# Patient Record
Sex: Female | Born: 1935 | Race: White | Hispanic: No | Smoking: Never smoker
Health system: Southern US, Community
[De-identification: ages and names within clinical notes are randomized; demographics above are authoritative.]

## PROBLEM LIST (undated history)

## (undated) DIAGNOSIS — G2581 Restless legs syndrome: Secondary | ICD-10-CM

## (undated) DIAGNOSIS — I878 Other specified disorders of veins: Secondary | ICD-10-CM

## (undated) DIAGNOSIS — E785 Hyperlipidemia, unspecified: Secondary | ICD-10-CM

## (undated) DIAGNOSIS — I214 Non-ST elevation (NSTEMI) myocardial infarction: Secondary | ICD-10-CM

## (undated) DIAGNOSIS — E119 Type 2 diabetes mellitus without complications: Secondary | ICD-10-CM

## (undated) DIAGNOSIS — R42 Dizziness and giddiness: Secondary | ICD-10-CM

## (undated) DIAGNOSIS — M1990 Unspecified osteoarthritis, unspecified site: Secondary | ICD-10-CM

## (undated) DIAGNOSIS — I2510 Atherosclerotic heart disease of native coronary artery without angina pectoris: Secondary | ICD-10-CM

## (undated) DIAGNOSIS — N3941 Urge incontinence: Secondary | ICD-10-CM

## (undated) DIAGNOSIS — K219 Gastro-esophageal reflux disease without esophagitis: Secondary | ICD-10-CM

## (undated) DIAGNOSIS — Z8673 Personal history of transient ischemic attack (TIA), and cerebral infarction without residual deficits: Secondary | ICD-10-CM

## (undated) DIAGNOSIS — M8580 Other specified disorders of bone density and structure, unspecified site: Secondary | ICD-10-CM

## (undated) DIAGNOSIS — D649 Anemia, unspecified: Secondary | ICD-10-CM

## (undated) DIAGNOSIS — I5042 Chronic combined systolic (congestive) and diastolic (congestive) heart failure: Secondary | ICD-10-CM

## (undated) DIAGNOSIS — K449 Diaphragmatic hernia without obstruction or gangrene: Secondary | ICD-10-CM

## (undated) DIAGNOSIS — I10 Essential (primary) hypertension: Secondary | ICD-10-CM

## (undated) DIAGNOSIS — E1121 Type 2 diabetes mellitus with diabetic nephropathy: Secondary | ICD-10-CM

## (undated) DIAGNOSIS — R58 Hemorrhage, not elsewhere classified: Secondary | ICD-10-CM

## (undated) DIAGNOSIS — I639 Cerebral infarction, unspecified: Secondary | ICD-10-CM

## (undated) HISTORY — PX: LUMBAR LAMINECTOMY: SHX95

## (undated) HISTORY — DX: Hyperlipidemia, unspecified: E78.5

## (undated) HISTORY — DX: Restless legs syndrome: G25.81

## (undated) HISTORY — DX: Anemia, unspecified: D64.9

## (undated) HISTORY — PX: KNEE ARTHROSCOPY: SUR90

## (undated) HISTORY — PX: SPINE SURGERY: SHX786

## (undated) HISTORY — DX: Diaphragmatic hernia without obstruction or gangrene: K44.9

## (undated) HISTORY — DX: Type 2 diabetes mellitus without complications: E11.9

## (undated) HISTORY — DX: Unspecified osteoarthritis, unspecified site: M19.90

## (undated) HISTORY — DX: Dizziness and giddiness: R42

## (undated) HISTORY — DX: Urge incontinence: N39.41

## (undated) HISTORY — DX: Other specified disorders of bone density and structure, unspecified site: M85.80

## (undated) HISTORY — DX: Gastro-esophageal reflux disease without esophagitis: K21.9

## (undated) HISTORY — DX: Other specified disorders of veins: I87.8

## (undated) HISTORY — DX: Non-ST elevation (NSTEMI) myocardial infarction: I21.4

## (undated) HISTORY — DX: Chronic combined systolic (congestive) and diastolic (congestive) heart failure: I50.42

## (undated) HISTORY — DX: Cerebral infarction, unspecified: I63.9

## (undated) HISTORY — DX: Personal history of transient ischemic attack (TIA), and cerebral infarction without residual deficits: Z86.73

## (undated) HISTORY — DX: Atherosclerotic heart disease of native coronary artery without angina pectoris: I25.10

## (undated) HISTORY — DX: Type 2 diabetes mellitus with diabetic nephropathy: E11.21

## (undated) HISTORY — DX: Essential (primary) hypertension: I10

---

## 1978-07-27 HISTORY — PX: TOTAL ABDOMINAL HYSTERECTOMY: SHX209

## 1986-07-27 HISTORY — PX: BLADDER REPAIR: SHX76

## 1989-07-27 HISTORY — PX: NECK SURGERY: SHX720

## 1991-07-28 DIAGNOSIS — Z8673 Personal history of transient ischemic attack (TIA), and cerebral infarction without residual deficits: Secondary | ICD-10-CM

## 1991-07-28 HISTORY — DX: Personal history of transient ischemic attack (TIA), and cerebral infarction without residual deficits: Z86.73

## 2006-05-27 LAB — HM MAMMOGRAPHY: HM Mammogram: NORMAL

## 2011-09-01 ENCOUNTER — Ambulatory Visit (INDEPENDENT_AMBULATORY_CARE_PROVIDER_SITE_OTHER): Payer: MEDICARE | Admitting: Internal Medicine

## 2011-09-01 ENCOUNTER — Encounter: Payer: Self-pay | Admitting: Internal Medicine

## 2011-09-01 VITALS — BP 160/50 | HR 72 | Temp 97.4°F | Wt 184.0 lb

## 2011-09-01 DIAGNOSIS — I2510 Atherosclerotic heart disease of native coronary artery without angina pectoris: Secondary | ICD-10-CM | POA: Insufficient documentation

## 2011-09-01 DIAGNOSIS — E118 Type 2 diabetes mellitus with unspecified complications: Secondary | ICD-10-CM

## 2011-09-01 DIAGNOSIS — I10 Essential (primary) hypertension: Secondary | ICD-10-CM | POA: Insufficient documentation

## 2011-09-01 DIAGNOSIS — G2581 Restless legs syndrome: Secondary | ICD-10-CM | POA: Insufficient documentation

## 2011-09-01 DIAGNOSIS — M19019 Primary osteoarthritis, unspecified shoulder: Secondary | ICD-10-CM

## 2011-09-01 DIAGNOSIS — I219 Acute myocardial infarction, unspecified: Secondary | ICD-10-CM

## 2011-09-01 DIAGNOSIS — M12819 Other specific arthropathies, not elsewhere classified, unspecified shoulder: Secondary | ICD-10-CM | POA: Insufficient documentation

## 2011-09-01 MED ORDER — AMLODIPINE BESYLATE 10 MG PO TABS: 10.0000 mg | ORAL_TABLET | Freq: Every day | ORAL | Status: DC

## 2011-09-01 NOTE — Progress Notes (Signed)
Subjective:    Patient ID: Sharon [name], female    DOB: May 03, 1916, 78 y.o.   MRN: [id number]  HPI    Review of Systems     Objective:   Physical Exam        Assessment & Plan:   Coronary artery disease, occlusive In my prior cath 3 years ago during admission for exertional angina. Her disease is managed medically due to renal insufficiency and age. I have discussed with the son that there are no medications on her list that can be suspended at this time. She takes both Ranexa and Imdur for  management of ischemic heart disease. Ejection fraction is unknown but by history she has class III heart failure with no symptoms at rest with some symptoms with exertion. She was previously followed by cardiology Dr. Marigene Ehlers her but given the optimization of her medications I agree with him that there is no immediately for cardiology follow up here.  Diabetes mellitus type 2 with complications With nephropathy, renal insufficiency, and coronary artery disease. Her last hemoglobin A1c was elevated at 8.0 3 months ago. She has been taking  Lantus with good control of fasting blood sugars. She is on the appropriate medications including aspirin statin but no ACE inhibitor or ARB for unclear reasons. We will repeat her A1c today.  Rotator cuff arthropathy Chronic with periodic flareups involving pain and limited range of motion. She is wearing a sling for her right shoulder but patient preference. I've cautioned her that continued use of the sling without physical therapy will result frozen shoulder. Her son will help her to stretching exercises and pendulum swings to prevent this. 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   Hypertension Currently uncontrolled on Toprol-XL Catapres patch hydralazine and furosemide. For simplification Will stop the hydralazine which he is taking 4 times a day,  and initiate trial of amlodipine 10 mg daily.  Restless leg syndrome Managed with pramipexole. Condition is stable she's not having a trouble sleeping. No changes today.    Updated Medication List Outpatient Encounter Prescriptions as of 09/01/2011  Medication Sig Dispense Refill  . aspirin 81 MG tablet Take 81 mg by mouth daily.      Marland Kitchen atorvastatin (LIPITOR) 20 MG tablet Take 20 mg by mouth daily.      . cloNIDine (CATAPRES - DOSED IN MG/24 HR) 0.3 mg/24hr Place 1 patch onto the skin once a week.      . furosemide (LASIX) 40 MG tablet Take 40 mg by mouth daily.      . insulin glargine (LANTUS) 100 UNIT/ML injection Inject 18 units daily      . isosorbide mononitrate (IMDUR) 60 MG 24 hr tablet Take one and one half by mouth daily      . metoprolol tartrate (LOPRESSOR) 25 MG tablet Take 25 mg by mouth 3 (three) times daily.      Marland Kitchen omeprazole (PRILOSEC) 20 MG capsule Take 20 mg by mouth daily.      . pramipexole (MIRAPEX) 0.25 MG tablet Take 0.25 mg by mouth 4 (four) times daily.       . ranolazine (RANEXA) 500 MG 12 hr tablet Take 500 mg by mouth 2 (two) times daily.      Marland Kitchen DISCONTD: hydrALAZINE (APRESOLINE) 25 MG tablet Take 25 mg by mouth 4 (four) times daily.      Marland Kitchen DISCONTD: metoprolol succinate (  TOPROL-XL) 100 MG 24 hr tablet Take one half tablet by mouth daily      . amLODipine (NORVASC) 10 MG tablet Take 1 tablet (10 mg total) by mouth daily.  30 tablet  3  . mometasone (NASONEX) 50 MCG/ACT nasal spray Place 2 sprays into the nose daily.      Marland Kitchen DISCONTD: simvastatin (ZOCOR) 40 MG tablet Take 40 mg by mouth every evening.

## 2011-09-01 NOTE — Assessment & Plan Note (Signed)
Managed with pramipexole. Condition is stable she's not having a trouble sleeping. No changes today.

## 2011-09-01 NOTE — Assessment & Plan Note (Signed)
Currently uncontrolled on Toprol-XL Catapres patch hydralazine and furosemide. For simplification Will stop the hydralazine which he is taking 4 times a day, and initiate trial of amlodipine 10 mg daily.

## 2011-09-01 NOTE — Assessment & Plan Note (Addendum)
With nephropathy, renal insufficiency, and coronary artery disease. Her last hemoglobin A1c was elevated at 8.0 3 months ago. She has been taking  Lantus with good control of fasting blood sugars. She is on the appropriate medications including aspirin statin but no ACE inhibitor or ARB for unclear reasons. We will repeat her A1c today.

## 2011-09-01 NOTE — Assessment & Plan Note (Signed)
Chronic with periodic flareups involving pain and limited range of motion. She is wearing a sling for her right shoulder but patient preference. I've cautioned her that continued use of the sling without physical therapy will result frozen shoulder. Her son will help her to stretching exercises and pendulum swings to prevent this. 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

## 2011-09-01 NOTE — Progress Notes (Signed)
  Subjective:    Patient ID: Sharon [name], female    DOB: 08/09/1915, 78 y.o.   MRN: [id number]  HPI  New patient ,  Woke up with shoulder pain , right side chroic recurrent due to rotator cuff .  Wearing a sling today.  Relocated form [place] area in November,  Living with son [name] at [facility],  Has lost 8 lbs  Through walking and better diet.  Right knee pain ,  Chronic treated with steroid injections ,  Son requesting referral to [facility].  Histoor of macular degeneration ,  Set up with [facility] Dr. Renaee Munda.  Cardiac history ;  2010; admitted to hospital with  chf secondary to 3 vessel disease in LAD, not stentable, recommended CABG at age 34 . But decompensated with renal failure, so medically managed by PCP and cardiologist Naseer , requesting centralization of management by me.   Gave up driving 3 months ago  But performing all ADLs .  Wears a hearing  aid.  Uses Walker into shower.  Wears Art therapist.  Has a home health aide scheduled to come out during son's absence for a few days.   Now on Lantus only for management of diabetes 20 units daily .  Needs podiatry referral for diabbetic foot care.    Review of Systems  Constitutional: Negative for fever, chills and unexpected weight change.  HENT: Negative for hearing loss, ear pain, nosebleeds, congestion, sore throat, facial swelling, rhinorrhea, sneezing, mouth sores, trouble swallowing, neck pain, neck stiffness, voice change, postnasal drip, sinus pressure, tinnitus and ear discharge.   Eyes: Negative for pain, discharge, redness and visual disturbance.  Respiratory: Negative for cough, chest tightness, shortness of breath, wheezing and stridor.   Cardiovascular: Negative for chest pain, palpitations and leg swelling.  Musculoskeletal: Positive for arthralgias. Negative for myalgias.  Skin: Negative for color change and rash.  Neurological: Negative for dizziness, weakness, light-headedness and headaches.  Hematological:  Negative for adenopathy.       Objective:   Physical Exam  Constitutional: She is oriented to person, place, and time. She appears well-developed and well-nourished.  HENT:  Mouth/Throat: Oropharynx is clear and moist.  Eyes: EOM are normal. Pupils are equal, round, and reactive to light. No scleral icterus.  Neck: Normal range of motion. Neck supple. No JVD present. No thyromegaly present.  Cardiovascular: Normal rate, regular rhythm, normal heart sounds and intact distal pulses.   Pulmonary/Chest: Effort normal and breath sounds normal.  Abdominal: Soft. Bowel sounds are normal. She exhibits no mass. There is no tenderness.  Musculoskeletal: Normal range of motion. She exhibits no edema.  Lymphadenopathy:    She has no cervical adenopathy.  Neurological: She is alert and oriented to person, place, and time.  Skin: Skin is warm and dry.  Psychiatric: She has a normal mood and affect.          Assessment & Plan:

## 2011-09-01 NOTE — Assessment & Plan Note (Signed)
In my prior cath 3 years ago during admission for exertional angina. Her disease is managed medically due to renal insufficiency and age. I have discussed with the son that there are no medications on her list that can be suspended at this time. She takes both Ranexa and Imdur for  management of ischemic heart disease. Ejection fraction is unknown but by history she has class III heart failure with no symptoms at rest with some symptoms with exertion. She was previously followed by cardiology Dr. Marigene Ehlers her but given the optimization of her medications I agree with him that there is no immediately for cardiology follow up here.

## 2011-09-01 NOTE — Patient Instructions (Signed)
We will make one change today:  Replace hydralazine with once daily amlodipine 10 mg one tablet daily   We are checking kidney function and diabetes control today.

## 2011-09-02 LAB — COMPREHENSIVE METABOLIC PANEL
Albumin: 3.6 g/dL (ref 3.5–5.2)
Alkaline Phosphatase: 82 U/L (ref 39–117)
BUN: 31 mg/dL — ABNORMAL HIGH (ref 6–23)
CO2: 29 mEq/L (ref 19–32)
Calcium: 8.8 mg/dL (ref 8.4–10.5)
GFR: 22.22 mL/min — ABNORMAL LOW (ref >60.00)
Glucose, Bld: 170 mg/dL — ABNORMAL HIGH (ref 70–99)
Potassium: 4.2 mEq/L (ref 3.5–5.1)
Sodium: 140 mEq/L (ref 135–145)
Total Protein: 6.1 g/dL (ref 6.0–8.3)

## 2011-09-10 ENCOUNTER — Telehealth: Payer: Self-pay | Admitting: *Deleted

## 2011-09-10 DIAGNOSIS — M25519 Pain in unspecified shoulder: Secondary | ICD-10-CM

## 2011-09-10 NOTE — Telephone Encounter (Signed)
Pt saw Dr. Hyacinth Meeker and was given a steroid injection her shoulder.  She wanted to start her on anti inflammatory but pt told him you had told her not to take anti inflammatories.  Son is asking for your opinion on this.  Please advise.

## 2011-09-11 MED ORDER — PREDNISONE (PAK) 10 MG PO TABS: ORAL_TABLET | ORAL | Status: AC

## 2011-09-11 NOTE — Telephone Encounter (Signed)
Advised pt's son.

## 2011-09-11 NOTE — Telephone Encounter (Signed)
Advised pt's son.  He said that Dr. Hyacinth Meeker wants you to prescribe what you think would be best for the patient to take, since you are her PCP.  Uses [facility].

## 2011-09-11 NOTE — Telephone Encounter (Signed)
I recommended No NSAIDs because they can worsen her  kidney disease.  prednsione ok to take thought for short periods

## 2011-09-11 NOTE — Telephone Encounter (Signed)
i have sent a prednisone taper to her [facility],  She may take tylenol 500 mg every hours along with the prednisone.  plesae explain the taper to the son,

## 2011-10-12 ENCOUNTER — Other Ambulatory Visit: Payer: Self-pay | Admitting: Internal Medicine

## 2011-10-12 MED ORDER — PRAMIPEXOLE DIHYDROCHLORIDE 0.25 MG PO TABS: 0.2500 mg | ORAL_TABLET | Freq: Four times a day (QID) | ORAL | Status: DC

## 2011-10-12 NOTE — Telephone Encounter (Signed)
Patient is needing a refill on he rPramipexole Di Hcl 0.25 mg

## 2011-10-12 NOTE — Telephone Encounter (Signed)
Rx has been sent  

## 2011-10-26 ENCOUNTER — Telehealth: Payer: Self-pay | Admitting: Internal Medicine

## 2011-10-26 ENCOUNTER — Ambulatory Visit (INDEPENDENT_AMBULATORY_CARE_PROVIDER_SITE_OTHER): Payer: MEDICARE | Admitting: Internal Medicine

## 2011-10-26 ENCOUNTER — Encounter: Payer: Self-pay | Admitting: Internal Medicine

## 2011-10-26 VITALS — BP 130/52 | HR 68 | Temp 98.5°F | Resp 18 | Wt 190.5 lb

## 2011-10-26 DIAGNOSIS — I10 Essential (primary) hypertension: Secondary | ICD-10-CM

## 2011-10-26 DIAGNOSIS — R609 Edema, unspecified: Secondary | ICD-10-CM

## 2011-10-26 DIAGNOSIS — R600 Localized edema: Secondary | ICD-10-CM

## 2011-10-26 DIAGNOSIS — Z79899 Other long term (current) drug therapy: Secondary | ICD-10-CM

## 2011-10-26 DIAGNOSIS — R0609 Other forms of dyspnea: Secondary | ICD-10-CM

## 2011-10-26 LAB — TSH: TSH: 1.24 u[IU]/mL (ref 0.35–5.50)

## 2011-10-26 MED ORDER — AZILSARTAN MEDOXOMIL 40 MG PO TABS: 1.0000 | ORAL_TABLET | Freq: Every day | ORAL | Status: DC

## 2011-10-26 MED ORDER — FUROSEMIDE 40 MG PO TABS: 80.0000 mg | ORAL_TABLET | Freq: Every day | ORAL | Status: DC

## 2011-10-26 NOTE — Progress Notes (Signed)
Patient ID: Sharon [name], female   DOB: 08-31-1915, 78 y.o.   MRN: [id number]  Patient Active Problem List  Diagnoses  . Coronary artery disease, occlusive  . Diabetes mellitus type 2 with complications  . Rotator cuff arthropathy  . Hypertension  . Restless leg syndrome  . Edema of lower extremity  . Chronic kidney disease (CKD), stage III (moderate)    Subjective:  CC:   Chief Complaint  Patient presents with  . Leg Swelling    HPI:   Sharon Woerner Christmasis a 78 y.o. female who presents with bilateral lower extremity edema.  She has had 6 lb wt gain in the past 7 days and her abdomen feels distended.  She denies orthopnea and chest pain.Marland Kitchen  Has chronic dyspnea on exertion.  She takes  amlodiine and imdur which both may be contributing. She was inactive and sedentary while son was out of town. She has increased the furosemide to 80 mg daily for 3 days which has increased her urine output.    Past Medical History  Diagnosis Date  . Diabetes mellitus, type 2   . Diabetic nephropathy   . GERD (gastroesophageal reflux disease)   . Essential hypertension, benign   . Coronary atherosclerosis of native coronary artery   . Venous stasis   . Osteoarthritis     with chronic back pain  . Osteopenia   . Urge incontinence     mild  . History of CVA (cerebrovascular accident) 38  . Vitamin d deficiency   . Hyperlipidemia   . Vertigo   . Restless leg syndrome   . Anemia     Past Surgical History  Procedure Date  . Bladder repair 1988  . Total abdominal hysterectomy 1980  . Neck surgery 1991  . Lumbar laminectomy   . Knee arthroscopy   . Spine surgery     lumbar laminectomy         The following portions of the patient's history were reviewed and updated as appropriate: Allergies, current medications, and problem list.    Review of Systems:   12 Pt  review of systems was negative except those addressed in the HPI,     History   Social History  . Marital  Status: Widowed    Spouse Name: N/A    Number of Children: N/A  . Years of Education: N/A   Occupational History  . Not on file.   Social History Main Topics  . Smoking status: Never Smoker   . Smokeless tobacco: Never Used  . Alcohol Use: No  . Drug Use: No  . Sexually Active: Not on file   Other Topics Concern  . Not on file   Social History Narrative  . No narrative on file    Objective:  BP 130/52  Pulse 68  Temp(Src) 98.5 F (36.9 C) (Oral)  Resp 18  Wt 190 lb 8 oz (86.41 kg)  SpO2 94%  General appearance: alert, cooperative and appears stated age Ears: normal TM's and external ear canals both ears Throat: lips, mucosa, and tongue normal; teeth and gums normal Neck: no adenopathy, no carotid bruit, supple, symmetrical, trachea midline and thyroid not enlarged, symmetric, no tenderness/mass/nodules Back: symmetric, no curvature. ROM normal. No CVA tenderness. Lungs: clear to auscultation bilaterally Heart: regular rate and rhythm, S1, S2 normal, no murmur, click, rub or gallop Abdomen: soft, non-tender; bowel sounds normal; no masses,  no organomegaly Pulses: 2+ and symmetric Skin: 2 + pitting edema bilaterally, left leg  erythematous. Lymph nodes: Cervical, supraclavicular, and axillary nodes normal.  Assessment and Plan:  Edema of lower extremity She has chronic venous insufficiency which has been aggravated by inactivity, vasodilator therapy and intolerance of compression stockings . Will increased furosemide to 80 mg once daily, check renal function today,  Stop amlodipine ,  Trial of edarbi (2 weeks of samples given) and ask [facility] to place Unnas boots on patient to manually compress the edema.   Hypertension Well controlled, but the amlodipine may be causing increased LE edema.  Trial of edarbi and suspend amlodipine.   Chronic kidney disease (CKD), stage III (moderate) Cr today was 2.2,  Up from 1.9  Previously secondary to increased diuretics. Will  repeat bmet in one week.    Updated Medication List Outpatient Encounter Prescriptions as of 10/26/2011  Medication Sig Dispense Refill  . amLODipine (NORVASC) 10 MG tablet Take 1 tablet (10 mg total) by mouth daily.  30 tablet  3  . aspirin 81 MG tablet Take 81 mg by mouth daily.      Marland Kitchen atorvastatin (LIPITOR) 20 MG tablet Take 20 mg by mouth daily.      . cloNIDine (CATAPRES - DOSED IN MG/24 HR) 0.3 mg/24hr Place 1 patch onto the skin once a week.      . furosemide (LASIX) 40 MG tablet Take 2 tablets (80 mg total) by mouth daily.  30 tablet  1  . insulin glargine (LANTUS) 100 UNIT/ML injection Inject 18 units daily      . isosorbide mononitrate (IMDUR) 60 MG 24 hr tablet Take one and one half by mouth daily      . metoprolol tartrate (LOPRESSOR) 25 MG tablet Take 25 mg by mouth 3 (three) times daily.      . mometasone (NASONEX) 50 MCG/ACT nasal spray Place 2 sprays into the nose daily.      Marland Kitchen omeprazole (PRILOSEC) 20 MG capsule Take 20 mg by mouth daily.      . pramipexole (MIRAPEX) 0.25 MG tablet Take 1 tablet (0.25 mg total) by mouth 4 (four) times daily.  120 tablet  3  . ranolazine (RANEXA) 500 MG 12 hr tablet Take 500 mg by mouth 2 (two) times daily.      Marland Kitchen DISCONTD: furosemide (LASIX) 40 MG tablet Take 40 mg by mouth daily.      . Azilsartan Medoxomil (EDARBI) 40 MG TABS Take 1 capsule by mouth daily.  30 tablet  0     Orders Placed This Encounter  Procedures  . TSH  . COMPLETE METABOLIC PANEL WITH GFR  . B Nat Peptide  . Ambulatory referral to Home Health    No Follow-up on file.

## 2011-10-26 NOTE — Assessment & Plan Note (Signed)
Well controlled, but the amlodipine may be causing increased LE edema.  Trial of edarbi and suspend amlodipine.

## 2011-10-26 NOTE — Assessment & Plan Note (Signed)
She has chronic venous insufficiency which has been aggravated by inactivity, vasodilator therapy and intolerance of compression stockings . Will increased furosemide to 80 mg once daily, check renal function today,  Stop amlodipine ,  Trial of edarbi (2 weeks of samples given) and ask [facility] to place Unnas boots on patient to manually compress the edema.

## 2011-10-26 NOTE — Telephone Encounter (Signed)
Per Dr. Darrick Huntsman, she does not want to wait until next week for [facility] to see patient.  I will call [facility] in the morning.

## 2011-10-26 NOTE — Patient Instructions (Signed)
Increase the furosemide to 80 mg in the morning  Stop the amlodipine  And add a new bp medication edarbi 1 tablet daily at night   Elevate legs (ankles higher than knees) when sitting  I am going to ask  [facility] to come out to wrap both legs  With compression wraps.    Resume walking as tolerating  [facility] can repeat a BMET in one week

## 2011-10-26 NOTE — Assessment & Plan Note (Signed)
Cr today was 2.2,  Up from 1.9  Previously secondary to increased diuretics. Will repeat bmet in one week.

## 2011-10-26 NOTE — Telephone Encounter (Signed)
Melissa from [facility] called and stated they cannot start care with patient today because they are backed up and it may be next week before they can make the initial visit with patient.  She wanted to know if it was ok to wait.  Please advise.

## 2011-10-27 LAB — COMPLETE METABOLIC PANEL WITH GFR
ALT: 17 U/L (ref 0–35)
AST: 20 U/L (ref 0–37)
Albumin: 3.7 g/dL (ref 3.5–5.2)
Calcium: 9.1 mg/dL (ref 8.4–10.5)
Chloride: 103 mEq/L (ref 96–112)
Potassium: 3.7 mEq/L (ref 3.5–5.3)
Sodium: 139 mEq/L (ref 135–145)
Total Protein: 5.6 g/dL — ABNORMAL LOW (ref 6.0–8.3)

## 2011-10-29 ENCOUNTER — Telehealth: Payer: Self-pay | Admitting: Internal Medicine

## 2011-10-29 NOTE — Telephone Encounter (Signed)
Per Son, [facility] did go out to patient's house to wrap her leg. They came out on MOnday and are due to go back today.  Patient also  has an aptt to see you tomorrow.

## 2011-10-29 NOTE — Telephone Encounter (Signed)
Caller: [facility]/Son; PCP: Duncan Dull; CB#: [phone]; ; ; Call regarding Swelling;   Son states patient was seen in office by Dr. Darrick Huntsman 10/26/11 for increased swelling in legs and weight gain.  States Lasix was increased from 40mg . to 80mg . X 3 days. Son states patient has gained 10 pounds in 2-3 weeks. Son states no weight loss since increasing Lasix. States no increase in urination with Lasix. Denies dyspnea or chest pain. States patient had to sit up to sleep during the night 10/29/11. States bilateral leg swelling extends to knees. States abdomen also appears to be swollen. Triage per Edema Protocol. No emergent sx identified. Care advice given per guidelines. No appts. available in Epic EHR. RN spoke with Erie Noe in office. Informed of disposition of see within 4 hours. Call transferred to [facility] in office for appt. within 4 hours.

## 2011-10-29 NOTE — Telephone Encounter (Signed)
Please confirm that [facility] did send a nurse out to wrap patient's legs in Unnas boots, and please ask them to go back out to see measure her pulses and listen to her lungs to make sure she is not in heart failure.

## 2011-10-29 NOTE — Telephone Encounter (Signed)
Triage Record Num: [id number] Operator: Patriciaann Clan Patient Name: Sharon [name] Call Date & Time: 10/29/2011 8:30:54AM Patient Phone: [phone] PCP: Duncan Dull Patient Gender: Female PCP Fax : [phone] Patient DOB: Sep 07, 1915 Practice Name: [facility] Reason for Call: Caller: [facility]/Son; PCP: Duncan Dull; CB#: [phone]; ; ; Call regarding Swelling; Son states patient was seen in office by Dr. Darrick Huntsman 10/26/11 for increased swelling in legs and weight gain. States Lasix was increased from 40mg . to 80mg . X 3 days. Son states patient has gained 10 pounds in 2-3 weeks. Son states no weight loss since increasing Lasix. States no increase in urination with Lasix. Denies dyspnea or chest pain. States patient had to sit up to sleep during the night 10/29/11. States bilateral leg swelling extends to knees. States abdomen also appears to be swollen. Triage per Edema Protocol. No emergent sx identified. Care advice given per guidelines. No appts. available in Epic EHR. RN spoke with Erie Noe in office. Informed of disposition of see within 4 hours. Call transferred to [facility] in office for appt. within 4 hours. Protocol(s) Used: Edema, Atraumatic Recommended Outcome per Protocol: See Provider within 4 hours Reason for Outcome: New or worsening fatigue, weakness, confusion, or change in function. Care Advice: ~ Avoid any activity that produces symptoms until evaluated by provider. ~ Rest in a reclining chair or elevate head and chest on 2 or 3 pillows when lying down to make breathing easier. Go to ED IMMEDIATELY if developing increased shortness of breath, continuous cough, worsening fatigue, or unable to perform ADLs. ~ ~ IMMEDIATE ACTION 04/

## 2011-10-30 ENCOUNTER — Encounter: Payer: Self-pay | Admitting: Internal Medicine

## 2011-10-30 ENCOUNTER — Telehealth: Payer: Self-pay | Admitting: Internal Medicine

## 2011-10-30 ENCOUNTER — Ambulatory Visit (INDEPENDENT_AMBULATORY_CARE_PROVIDER_SITE_OTHER): Payer: MEDICARE | Admitting: Internal Medicine

## 2011-10-30 VITALS — BP 110/46 | HR 60 | Temp 98.0°F | Resp 18 | Wt 196.5 lb

## 2011-10-30 DIAGNOSIS — I10 Essential (primary) hypertension: Secondary | ICD-10-CM

## 2011-10-30 DIAGNOSIS — R609 Edema, unspecified: Secondary | ICD-10-CM

## 2011-10-30 DIAGNOSIS — N183 Chronic kidney disease, stage 3 unspecified: Secondary | ICD-10-CM

## 2011-10-30 DIAGNOSIS — R600 Localized edema: Secondary | ICD-10-CM

## 2011-10-30 DIAGNOSIS — R0602 Shortness of breath: Secondary | ICD-10-CM

## 2011-10-30 DIAGNOSIS — N184 Chronic kidney disease, stage 4 (severe): Secondary | ICD-10-CM

## 2011-10-30 LAB — BASIC METABOLIC PANEL
BUN: 58 mg/dL — ABNORMAL HIGH (ref 6–23)
CO2: 25 mEq/L (ref 19–32)
Chloride: 102 mEq/L (ref 96–112)
Creatinine, Ser: 2.8 mg/dL — ABNORMAL HIGH (ref 0.4–1.2)
Glucose, Bld: 125 mg/dL — ABNORMAL HIGH (ref 70–99)

## 2011-10-30 MED ORDER — AZILSARTAN MEDOXOMIL 40 MG PO TABS: 1.0000 | ORAL_TABLET | Freq: Every day | ORAL | Status: DC

## 2011-10-30 MED ORDER — METOLAZONE 10 MG PO TABS: 10.0000 mg | ORAL_TABLET | Freq: Every day | ORAL | Status: DC

## 2011-10-30 NOTE — Patient Instructions (Signed)
We are adding zaroxyolyn as a diuretic to boost the effect of furosemide.  Just for today ,  Take Another  80 mg of furosemide preceded by the new drug (30 minutes prior At 12:00 )  Starting tomorrow,  take the z drug first,  Then 2 furosemide (80 mg) 30 minutes later   Referral to nephrology under way

## 2011-10-30 NOTE — Progress Notes (Signed)
Patient ID: Sharon [name], female   DOB: 1915-12-05, 78 y.o.   MRN: [id number] Returns for evaluation of worsening lower extremity edema.  Patient has a history of ischemic cardiomyopathy, class III NYHA CHF, HTN with probable diastolic dysfunction (ECHO report currently unavailable as I am still  waiting for records from her cardiologist), CKD with GFR around 30,  DM,  a chronic LE edema.  Patient was seen earlier in the week for weight gain and increased edema.  By history she had increased her furosemide to 40 mg bid for several days with an increase in urine output but no improvement in weight gain or in edema in legs and abdomen.  On prior  exam she had no signs of of decompensated congestive heart failure on exam.  BNP was < 500,  lung exam was normal and sats were normal. BUN was elevated at 50,.  Cr stable at 2.2   A 6 lb weight gain was noted compared to one month before.  She was prescribed 80 mg of furosmide once daily, amlodipine was stopped (edarbi was started for BP management)  and Home Health RN was enlilsted to wrap both legs in Unnas boots for compression of edema.  Patient returns today with continued weight gain but is short of breath with exertion ,  Not at rest.

## 2011-10-30 NOTE — Telephone Encounter (Signed)
[facility] called patient does not have her edarbi 40 mg and the pharmacy does not supply it at the facility, one of the [facility] staff members will come by the office to pick up samples to supply the patient until she is discharge on Tuesday.

## 2011-11-01 ENCOUNTER — Encounter: Payer: Self-pay | Admitting: Internal Medicine

## 2011-11-01 NOTE — Assessment & Plan Note (Signed)
Amlodipine was stopped several day ago secondary to concerns that it was aggravating her LE edema.  Samples of edarbi were given as a substitute.  GFR has dropped slightly but she has also been taking 80 mg of lasix daily.

## 2011-11-01 NOTE — Assessment & Plan Note (Signed)
Multifactorial, secondary to CKD, systolic dysfunction, probable venous insufficiency, and vasodilator therapy.  Her GFR has decreased with increased use of diuretics .  Adding zaroxolyn ,  Will ask [facility] to give IM lasix for weight gain > 1 lb overnight.  Nephrology referral underway as patient has GFR < 20 currently and desires aggressive care despite her age.

## 2011-11-01 NOTE — Assessment & Plan Note (Addendum)
With no prior nephrology evaluation.  Long discussion with her , son and daughter in law about her fluid balance complicated by CHF and CKD.  She desires aggressive care.  nephrology evaluation made. Cr did bump from 2.24 to 2.8 with stable potassium after addition of Edarbi.  Will repeat BMET on Monday April 8 and if Cr is higher will stop Edarbi and resume amlodipine.

## 2011-11-04 ENCOUNTER — Telehealth: Payer: Self-pay | Admitting: Internal Medicine

## 2011-11-04 ENCOUNTER — Other Ambulatory Visit: Payer: Self-pay | Admitting: Internal Medicine

## 2011-11-04 DIAGNOSIS — R609 Edema, unspecified: Secondary | ICD-10-CM

## 2011-11-04 NOTE — Telephone Encounter (Signed)
Dr Wynelle Link wanted to talk to you  About ms [place] [phone]

## 2011-11-05 ENCOUNTER — Telehealth: Payer: Self-pay | Admitting: Internal Medicine

## 2011-11-05 NOTE — Telephone Encounter (Signed)
Erie Noe from [place] called and wanted to know what size compression stockings did you want patient to wear please advise.

## 2011-11-05 NOTE — Telephone Encounter (Signed)
Whatever she can tolerate,  She probably will not tolerate more than 20-30 mm Hb

## 2011-11-06 NOTE — Telephone Encounter (Signed)
Vanessa notified.  She wanted to let you know patients diastolic BP when she was doing PT was in the 30-40 range, and when she has been to her house it has been in the 50s.   She stated patients pulse has been in the 50s as well.

## 2011-11-11 ENCOUNTER — Other Ambulatory Visit (INDEPENDENT_AMBULATORY_CARE_PROVIDER_SITE_OTHER): Payer: MEDICARE

## 2011-11-11 ENCOUNTER — Other Ambulatory Visit: Payer: Self-pay

## 2011-11-11 DIAGNOSIS — R609 Edema, unspecified: Secondary | ICD-10-CM

## 2011-11-24 ENCOUNTER — Telehealth: Payer: Self-pay | Admitting: Internal Medicine

## 2011-11-24 NOTE — Telephone Encounter (Signed)
Sharon [name] from [facility] called and said patient has not returned her phone calls or let [facility] come to her home in one week, so they will have to discharge her.

## 2011-11-30 ENCOUNTER — Encounter: Payer: Self-pay | Admitting: Internal Medicine

## 2011-11-30 ENCOUNTER — Ambulatory Visit (INDEPENDENT_AMBULATORY_CARE_PROVIDER_SITE_OTHER): Payer: MEDICARE | Admitting: Internal Medicine

## 2011-11-30 VITALS — BP 152/64 | HR 78 | Temp 98.2°F | Resp 18 | Wt 170.8 lb

## 2011-11-30 DIAGNOSIS — E1122 Type 2 diabetes mellitus with diabetic chronic kidney disease: Secondary | ICD-10-CM

## 2011-11-30 DIAGNOSIS — E1129 Type 2 diabetes mellitus with other diabetic kidney complication: Secondary | ICD-10-CM

## 2011-11-30 DIAGNOSIS — I219 Acute myocardial infarction, unspecified: Secondary | ICD-10-CM

## 2011-11-30 DIAGNOSIS — N186 End stage renal disease: Secondary | ICD-10-CM

## 2011-11-30 DIAGNOSIS — N058 Unspecified nephritic syndrome with other morphologic changes: Secondary | ICD-10-CM

## 2011-11-30 DIAGNOSIS — N184 Chronic kidney disease, stage 4 (severe): Secondary | ICD-10-CM

## 2011-11-30 DIAGNOSIS — I2510 Atherosclerotic heart disease of native coronary artery without angina pectoris: Secondary | ICD-10-CM

## 2011-11-30 NOTE — Progress Notes (Signed)
Patient ID: Sharon [name], female   DOB: 1915/10/22, 78 y.o.   MRN: [id number]  Patient Active Problem List  Diagnoses  . Coronary artery disease, occlusive  . Rotator cuff arthropathy  . Hypertension  . Restless leg syndrome  . Edema of lower extremity  . Chronic kidney disease (CKD), stage III (moderate)  . Diabetes mellitus with end-stage renal disease  . Chronic kidney disease (CKD), stage IV (severe)    Subjective:  CC:   Chief Complaint  Patient presents with  . Follow-up    HPI:   Sharon [name] Christmasis a 78 y.o. female who presents Follow up on NYHA Class III heart failure,  CKD,  And edema.  She has seen the nephrologist Kollaru,  twice, who agreed with the aggressive diuresis plan which achieved a 20 lb wt loss and has now reduced her diuretics to once daily furosemide and stopped the zaroxolyn,  ECHO was repeated and is pending.  Kidney ultrasound is scheduled in 2 weeks.  She feels much better, but continues to have some dyspnea with ambulation but not at rest.  Appetite good,  No pain reported.    Past Medical History  Diagnosis Date  . Diabetes mellitus, type 2   . Diabetic nephropathy   . GERD (gastroesophageal reflux disease)   . Essential hypertension, benign   . Coronary atherosclerosis of native coronary artery   . Venous stasis   . Osteoarthritis     with chronic back pain  . Osteopenia   . Urge incontinence     mild  . History of CVA (cerebrovascular accident) 53  . Vitamin d deficiency   . Hyperlipidemia   . Vertigo   . Restless leg syndrome   . Anemia     Past Surgical History  Procedure Date  . Bladder repair 1988  . Total abdominal hysterectomy 1980  . Neck surgery 1991  . Lumbar laminectomy   . Knee arthroscopy   . Spine surgery     lumbar laminectomy         The following portions of the patient's history were reviewed and updated as appropriate: Allergies, current medications, and problem list.    Review of Systems:   12 Pt  review of systems was negative except those addressed in the HPI,     History   Social History  . Marital Status: Widowed    Spouse Name: N/A    Number of Children: N/A  . Years of Education: N/A   Occupational History  . Not on file.   Social History Main Topics  . Smoking status: Never Smoker   . Smokeless tobacco: Never Used  . Alcohol Use: No  . Drug Use: No  . Sexually Active: Not on file   Other Topics Concern  . Not on file   Social History Narrative  . No narrative on file    Objective:  BP 152/64  Pulse 78  Temp(Src) 98.2 F (36.8 C) (Oral)  Resp 18  Wt 170 lb 12 oz (77.452 kg)  SpO2 97%  General appearance: alert, cooperative and appears stated age Ears: normal TM's and external ear canals both ears Throat: lips, mucosa, and tongue normal; teeth and gums normal Neck: no adenopathy, no carotid bruit, supple, symmetrical, trachea midline and thyroid not enlarged, symmetric, no tenderness/mass/nodules Back: symmetric, no curvature. ROM normal. No CVA tenderness. Lungs: clear to auscultation bilaterally Heart: regular rate and rhythm, S1, S2 normal, no murmur, click, rub or gallop Abdomen: soft, non-tender; bowel  sounds normal; no masses,  no organomegaly Pulses: 2+ and symmetric Skin: Skin color, texture, turgor normal. No rashes or lesions Lymph nodes: Cervical, supraclavicular, and axillary nodes normal.  Assessment and Plan:  Diabetes mellitus with end-stage renal disease Her GFR is around 20 and she intends to pursue HD if needed.  Nephrology is following.  Her sugars have been elevated lately , due to optical steroid injections, will increase Lantus to 22 units.  And adjust every 2 weeks.   Chronic kidney disease (CKD), stage IV (severe) Cr has been stable but her GFR is 20.  F/u with nephrology next week for renal ultrasound.   Coronary artery disease, occlusive 3 vessel disease,  CABG recommended by prior cardiologist but deferred due  to age. Severe systolic dysfunction suspected.  Repeat ECHO is pending. She is on the appropriate medications.    Updated Medication List Outpatient Encounter Prescriptions as of 11/30/2011  Medication Sig Dispense Refill  . aspirin 81 MG tablet Take 81 mg by mouth daily.      Marland Kitchen atorvastatin (LIPITOR) 20 MG tablet Take 20 mg by mouth daily.      . Azilsartan Medoxomil (EDARBI) 40 MG TABS Take 1 capsule by mouth daily.  30 tablet  0  . furosemide (LASIX) 40 MG tablet Take 2 tablets (80 mg total) by mouth daily.  30 tablet  1  . insulin glargine (LANTUS) 100 UNIT/ML injection Inject 18 units daily      . isosorbide mononitrate (IMDUR) 60 MG 24 hr tablet Take one and one half by mouth daily      . metolazone (ZAROXOLYN) 10 MG tablet Take 1 tablet (10 mg total) by mouth daily. Take 30 minutes prior to furosemide dose  30 tablet  1  . metoprolol tartrate (LOPRESSOR) 25 MG tablet Take 25 mg by mouth 3 (three) times daily.      Marland Kitchen omeprazole (PRILOSEC) 20 MG capsule Take 20 mg by mouth daily.      . pramipexole (MIRAPEX) 0.25 MG tablet Take 1 tablet (0.25 mg total) by mouth 4 (four) times daily.  120 tablet  3  . ranolazine (RANEXA) 500 MG 12 hr tablet Take 500 mg by mouth 2 (two) times daily.      Marland Kitchen DISCONTD: cloNIDine (CATAPRES - DOSED IN MG/24 HR) 0.3 mg/24hr Place 1 patch onto the skin once a week.      Marland Kitchen DISCONTD: mometasone (NASONEX) 50 MCG/ACT nasal spray Place 2 sprays into the nose daily.         No orders of the defined types were placed in this encounter.    Return in about 3 months (around 03/01/2012).

## 2011-11-30 NOTE — Patient Instructions (Signed)
Continue the fruit shakes with protein added; check your sugars 2 hrs afterward to see their effect on your blood sugar.   Change the Lantus to dinnertime and increase to 22 units until her blood sugars are consistently < 150 , then decrease to 18 units

## 2011-12-02 DIAGNOSIS — E1322 Other specified diabetes mellitus with diabetic chronic kidney disease: Secondary | ICD-10-CM | POA: Insufficient documentation

## 2011-12-02 DIAGNOSIS — N184 Chronic kidney disease, stage 4 (severe): Secondary | ICD-10-CM | POA: Insufficient documentation

## 2011-12-02 NOTE — Assessment & Plan Note (Addendum)
3 vessel disease,  CABG recommended by prior cardiologist but deferred due to age. Severe systolic dysfunction suspected.  Repeat ECHO is pending. She is on the appropriate medications.

## 2011-12-02 NOTE — Assessment & Plan Note (Addendum)
Her GFR is around 20 and she intends to pursue HD if needed.  Nephrology is following.  Her sugars have been elevated lately , due to optical steroid injections, will increase Lantus to 22 units.  And adjust every 2 weeks.

## 2011-12-02 NOTE — Assessment & Plan Note (Signed)
Cr has been stable but her GFR is 20.  F/u with nephrology next week for renal ultrasound.

## 2011-12-04 ENCOUNTER — Telehealth: Payer: Self-pay | Admitting: Internal Medicine

## 2011-12-04 NOTE — Telephone Encounter (Signed)
Patients son called and stated he thinks patient has increasing depression and wanted to know if you could add another medication or if he needed to make another appt for you to discuss this.  Please advise.

## 2011-12-07 NOTE — Telephone Encounter (Signed)
Would need another appt  Since we have not treated her for depression before.

## 2011-12-07 NOTE — Telephone Encounter (Signed)
appt made, patients son notified.

## 2011-12-08 ENCOUNTER — Other Ambulatory Visit: Payer: MEDICARE

## 2011-12-15 ENCOUNTER — Ambulatory Visit (INDEPENDENT_AMBULATORY_CARE_PROVIDER_SITE_OTHER): Payer: MEDICARE | Admitting: Internal Medicine

## 2011-12-15 ENCOUNTER — Encounter: Payer: Self-pay | Admitting: Internal Medicine

## 2011-12-15 VITALS — BP 124/48 | HR 78 | Temp 98.3°F | Resp 16 | Wt 181.5 lb

## 2011-12-15 DIAGNOSIS — R609 Edema, unspecified: Secondary | ICD-10-CM

## 2011-12-15 DIAGNOSIS — R600 Localized edema: Secondary | ICD-10-CM

## 2011-12-15 DIAGNOSIS — F4329 Adjustment disorder with other symptoms: Secondary | ICD-10-CM

## 2011-12-15 MED ORDER — METOLAZONE 10 MG PO TABS: 10.0000 mg | ORAL_TABLET | Freq: Every day | ORAL | Status: DC

## 2011-12-15 MED ORDER — MIRTAZAPINE 7.5 MG PO TABS: 7.5000 mg | ORAL_TABLET | Freq: Every day | ORAL | Status: DC

## 2011-12-15 NOTE — Patient Instructions (Addendum)
Increase your furosemide to 80 mg starting tomorrow  until your weight returns to 170.  I am adding a medication for your mood at bedtime.  Mirtazipine.

## 2011-12-15 NOTE — Progress Notes (Signed)
Patient ID: Sharon [name], female   DOB: 11-19-1915, 78 y.o.   MRN: [id number]  Patient Active Problem List  Diagnoses  . Coronary artery disease, occlusive  . Rotator cuff arthropathy  . Hypertension  . Restless leg syndrome  . Edema of lower extremity  . Chronic kidney disease (CKD), stage III (moderate)  . Diabetes mellitus with end-stage renal disease  . Chronic kidney disease (CKD), stage IV (severe)  . Adjustment disorder with physical complaints    Subjective:  CC:   Chief Complaint  Patient presents with  . Depression    HPI:   Sharon Nutting Christmasis a 78 y.o. female who presents with signs and symptoms noticed by son concerning for depression. Her son has reported decreased participation in activities requiring walking and housekeeping.  He has noticed some confusion at night. She sighs a lot and is clearly disappointed when son and wife go out of town, which they do often because of their active lifestyle and commitments.  Per patient,  Leaving her home in [place] to live with son had been a major adjustment.  She feels she has fewer social outlets and does not want to burden her son with increased responsibilities. Back in [place] she left the house with neighbors to go to a social event at least once a week,  She feels housebound.  Currently [name] insists that she make her bed. Not participating in walking with [facility] because of the LE edema and knee pain and stiffness.  She can take her own shower, but does not feelcomfortable staying by herself in their home bc she feels exposed and vulnerable.  She regrets moving in with them so early and wishes she had stayed in her home until the new house is built.  She does not feel depressed.  She enjoys reading, and when she spends a few days at [facility] (when [name] is out of town) she is very sociable.  I have never been depressed." .    Her weight has increased 9 lbs in the last 2 weeks since decreasing the diuretics to 40  mg furosemide daily.    Past Medical History  Diagnosis Date  . Diabetes mellitus, type 2   . Diabetic nephropathy   . GERD (gastroesophageal reflux disease)   . Essential hypertension, benign   . Coronary atherosclerosis of native coronary artery   . Venous stasis   . Osteoarthritis     with chronic back pain  . Osteopenia   . Urge incontinence     mild  . History of CVA (cerebrovascular accident) 23  . Vitamin d deficiency   . Hyperlipidemia   . Vertigo   . Restless leg syndrome   . Anemia     Past Surgical History  Procedure Date  . Bladder repair 1988  . Total abdominal hysterectomy 1980  . Neck surgery 1991  . Lumbar laminectomy   . Knee arthroscopy   . Spine surgery     lumbar laminectomy         The following portions of the patient's history were reviewed and updated as appropriate: Allergies, current medications, and problem list.    Review of Systems:   12 Pt  review of systems was negative except those addressed in the HPI,     History   Social History  . Marital Status: Widowed    Spouse Name: N/A    Number of Children: N/A  . Years of Education: N/A   Occupational History  .  Not on file.   Social History Main Topics  . Smoking status: Never Smoker   . Smokeless tobacco: Never Used  . Alcohol Use: No  . Drug Use: No  . Sexually Active: Not on file   Other Topics Concern  . Not on file   Social History Narrative  . No narrative on file    Objective:  BP 124/48  Pulse 78  Temp(Src) 98.3 F (36.8 C) (Oral)  Resp 16  Wt 181 lb 8 oz (82.328 kg)  SpO2 94%  General appearance: alert, cooperative and appears stated age Ears: normal TM's and external ear canals both ears Throat: lips, mucosa, and tongue normal; teeth and gums normal Neck: no adenopathy, no carotid bruit, supple, symmetrical, trachea midline and thyroid not enlarged, symmetric, no tenderness/mass/nodules Back: symmetric, no curvature. ROM normal. No CVA  tenderness. Lungs: clear to auscultation bilaterally Heart: regular rate and rhythm, S1, S2 normal, no murmur, click, rub or gallop Abdomen: soft, non-tender; bowel sounds normal; no masses,  no organomegaly Pulses: 2+ and symmetric Skin: Skin color, texture, turgor normal. No rashes or lesions Lymph nodes: Cervical, supraclavicular, and axillary nodes normal.  Assessment and Plan:  Edema of lower extremity Given her 9 lb wt gain, I have increased her furosemide to 80 mg daily for the next several days with instructions to continue weighing daily.  Goal wt is 170 lbs.    Adjustment disorder with physical complaints She lacks motivation currently to increase socialization and participation , partly due to pain complaints , partly due to change in living situation and loss of independence.  Trial of bedtime mirtazipine.  Return in 1 months.   A total of 60 minutes was spent with patient and family, > 50% in patient care.     Updated Medication List Outpatient Encounter Prescriptions as of 12/15/2011  Medication Sig Dispense Refill  . aspirin 81 MG tablet Take 81 mg by mouth daily.      Marland Kitchen atorvastatin (LIPITOR) 20 MG tablet Take 20 mg by mouth daily.      . Azilsartan Medoxomil (EDARBI) 40 MG TABS Take 1 capsule by mouth daily.  30 tablet  0  . ergocalciferol (VITAMIN D2) 50000 UNITS capsule Take 50,000 Units by mouth once a week.      . furosemide (LASIX) 40 MG tablet Take 2 tablets (80 mg total) by mouth daily.  30 tablet  1  . insulin glargine (LANTUS) 100 UNIT/ML injection Inject 18 units daily      . isosorbide mononitrate (IMDUR) 60 MG 24 hr tablet Take one and one half by mouth daily      . metoprolol tartrate (LOPRESSOR) 25 MG tablet Take 25 mg by mouth 3 (three) times daily.      Marland Kitchen omeprazole (PRILOSEC) 20 MG capsule Take 20 mg by mouth daily.      . pramipexole (MIRAPEX) 0.25 MG tablet Take 1 tablet (0.25 mg total) by mouth 4 (four) times daily.  120 tablet  3  . ranolazine  (RANEXA) 500 MG 12 hr tablet Take 500 mg by mouth 2 (two) times daily.      . metolazone (ZAROXOLYN) 10 MG tablet Take 1 tablet (10 mg total) by mouth daily. Take 30 minutes prior to furosemide dose  30 tablet  1  . mirtazapine (REMERON) 7.5 MG tablet Take 1 tablet (7.5 mg total) by mouth at bedtime.  30 tablet  1  . DISCONTD: metolazone (ZAROXOLYN) 10 MG tablet Take 1 tablet (10 mg total)  by mouth daily. Take 30 minutes prior to furosemide dose  30 tablet  1     No orders of the defined types were placed in this encounter.    No Follow-up on file.

## 2011-12-17 ENCOUNTER — Encounter: Payer: Self-pay | Admitting: Internal Medicine

## 2011-12-17 DIAGNOSIS — F4329 Adjustment disorder with other symptoms: Secondary | ICD-10-CM | POA: Insufficient documentation

## 2011-12-17 NOTE — Assessment & Plan Note (Signed)
She lacks motivation currently to increase socialization and participation , partly due to pain complaints , partly due to change in living situation and loss of independence.  Trial of bedtime mirtazipine.  Return in 1 months.

## 2011-12-17 NOTE — Assessment & Plan Note (Signed)
Given her 9 lb wt gain, I have increased her furosemide to 80 mg daily for the next several days with instructions to continue weighing daily.  Goal wt is 170 lbs.

## 2011-12-18 ENCOUNTER — Telehealth: Payer: Self-pay | Admitting: Internal Medicine

## 2011-12-18 ENCOUNTER — Other Ambulatory Visit: Payer: Self-pay | Admitting: Internal Medicine

## 2011-12-18 MED ORDER — INSULIN GLARGINE 100 UNIT/ML ~~LOC~~ SOLN: 18.0000 [IU] | Freq: Every day | SUBCUTANEOUS | Status: DC

## 2011-12-18 NOTE — Telephone Encounter (Signed)
Done via email per Dr. Vincente Liberty

## 2011-12-18 NOTE — Telephone Encounter (Signed)
Patient needs [facility] insulin pens [facility] [address]. Shari Heritage is going out of town states he requested 2 days ago.

## 2011-12-26 ENCOUNTER — Emergency Department: Payer: Self-pay | Admitting: Emergency Medicine

## 2012-01-13 ENCOUNTER — Other Ambulatory Visit: Payer: Self-pay | Admitting: Internal Medicine

## 2012-01-13 DIAGNOSIS — I10 Essential (primary) hypertension: Secondary | ICD-10-CM

## 2012-01-13 MED ORDER — AZILSARTAN MEDOXOMIL 40 MG PO TABS: 1.0000 | ORAL_TABLET | Freq: Every day | ORAL | Status: DC

## 2012-01-18 ENCOUNTER — Ambulatory Visit (INDEPENDENT_AMBULATORY_CARE_PROVIDER_SITE_OTHER): Payer: MEDICARE | Admitting: Internal Medicine

## 2012-01-18 ENCOUNTER — Encounter: Payer: Self-pay | Admitting: Internal Medicine

## 2012-01-18 VITALS — BP 128/60 | HR 70 | Temp 98.4°F | Ht 60.5 in | Wt 186.0 lb

## 2012-01-18 DIAGNOSIS — N184 Chronic kidney disease, stage 4 (severe): Secondary | ICD-10-CM

## 2012-01-18 DIAGNOSIS — M12819 Other specific arthropathies, not elsewhere classified, unspecified shoulder: Secondary | ICD-10-CM

## 2012-01-18 DIAGNOSIS — F4329 Adjustment disorder with other symptoms: Secondary | ICD-10-CM

## 2012-01-18 DIAGNOSIS — J069 Acute upper respiratory infection, unspecified: Secondary | ICD-10-CM

## 2012-01-18 DIAGNOSIS — I10 Essential (primary) hypertension: Secondary | ICD-10-CM

## 2012-01-18 DIAGNOSIS — M19019 Primary osteoarthritis, unspecified shoulder: Secondary | ICD-10-CM

## 2012-01-18 DIAGNOSIS — IMO0002 Reserved for concepts with insufficient information to code with codable children: Secondary | ICD-10-CM

## 2012-01-18 DIAGNOSIS — S86911A Strain of unspecified muscle(s) and tendon(s) at lower leg level, right leg, initial encounter: Secondary | ICD-10-CM

## 2012-01-18 MED ORDER — DEXTROMETHORPHAN POLISTIREX 30 MG/5ML PO LQCR: 60.0000 mg | ORAL | Status: AC | PRN

## 2012-01-18 NOTE — Progress Notes (Signed)
Patient ID: Sharon [name], female   DOB: [birth year], 78 y.o.   MRN: [id number] Patient Active Problem List  Diagnosis  . Coronary artery disease, occlusive  . Rotator cuff arthropathy  . Hypertension  . Restless leg syndrome  . Edema of lower extremity  . Chronic kidney disease (CKD), stage III (moderate)  . Diabetes mellitus with end-stage renal disease  . Chronic kidney disease (CKD), stage IV (severe)  . Adjustment disorder with physical complaints  . Viral URI with cough    Subjective:  CC:   Chief Complaint  Patient presents with  . Follow-up    HPI:   Sharon Coppa Christmasis a 78 y.o. female who presents for one month follow up on depression .  New onset URI symptoms. Has been coughing since last Wednesday,  No fevers, raspy cough and voice but no purulent sputum  No body aches. some wheezing.  Not using any otc meds for cough bc she was staying at [facility] while son was out of tow and had noting ordered.  No calls from facility seen in chart.  She was treated in ER recently for knee strain, fitted with an with immobilizer,  Saw Dr. Hyacinth Meeker , who injected the shoulder,nNot the knee. ,She has been receiving in home Pt at [facility] fore the knee sprain which has been improving her mobility. depressive symptoms have improved,  due to increased socialization while she is staying at [facility] , comes home on Sunday. Son trying to build house and sell two other houses (hers and theirs).  Has gained 5 lbs,  Not water weight,  On furosemide 80 mg qam.  Getting PT 3 days per week at [facility] whic has imporved her balance.    Past Medical History  Diagnosis Date  . Diabetes mellitus, type 2   . Diabetic nephropathy   . GERD (gastroesophageal reflux disease)   . Essential hypertension, benign   . Coronary atherosclerosis of native coronary artery   . Venous stasis   . Osteoarthritis     with chronic back pain  . Osteopenia   . Urge incontinence     mild  . History of CVA  (cerebrovascular accident) 65  . Vitamin d deficiency   . Hyperlipidemia   . Vertigo   . Restless leg syndrome   . Anemia     Past Surgical History  Procedure Date  . Bladder repair 1988  . Total abdominal hysterectomy 1980  . Neck surgery 1991  . Lumbar laminectomy   . Knee arthroscopy   . Spine surgery     lumbar laminectomy         The following portions of the patient's history were reviewed and updated as appropriate: Allergies, current medications, and problem list.    Review of Systems:  The rest of the comprehensive review of systems was negative except those addressed in the HPI,     History   Social History  . Marital Status: Widowed    Spouse Name: N/A    Number of Children: N/A  . Years of Education: N/A   Occupational History  . Not on file.   Social History Main Topics  . Smoking status: Never Smoker   . Smokeless tobacco: Never Used  . Alcohol Use: No  . Drug Use: No  . Sexually Active: Not on file   Other Topics Concern  . Not on file   Social History Narrative  . No narrative on file    Objective:  BP 128/60  Pulse 70  Temp 98.4 F (36.9 C) (Oral)  Ht 5' 0.5" (1.537 m)  Wt 186 lb (84.369 kg)  BMI 35.73 kg/m2  SpO2 95%  General appearance: alert, cooperative and appears stated age Ears: normal TM's and external ear canals both ears Throat: lips, mucosa, and tongue normal; teeth and gums normal Neck: no adenopathy, no carotid bruit, supple, symmetrical, trachea midline and thyroid not enlarged, symmetric, no tenderness/mass/nodules Back: symmetric, no curvature. ROM normal. No CVA tenderness. Lungs: clear to auscultation bilaterally Heart: regular rate and rhythm, S1, S2 normal, no murmur, click, rub or gallop Abdomen: soft, non-tender; bowel sounds normal; no masses,  no organomegaly Pulses: 2+ and symmetric Skin: Skin color, texture, turgor normal. No rashes or lesions,  No pitting edema Lymph nodes: Cervical,  supraclavicular, and axillary nodes normal.  Assessment and Plan: Hypertension Well controlled on current regimen. Renal function stable, no changes today.  Adjustment disorder with physical complaints Tolerating mirtazipine 7.5 mg , with recent improvement due to increased socialization. Contineu current dose, son instructed to increase to 15 mg if mood fails with return home .   Chronic kidney disease (CKD), stage IV (severe) Stable, with no signs of progression, good appetite.  No signs of edema on   exam.   Viral URI with cough Exam is benign..  rx delsym for cough.  antihistamine for nasal drainage.   Rotator cuff arthropathy Improved post steroid injection by Dr. Hyacinth Meeker.   Strain of knee and leg, right Negative radiologic findings per Hyacinth Meeker,  improved with PT,  No longer using immobilizer.    Updated Medication List Outpatient Encounter Prescriptions as of 01/18/2012  Medication Sig Dispense Refill  . aspirin 81 MG tablet Take 81 mg by mouth daily.      Marland Kitchen atorvastatin (LIPITOR) 20 MG tablet Take 20 mg by mouth daily.      . Azilsartan Medoxomil (EDARBI) 40 MG TABS Take 1 capsule by mouth daily.  30 tablet  3  . ergocalciferol (VITAMIN D2) 50000 UNITS capsule Take 50,000 Units by mouth once a week.      . furosemide (LASIX) 40 MG tablet Take 2 tablets (80 mg total) by mouth daily.  30 tablet  1  . insulin glargine (LANTUS SOLOSTAR) 100 UNIT/ML injection Inject 18 Units into the skin daily.  5 pen  PRN  . isosorbide mononitrate (IMDUR) 60 MG 24 hr tablet Take one and one half by mouth daily      . metolazone (ZAROXOLYN) 10 MG tablet Take 1 tablet (10 mg total) by mouth daily. Take 30 minutes prior to furosemide dose  30 tablet  1  . metoprolol tartrate (LOPRESSOR) 25 MG tablet Take 25 mg by mouth 3 (three) times daily.      . mirtazapine (REMERON) 7.5 MG tablet Take 7.5 mg by mouth at bedtime.      Marland Kitchen omeprazole (PRILOSEC) 20 MG capsule Take 20 mg by mouth daily.      .  pramipexole (MIRAPEX) 0.25 MG tablet Take 1 tablet (0.25 mg total) by mouth 4 (four) times daily.  120 tablet  3  . ranolazine (RANEXA) 500 MG 12 hr tablet Take 500 mg by mouth 2 (two) times daily.      Marland Kitchen DISCONTD: mirtazapine (REMERON) 7.5 MG tablet Take 1 tablet (7.5 mg total) by mouth at bedtime.  30 tablet  1  . dextromethorphan (DELSYM) 30 MG/5ML liquid Take 10 mLs (60 mg total) by mouth as needed for cough.  148  mL  3

## 2012-01-18 NOTE — Assessment & Plan Note (Signed)
Improved post steroid injection by Dr. Hyacinth Meeker.

## 2012-01-18 NOTE — Assessment & Plan Note (Signed)
Exam is benign..  rx delsym for cough.  antihistamine for nasal drainage.

## 2012-01-18 NOTE — Patient Instructions (Addendum)
You appear  to have a viral URI.   You can take Delsym for the cough.  You may use allegra or zyrtec or claritin for runny nose.   You may increase the mirtazipine to 15 mg at bedtime if needed to improve her mood and motivation.

## 2012-01-18 NOTE — Assessment & Plan Note (Addendum)
Tolerating mirtazipine 7.5 mg , with recent improvement due to increased socialization. Contineu current dose, son instructed to increase to 15 mg if mood fails with return home .

## 2012-01-18 NOTE — Assessment & Plan Note (Signed)
Well controlled on current regimen. Renal function stable, no changes today. 

## 2012-01-18 NOTE — Assessment & Plan Note (Signed)
Stable, with no signs of progression, good appetite.  No signs of edema on   exam.

## 2012-01-18 NOTE — Assessment & Plan Note (Signed)
Negative radiologic findings per Hyacinth Meeker,  improved with PT,  No longer using immobilizer.

## 2012-01-22 ENCOUNTER — Telehealth: Payer: Self-pay | Admitting: *Deleted

## 2012-01-22 NOTE — Telephone Encounter (Signed)
[facility] stating that PA is needed for Edarbi 40mg .  Called [phone] ([facility]) to get PA paper work faxed to our office.

## 2012-01-22 NOTE — Telephone Encounter (Signed)
PA given to Dr. Darrick Huntsman for completion.

## 2012-01-25 ENCOUNTER — Telehealth: Payer: Self-pay | Admitting: Internal Medicine

## 2012-01-25 MED ORDER — INSULIN GLARGINE 100 UNIT/ML ~~LOC~~ SOLN: SUBCUTANEOUS | Status: DC

## 2012-01-25 NOTE — Telephone Encounter (Signed)
Tried calling patient, but line was busy.

## 2012-01-25 NOTE — Telephone Encounter (Signed)
Her recent A1c per labs From Nephrologisit office is elevated at 8.6 .  I would like her to increase her Lantus dose from 18 units to 25 units and start checking blood sugars once or twice daily   , both fasting nad 2 hr post prandially.

## 2012-01-26 ENCOUNTER — Telehealth: Payer: Self-pay | Admitting: Internal Medicine

## 2012-01-26 NOTE — Telephone Encounter (Signed)
Pt son called stating that you need to call pt insurance company to get ok for her rx of edarbi prior [employer]

## 2012-01-26 NOTE — Telephone Encounter (Signed)
Prior Sharon [name] has already been done, patients son notified.  Pharmacy notified as well.

## 2012-01-27 NOTE — Telephone Encounter (Signed)
Son notified 

## 2012-02-02 ENCOUNTER — Inpatient Hospital Stay: Payer: Self-pay | Admitting: Internal Medicine

## 2012-02-02 DIAGNOSIS — I498 Other specified cardiac arrhythmias: Secondary | ICD-10-CM

## 2012-02-02 LAB — URINALYSIS, COMPLETE
Bilirubin,UR: NEGATIVE
Blood: NEGATIVE
Hyaline Cast: 9
Ketone: NEGATIVE
Ph: 5 (ref 4.5–8.0)
Protein: NEGATIVE
RBC,UR: 2 /HPF (ref 0–5)
Specific Gravity: 1.011 (ref 1.003–1.030)
Squamous Epithelial: 2

## 2012-02-02 LAB — BASIC METABOLIC PANEL
BUN: 67 mg/dL — ABNORMAL HIGH (ref 7–18)
Calcium, Total: 8.5 mg/dL (ref 8.5–10.1)
Chloride: 105 mmol/L (ref 98–107)
Co2: 26 mmol/L (ref 21–32)
EGFR (Non-African Amer.): 12 — ABNORMAL LOW
Glucose: 185 mg/dL — ABNORMAL HIGH (ref 65–99)
Osmolality: 302 (ref 275–301)
Potassium: 4.8 mmol/L (ref 3.5–5.1)

## 2012-02-02 LAB — COMPREHENSIVE METABOLIC PANEL
Albumin: 3.3 g/dL — ABNORMAL LOW (ref 3.4–5.0)
Alkaline Phosphatase: 114 U/L (ref 50–136)
Anion Gap: 5 — ABNORMAL LOW (ref 7–16)
BUN: 68 mg/dL — ABNORMAL HIGH (ref 7–18)
Bilirubin,Total: 0.4 mg/dL (ref 0.2–1.0)
EGFR (African American): 15 — ABNORMAL LOW
Glucose: 143 mg/dL — ABNORMAL HIGH (ref 65–99)
Osmolality: 292 (ref 275–301)
SGOT(AST): 20 U/L (ref 15–37)
SGPT (ALT): 21 U/L
Sodium: 135 mmol/L — ABNORMAL LOW (ref 136–145)
Total Protein: 6.7 g/dL (ref 6.4–8.2)

## 2012-02-02 LAB — PROTIME-INR
INR: 0.9
Prothrombin Time: 12.8 secs (ref 11.5–14.7)

## 2012-02-02 LAB — CBC
HCT: 35.1 % (ref 35.0–47.0)
HGB: 11.6 g/dL — ABNORMAL LOW (ref 12.0–16.0)
MCHC: 33.1 g/dL (ref 32.0–36.0)
RBC: 3.78 10*6/uL — ABNORMAL LOW (ref 3.80–5.20)
RDW: 14.7 % — ABNORMAL HIGH (ref 11.5–14.5)
WBC: 9.5 10*3/uL (ref 3.6–11.0)

## 2012-02-02 LAB — CK TOTAL AND CKMB (NOT AT ARMC): CK, Total: 41 U/L (ref 21–215)

## 2012-02-02 LAB — APTT: Activated PTT: 24 secs (ref 23.6–35.9)

## 2012-02-03 DIAGNOSIS — I369 Nonrheumatic tricuspid valve disorder, unspecified: Secondary | ICD-10-CM

## 2012-02-03 LAB — CBC WITH DIFFERENTIAL/PLATELET
Basophil #: 0 10*3/uL (ref 0.0–0.1)
Eosinophil #: 0.3 10*3/uL (ref 0.0–0.7)
HCT: 34.4 % — ABNORMAL LOW (ref 35.0–47.0)
HGB: 11.4 g/dL — ABNORMAL LOW (ref 12.0–16.0)
Lymphocyte %: 25 %
MCH: 30.6 pg (ref 26.0–34.0)
MCHC: 33.1 g/dL (ref 32.0–36.0)
MCV: 92 fL (ref 80–100)
Monocyte #: 0.5 x10 3/mm (ref 0.2–0.9)
Neutrophil #: 5.5 10*3/uL (ref 1.4–6.5)
Neutrophil %: 65.5 %
RBC: 3.72 10*6/uL — ABNORMAL LOW (ref 3.80–5.20)
RDW: 14.7 % — ABNORMAL HIGH (ref 11.5–14.5)
WBC: 8.4 10*3/uL (ref 3.6–11.0)

## 2012-02-03 LAB — BASIC METABOLIC PANEL
Anion Gap: 11 (ref 7–16)
BUN: 61 mg/dL — ABNORMAL HIGH (ref 7–18)
Calcium, Total: 8.6 mg/dL (ref 8.5–10.1)
Chloride: 104 mmol/L (ref 98–107)
Co2: 25 mmol/L (ref 21–32)
Creatinine: 2.68 mg/dL — ABNORMAL HIGH (ref 0.60–1.30)
EGFR (African American): 17 — ABNORMAL LOW
EGFR (Non-African Amer.): 15 — ABNORMAL LOW
Glucose: 148 mg/dL — ABNORMAL HIGH (ref 65–99)
Osmolality: 299 (ref 275–301)
Potassium: 5 mmol/L (ref 3.5–5.1)
Sodium: 140 mmol/L (ref 136–145)

## 2012-02-03 LAB — LIPID PANEL
Cholesterol: 152 mg/dL (ref 0–200)
HDL Cholesterol: 53 mg/dL (ref 40–60)
Ldl Cholesterol, Calc: 69 mg/dL (ref 0–100)
Triglycerides: 151 mg/dL (ref 0–200)
VLDL Cholesterol, Calc: 30 mg/dL (ref 5–40)

## 2012-02-03 LAB — CK TOTAL AND CKMB (NOT AT ARMC)
CK, Total: 47 U/L (ref 21–215)
CK-MB: 1.4 ng/mL (ref 0.5–3.6)

## 2012-02-03 LAB — HEMOGLOBIN A1C: Hemoglobin A1C: 8.3 % — ABNORMAL HIGH (ref 4.2–6.3)

## 2012-02-03 LAB — TSH: Thyroid Stimulating Horm: 1.7 u[IU]/mL

## 2012-02-04 LAB — BASIC METABOLIC PANEL
Anion Gap: 8 (ref 7–16)
Calcium, Total: 8.2 mg/dL — ABNORMAL LOW (ref 8.5–10.1)
Chloride: 109 mmol/L — ABNORMAL HIGH (ref 98–107)
Co2: 25 mmol/L (ref 21–32)
Creatinine: 2.07 mg/dL — ABNORMAL HIGH (ref 0.60–1.30)
EGFR (African American): 23 — ABNORMAL LOW
EGFR (Non-African Amer.): 20 — ABNORMAL LOW
Glucose: 67 mg/dL (ref 65–99)
Potassium: 4.3 mmol/L (ref 3.5–5.1)
Sodium: 142 mmol/L (ref 136–145)

## 2012-02-07 ENCOUNTER — Other Ambulatory Visit: Payer: Self-pay | Admitting: Internal Medicine

## 2012-02-07 NOTE — Telephone Encounter (Signed)
Dr. Encarnacion Chu office sent over labs from July 10 th.  Her potassium was slightly high.  Did he address it?  She needs to be on a low potassium diet.  Please see if his office has 1) addressed her potassium level with either medicatison or a repeat BMET this week and 2) ask them for a copy of their low potassium diet for patients with kideny disease.  hanks

## 2012-02-08 NOTE — Telephone Encounter (Signed)
Patient notified. She says that they did start her on a low potassium diet. She says that she is to repeat labs after starting the new diet. Called Dr. Anola Gurney office and requested a copy of their low potassium diet.

## 2012-02-17 ENCOUNTER — Ambulatory Visit (INDEPENDENT_AMBULATORY_CARE_PROVIDER_SITE_OTHER): Payer: MEDICARE | Admitting: Internal Medicine

## 2012-02-17 ENCOUNTER — Encounter: Payer: Self-pay | Admitting: Internal Medicine

## 2012-02-17 ENCOUNTER — Encounter: Payer: Self-pay | Admitting: Cardiovascular Disease

## 2012-02-17 VITALS — BP 138/64 | HR 106 | Temp 98.4°F | Resp 18 | Wt 187.0 lb

## 2012-02-17 DIAGNOSIS — I2510 Atherosclerotic heart disease of native coronary artery without angina pectoris: Secondary | ICD-10-CM

## 2012-02-17 DIAGNOSIS — F4329 Adjustment disorder with other symptoms: Secondary | ICD-10-CM

## 2012-02-17 DIAGNOSIS — E875 Hyperkalemia: Secondary | ICD-10-CM

## 2012-02-17 DIAGNOSIS — R079 Chest pain, unspecified: Secondary | ICD-10-CM

## 2012-02-17 DIAGNOSIS — N184 Chronic kidney disease, stage 4 (severe): Secondary | ICD-10-CM

## 2012-02-17 DIAGNOSIS — I10 Essential (primary) hypertension: Secondary | ICD-10-CM

## 2012-02-17 DIAGNOSIS — I219 Acute myocardial infarction, unspecified: Secondary | ICD-10-CM

## 2012-02-17 LAB — BASIC METABOLIC PANEL
GFR: 21.86 mL/min — ABNORMAL LOW (ref >60.00)
Glucose, Bld: 134 mg/dL — ABNORMAL HIGH (ref 70–99)
Potassium: 3.9 mEq/L (ref 3.5–5.1)
Sodium: 138 mEq/L (ref 135–145)

## 2012-02-17 MED ORDER — FAMOTIDINE 20 MG PO TABS: 20.0000 mg | ORAL_TABLET | Freq: Every day | ORAL | Status: DC

## 2012-02-17 MED ORDER — METOPROLOL TARTRATE 25 MG PO TABS: 12.5000 mg | ORAL_TABLET | Freq: Two times a day (BID) | ORAL | Status: DC

## 2012-02-17 MED ORDER — NITROGLYCERIN 0.4 MG SL SUBL: 0.4000 mg | SUBLINGUAL_TABLET | SUBLINGUAL | Status: DC | PRN

## 2012-02-17 NOTE — Assessment & Plan Note (Signed)
With recent episode of acute renal failure and mild hyperkalemia to 5.5. Repeat basic metabolic panel to be done today potassium restriction has been recommended.

## 2012-02-17 NOTE — Assessment & Plan Note (Signed)
We are stopping her mirtazapine today as the medication was prescribed in response to her decreased socialization and subsequent mood changes. This is all resolved now that she isn't likes permanently.

## 2012-02-17 NOTE — Progress Notes (Signed)
Patient ID: Sharon [name], female   DOB: 1916/04/16, 78 y.o.   MRN: [id number]  Patient Active Problem List  Diagnosis  . Coronary artery disease, occlusive  . Rotator cuff arthropathy  . Hypertension  . Restless leg syndrome  . Edema of lower extremity  . Chronic kidney disease (CKD), stage III (moderate)  . Diabetes mellitus with end-stage renal disease  . Chronic kidney disease (CKD), stage IV (severe)  . Adjustment disorder with physical complaints  . Viral URI with cough  . Strain of knee and leg, right    Subjective:  CC:   Chief Complaint  Patient presents with  . Follow-up    Hospital    HPI:   Sharon [name] a 78 y.o. female who presents after a breif hospitalization.   According to review of records available on [facility] , patient was admitted on July 9 with severe bradycardia tto 30 bpm, symptomati,c with dizziness, hypotension and hyperkalemia. She was taken to the ER and pacer pads were applied.  She was admitted to Telemetry and her beta blockers and diuretics were held. She did not require cardioversion or pacemaker placement. She was also noted to be in acute renal failure, acute on chronic. She was kept for several days and discharged home without her ARB and beta blocker. Since then she has been moved in by her family to [place]  for 24-hour care. She is accompanied by her son [name] today. Her chief complaint is recurrent episodes of chest pain which occur around 3 AM when she lies on her left side. She is woken up by the need to urinate and notices when she goes back to bed she has esophageal burning. Sensation is relieved with a dose of TUMS followed by change in position to lying on her right side. She does not take any H2 blocker at bedtime and does take omeprazole in the morning. She has also resumed physical therapy and at yesterday's session was noted to have developed angina during an episode of pedaling. She also was noted to have an oxygen desaturation  of 86% however I have not received this in writing. Today in the office she is pain-free. I did ambulate her with her walker and her sats were 95-96%. Her resting heart rate was 108 and jumped to 145 with ambulation. She did report a strange feeling in her chest at the end of the 2 minute walk. Regarding her living situation her son [name] has determined that she fairs better when she visits her legs as opposed to living in his private home. She likes to socialization at home that helps her thrive. She is happy with this decision. He does not think he needs to be on mirtazapine since when she is active and likes she is happy.    Past Medical History  Diagnosis Date  . Diabetes mellitus, type 2   . Diabetic nephropathy   . GERD (gastroesophageal reflux disease)   . Essential hypertension, benign   . Coronary atherosclerosis of native coronary artery   . Venous stasis   . Osteoarthritis     with chronic back pain  . Osteopenia   . Urge incontinence     mild  . History of CVA (cerebrovascular accident) 64  . Vitamin d deficiency   . Hyperlipidemia   . Vertigo   . Restless leg syndrome   . Anemia     Past Surgical History  Procedure Date  . Bladder repair 1988  . Total abdominal  hysterectomy 1980  . Neck surgery 1991  . Lumbar laminectomy   . Knee arthroscopy   . Spine surgery     lumbar laminectomy         The following portions of the patient's history were reviewed and updated as appropriate: Allergies, current medications, and problem list.    Review of Systems:  Review of Systems  Constitutional: Negative for fever and malaise/fatigue.  HENT: Negative for ear pain and congestion.   Eyes: Negative for blurred vision and double vision.  Respiratory: Negative for cough and shortness of breath.   Cardiovascular: Positive for chest pain and leg swelling.  Gastrointestinal: Positive for heartburn. Negative for vomiting, abdominal pain and blood in stool.    Genitourinary: Negative for dysuria and frequency.  Musculoskeletal: Positive for joint pain. Negative for falls.  Skin: Negative for rash.  Neurological: Negative for dizziness, focal weakness and headaches.  Endo/Heme/Allergies: Does not bruise/bleed easily.  Psychiatric/Behavioral: Negative for depression. The patient is not nervous/anxious and does not have insomnia.      History   Social History  . Marital Status: Widowed    Spouse Name: N/A    Number of Children: N/A  . Years of Education: N/A   Occupational History  . Not on file.   Social History Main Topics  . Smoking status: Never Smoker   . Smokeless tobacco: Never Used  . Alcohol Use: No  . Drug Use: No  . Sexually Active: Not on file   Other Topics Concern  . Not on file   Social History Narrative  . No narrative on file    Objective:  BP 138/64  Pulse 106  Temp 98.4 F (36.9 C) (Oral)  Resp 18  Wt 187 lb (84.823 kg)  SpO2 95%  General appearance: alert, cooperative and appears stated age Ears: normal TM's and external ear canals both ears Throat: lips, mucosa, and tongue normal; teeth and gums normal Neck: no adenopathy, no carotid bruit, supple, symmetrical, trachea midline and thyroid not enlarged, symmetric, no tenderness/mass/nodules Back: symmetric, no curvature. ROM normal. No CVA tenderness. Lungs: clear to auscultation bilaterally Heart: regular rate and rhythm, S1, S2 normal, no murmur, click, rub or gallop Abdomen: soft, non-tender; bowel sounds normal; no masses,  no organomegaly Pulses: 2+ and symmetric Skin: Skin color, texture, turgor normal. No rashes or lesions Lymph nodes: Cervical, supraclavicular, and axillary nodes normal.  Assessment and Plan:  Coronary artery disease, occlusive With recent development of exertional angina. I have given her prescription for nitroglycerin tablets. She is already taking indoor.  Hypertension Previously controlled with the Derby and  Toprol both of which were stopped during recent hospitalization. Because of resulting tachycardia I am resuming the Toprol in the form of metoprolol tartrate. Started 12.5 mg twice daily.  Chronic kidney disease (CKD), stage IV (severe) With recent episode of acute renal failure and mild hyperkalemia to 5.5. Repeat basic metabolic panel to be done today potassium restriction has been recommended.  Adjustment disorder with physical complaints We are stopping her mirtazapine today as the medication was prescribed in response to her decreased socialization and subsequent mood changes. This is all resolved now that she isn't likes permanently.   Updated Medication List Outpatient Encounter Prescriptions as of 02/17/2012  Medication Sig Dispense Refill  . aspirin 81 MG tablet Take 81 mg by mouth daily.      Marland Kitchen atorvastatin (LIPITOR) 20 MG tablet Take 20 mg by mouth daily.      . ergocalciferol (VITAMIN D2)  50000 UNITS capsule Take 50,000 Units by mouth once a week.      . furosemide (LASIX) 40 MG tablet Take 40 mg by mouth daily.      . insulin glargine (LANTUS SOLOSTAR) 100 UNIT/ML injection Inject 25 units daily SQ  5 pen  PRN  . isosorbide mononitrate (IMDUR) 60 MG 24 hr tablet Take one and one half by mouth daily      . omeprazole (PRILOSEC) 20 MG capsule Take 20 mg by mouth daily.      . pramipexole (MIRAPEX) 0.25 MG tablet Take 1 tablet (0.25 mg total) by mouth 4 (four) times daily.  120 tablet  3  . ranolazine (RANEXA) 500 MG 12 hr tablet Take 500 mg by mouth 2 (two) times daily.      Marland Kitchen DISCONTD: furosemide (LASIX) 40 MG tablet Take 2 tablets (80 mg total) by mouth daily.  30 tablet  1  . DISCONTD: mirtazapine (REMERON) 7.5 MG tablet TAKE 1 TABLET BY MOUTH AT BEDTIME  30 tablet  1  . famotidine (PEPCID) 20 MG tablet Take 1 tablet (20 mg total) by mouth at bedtime.  30 tablet  4  . metolazone (ZAROXOLYN) 10 MG tablet Take 1 tablet (10 mg total) by mouth daily. Take 30 minutes prior to  furosemide dose  30 tablet  1  . metoprolol tartrate (LOPRESSOR) 25 MG tablet Take 0.5 tablets (12.5 mg total) by mouth 2 (two) times daily.  60 tablet  3  . nitroGLYCERIN (NITROSTAT) 0.4 MG SL tablet Place 1 tablet (0.4 mg total) under the tongue every 5 (five) minutes as needed for chest pain.  50 tablet  3  . DISCONTD: Azilsartan Medoxomil (EDARBI) 40 MG TABS Take 1 capsule by mouth daily.  30 tablet  3  . DISCONTD: metoprolol tartrate (LOPRESSOR) 25 MG tablet Take 25 mg by mouth 3 (three) times daily.         Orders Placed This Encounter  Procedures  . Basic metabolic panel  . CK Total (and CKMB)  . Troponin I    No Follow-up on file.

## 2012-02-17 NOTE — Assessment & Plan Note (Signed)
With recent development of exertional angina. I have given her prescription for nitroglycerin tablets. She is already taking indoor.

## 2012-02-17 NOTE — Assessment & Plan Note (Signed)
Previously controlled with the Derby and Toprol both of which were stopped during recent hospitalization. Because of resulting tachycardia I am resuming the Toprol in the form of metoprolol tartrate. Started 12.5 mg twice daily.

## 2012-02-18 LAB — TROPONIN I: Troponin I: 0.07 ng/mL — ABNORMAL HIGH (ref <0.06)

## 2012-02-18 LAB — CK TOTAL AND CKMB (NOT AT ARMC)

## 2012-02-22 ENCOUNTER — Encounter: Payer: Self-pay | Admitting: Cardiovascular Disease

## 2012-02-22 ENCOUNTER — Ambulatory Visit (INDEPENDENT_AMBULATORY_CARE_PROVIDER_SITE_OTHER): Payer: MEDICARE | Admitting: Cardiovascular Disease

## 2012-02-22 VITALS — BP 160/60 | HR 81 | Ht 60.0 in | Wt 189.0 lb

## 2012-02-22 DIAGNOSIS — R600 Localized edema: Secondary | ICD-10-CM

## 2012-02-22 DIAGNOSIS — I219 Acute myocardial infarction, unspecified: Secondary | ICD-10-CM

## 2012-02-22 DIAGNOSIS — R001 Bradycardia, unspecified: Secondary | ICD-10-CM

## 2012-02-22 DIAGNOSIS — N184 Chronic kidney disease, stage 4 (severe): Secondary | ICD-10-CM

## 2012-02-22 DIAGNOSIS — I499 Cardiac arrhythmia, unspecified: Secondary | ICD-10-CM | POA: Insufficient documentation

## 2012-02-22 DIAGNOSIS — R609 Edema, unspecified: Secondary | ICD-10-CM

## 2012-02-22 DIAGNOSIS — I2510 Atherosclerotic heart disease of native coronary artery without angina pectoris: Secondary | ICD-10-CM

## 2012-02-22 DIAGNOSIS — I498 Other specified cardiac arrhythmias: Secondary | ICD-10-CM

## 2012-02-22 DIAGNOSIS — I10 Essential (primary) hypertension: Secondary | ICD-10-CM

## 2012-02-22 NOTE — Assessment & Plan Note (Signed)
Creatinine back to baseline on Lasix 40 mg daily. We have suggested she hold her Lasix for severe dry mouth, dry skin, low blood pressure concerning for dehydration.

## 2012-02-22 NOTE — Patient Instructions (Addendum)
You are doing well. No medication changes were made.  Please place TED hose as needed for worsening lower extremity swelling  Please call us if you have new issues that need to be addressed before your next appt.  Your physician wants you to follow-up in: 3 months.  You will receive a reminder letter in the mail two months in advance. If you don't receive a letter, please call our office to schedule the follow-up appointment.

## 2012-02-22 NOTE — Assessment & Plan Note (Signed)
Rare episodes of chest pain. She has nitroglycerin to take as needed for worsening symptoms.

## 2012-02-22 NOTE — Progress Notes (Signed)
Patient ID: Sharon [name], female    DOB: 10/14/1915, 78 y.o.   MRN: [id number]  HPI Comments: Sharon [name] is a very pleasant 67 your woman with history of coronary artery disease, turned down for bypass given renal dysfunction who presented to [facility] July 9 with dizziness, lightheaded, bradycardia. She was found to be in junctional rhythm with heart rates in the 20s to 30s, low blood pressure with systolic in the 80s, potassium 5.5, worsening renal dysfunction consistent with dehydration. Initially presented on metoprolol XL 25 mg daily. Notes indicate a history of congestive heart failure, cardiac catheterization in [place] several years earlier found to have three-vessel disease.  She was discharged several days later and beta blocker was held. In followup, heart rate was elevated and she was restarted on low-dose beta blocker, metoprolol tartrate 12.5 mg twice a day. She has rare episodes of chest pain with stress described as a burning. She was given nitroglycerin but she has not tried this yet. She reports she is doing well with physical therapy. She is able to walk relatively quickly with a walker. No falls. She is currently living in [facility] assisted living.   She is taking Lasix 40 mg daily. Metolazone was held. Also previously on Lasix twice a day. EKG shows normal sinus rhythm with rate 81 beats per minute, left bundle branch block  Echocardiogram done in the hospital showed normal LV function, mildly dilated left atrium, mild TR, right ventricular systolic pressure 30-40 mmHg     Outpatient Encounter Prescriptions as of 02/22/2012  Medication Sig Dispense Refill  . aspirin 81 MG tablet Take 81 mg by mouth daily.      Marland Kitchen atorvastatin (LIPITOR) 20 MG tablet Take 20 mg by mouth daily.      . ergocalciferol (VITAMIN D2) 50000 UNITS capsule Take 50,000 Units by mouth every 30 (thirty) days.       . famotidine (PEPCID) 20 MG tablet Take 1 tablet (20 mg total) by mouth at bedtime.   30 tablet  4  . furosemide (LASIX) 40 MG tablet Take 40 mg by mouth daily.      . insulin glargine (LANTUS SOLOSTAR) 100 UNIT/ML injection Inject 25 units daily SQ  5 pen  PRN  . isosorbide mononitrate (IMDUR) 60 MG 24 hr tablet Take one and one half by mouth daily      . metoprolol tartrate (LOPRESSOR) 25 MG tablet Take 0.5 tablets (12.5 mg total) by mouth 2 (two) times daily.  60 tablet  3  . nitroGLYCERIN (NITROSTAT) 0.4 MG SL tablet Place 1 tablet (0.4 mg total) under the tongue every 5 (five) minutes as needed for chest pain.  50 tablet  3  . omeprazole (PRILOSEC) 20 MG capsule Take 20 mg by mouth daily.      . pramipexole (MIRAPEX) 0.25 MG tablet Take 0.25 mg by mouth 3 (three) times daily.      . ranolazine (RANEXA) 500 MG 12 hr tablet Take 500 mg by mouth 2 (two) times daily.        Review of Systems  Constitutional: Negative.   HENT: Negative.   Eyes: Negative.   Respiratory: Negative.   Cardiovascular: Negative.   Gastrointestinal: Negative.   Musculoskeletal: Positive for gait problem.  Skin: Negative.   Neurological: Negative.   Hematological: Negative.   Psychiatric/Behavioral: Negative.   All other systems reviewed and are negative.    BP 160/60  Pulse 81  Ht 5' (1.524 m)  Wt 189 lb (85.73  kg)  BMI 36.91 kg/m2  Physical Exam  Nursing note and vitals reviewed. Constitutional: She is oriented to person, place, and time. She appears well-developed and well-nourished.  HENT:  Head: Normocephalic.  Nose: Nose normal.  Mouth/Throat: Oropharynx is clear and moist.  Eyes: Conjunctivae are normal. Pupils are equal, round, and reactive to light.  Neck: Normal range of motion. Neck supple. No JVD present.  Cardiovascular: Normal rate, regular rhythm, S1 normal, S2 normal, normal heart sounds and intact distal pulses.  Exam reveals no gallop and no friction rub.   No murmur heard. Pulmonary/Chest: Effort normal and breath sounds normal. No respiratory distress. She has  no wheezes. She has no rales. She exhibits no tenderness.  Abdominal: Soft. Bowel sounds are normal. She exhibits no distension. There is no tenderness.  Musculoskeletal: Normal range of motion. She exhibits no edema and no tenderness.  Lymphadenopathy:    She has no cervical adenopathy.  Neurological: She is alert and oriented to person, place, and time. Coordination normal.  Skin: Skin is warm and dry. No rash noted. No erythema.  Psychiatric: She has a normal mood and affect. Her behavior is normal. Judgment and thought content normal.         Assessment and Plan

## 2012-02-22 NOTE — Assessment & Plan Note (Signed)
Recent junctional rhythm with dizziness in the setting of hyperkalemia, dehydration. Rate and rhythm improved in the hospital. Back on low-dose beta blocker, appears stable. We'll need to use diuretic cautiously.

## 2012-02-22 NOTE — Assessment & Plan Note (Signed)
No significant edema on today's visit . We have suggested she use compression hose as needed for worsening edema.

## 2012-02-22 NOTE — Assessment & Plan Note (Signed)
Blood pressure is well controlled on today's visit. No changes made to the medications. 

## 2012-02-23 ENCOUNTER — Ambulatory Visit: Payer: MEDICARE | Admitting: Internal Medicine

## 2012-03-21 ENCOUNTER — Telehealth: Payer: Self-pay | Admitting: Internal Medicine

## 2012-03-21 MED ORDER — PRAMIPEXOLE DIHYDROCHLORIDE 0.25 MG PO TABS: 0.2500 mg | ORAL_TABLET | Freq: Three times a day (TID) | ORAL | Status: DC

## 2012-03-21 MED ORDER — FUROSEMIDE 40 MG PO TABS: 40.0000 mg | ORAL_TABLET | Freq: Every day | ORAL | Status: DC

## 2012-03-21 NOTE — Telephone Encounter (Signed)
Patient needing refills on Furosemide 40 mg and Pramipexole 0.25 mg.

## 2012-03-23 ENCOUNTER — Encounter: Payer: Self-pay | Admitting: Internal Medicine

## 2012-03-23 ENCOUNTER — Ambulatory Visit (INDEPENDENT_AMBULATORY_CARE_PROVIDER_SITE_OTHER): Payer: MEDICARE | Admitting: Internal Medicine

## 2012-03-23 VITALS — BP 140/60 | HR 80 | Temp 98.2°F | Resp 18 | Wt 190.2 lb

## 2012-03-23 DIAGNOSIS — S86911A Strain of unspecified muscle(s) and tendon(s) at lower leg level, right leg, initial encounter: Secondary | ICD-10-CM

## 2012-03-23 DIAGNOSIS — N186 End stage renal disease: Secondary | ICD-10-CM

## 2012-03-23 DIAGNOSIS — R600 Localized edema: Secondary | ICD-10-CM

## 2012-03-23 DIAGNOSIS — N058 Unspecified nephritic syndrome with other morphologic changes: Secondary | ICD-10-CM

## 2012-03-23 DIAGNOSIS — E1129 Type 2 diabetes mellitus with other diabetic kidney complication: Secondary | ICD-10-CM

## 2012-03-23 DIAGNOSIS — N184 Chronic kidney disease, stage 4 (severe): Secondary | ICD-10-CM

## 2012-03-23 DIAGNOSIS — IMO0002 Reserved for concepts with insufficient information to code with codable children: Secondary | ICD-10-CM

## 2012-03-23 DIAGNOSIS — M25569 Pain in unspecified knee: Secondary | ICD-10-CM

## 2012-03-23 DIAGNOSIS — R609 Edema, unspecified: Secondary | ICD-10-CM

## 2012-03-23 DIAGNOSIS — E1122 Type 2 diabetes mellitus with diabetic chronic kidney disease: Secondary | ICD-10-CM

## 2012-03-23 MED ORDER — ONDANSETRON HCL 4 MG PO TABS: 4.0000 mg | ORAL_TABLET | Freq: Three times a day (TID) | ORAL | Status: AC | PRN

## 2012-03-23 MED ORDER — TRAMADOL HCL 50 MG PO TABS: 50.0000 mg | ORAL_TABLET | Freq: Three times a day (TID) | ORAL | Status: AC | PRN

## 2012-03-23 NOTE — Progress Notes (Signed)
Patient ID: Sharon [name], female   DOB: 22-Mar-1916, 78 y.o.   MRN: [id number]  Patient Active Problem List  Diagnosis  . Coronary artery disease, occlusive  . Rotator cuff arthropathy  . Hypertension  . Restless leg syndrome  . Edema of lower extremity  . Chronic kidney disease (CKD), stage III (moderate)  . Diabetes mellitus with end-stage renal disease  . Chronic kidney disease (CKD), stage IV (severe)  . Adjustment disorder with physical complaints  . Strain of knee and leg, right  . Arrhythmia    Subjective:  CC:   Chief Complaint  Patient presents with  . Follow-up    HPI:   Sharon [name] a 78 y.o. female who presents with worsening medial knee pain on the right knee.  Her knee pain was improving with regular PT she was receiving at [facility] but has worsened after stopping rehab about 2 weeks ago. The pai is constant and making it difficult  to walk bc it is aggravated by weight bearing.  Kinesiology tape not helping . Wants to see Dr. Hyacinth Meeker again. No prior MRI.  Sleeping 4 to 8 hours per day (more per son).  Has not tolerated narcotics in the past due to nausea.  Cannot take NSAIDs due to CKD and CHF.  No prior trial of tramadol.     Past Medical History  Diagnosis Date  . Diabetes mellitus, type 2   . Diabetic nephropathy   . GERD (gastroesophageal reflux disease)   . Essential hypertension, benign   . Coronary atherosclerosis of native coronary artery   . Venous stasis   . Osteoarthritis     with chronic back pain  . Osteopenia   . Urge incontinence     mild  . History of CVA (cerebrovascular accident) 44  . Vitamin d deficiency   . Hyperlipidemia   . Vertigo   . Restless leg syndrome   . Anemia     Past Surgical History  Procedure Date  . Bladder repair 1988  . Total abdominal hysterectomy 1980  . Neck surgery 1991  . Lumbar laminectomy   . Knee arthroscopy   . Spine surgery     lumbar laminectomy         The following portions  of the patient's history were reviewed and updated as appropriate: Allergies, current medications, and problem list.    Review of Systems:   A comprehensive ROS was done and positive for knee pain.   The rest was negative.     History   Social History  . Marital Status: Widowed    Spouse Name: N/A    Number of Children: N/A  . Years of Education: N/A   Occupational History  . Not on file.   Social History Main Topics  . Smoking status: Never Smoker   . Smokeless tobacco: Never Used  . Alcohol Use: No  . Drug Use: No  . Sexually Active: Not on file   Other Topics Concern  . Not on file   Social History Narrative  . No narrative on file    Objective:  BP 140/60  Pulse 80  Temp 98.2 F (36.8 C) (Oral)  Resp 18  Wt 190 lb 4 oz (86.297 kg)  SpO2 94%  General appearance: alert, cooperative and appears stated age Neck: no adenopathy, no carotid bruit, supple, symmetrical, trachea midline and thyroid not enlarged, symmetric, no tenderness/mass/nodules Back: symmetric, no curvature. ROM normal. No CVA tenderness. Lungs: clear to auscultation bilaterally  Heart: regular rate and rhythm, S1, S2 normal, no murmur, click, rub or gallop Abdomen: soft, non-tender; bowel sounds normal; no masses,  no organomegaly Pulses: 2+ and symmetric Skin: Skin color, texture, turgor normal. No rashes or lesions Lymph nodes: Cervical, supraclavicular, and axillary nodes normal.  Assessment and Plan:  Strain of knee and leg, right Her pain has worsened despite several months of PT .  She had  negative x rays per Dr. Hyacinth Meeker. She cannot ambulate now without assistance.  MRI ordered to rule out ACL or medial meniscal tear. She is a poor candidate for TKR due to CAD , cardiomyopathy  And CKD, but may tolerate arthroscopy if indicated.   Diabetes mellitus with end-stage renal disease Aggravated by wt gain due to immobility. Diet reviewed.   Edema of lower extremity Currently better  controlled with stockings and diuretics.   Chronic kidney disease (CKD), stage IV (severe) Stable by repeat labs in late July,  Stage IV.   Updated Medication List Outpatient Encounter Prescriptions as of 03/23/2012  Medication Sig Dispense Refill  . aspirin 81 MG tablet Take 81 mg by mouth daily.      Marland Kitchen atorvastatin (LIPITOR) 20 MG tablet Take 20 mg by mouth daily.      . ergocalciferol (VITAMIN D2) 50000 UNITS capsule Take 50,000 Units by mouth every 30 (thirty) days.       . famotidine (PEPCID) 20 MG tablet Take 1 tablet (20 mg total) by mouth at bedtime.  30 tablet  4  . furosemide (LASIX) 40 MG tablet Take 1 tablet (40 mg total) by mouth daily.  30 tablet  3  . insulin glargine (LANTUS) 100 UNIT/ML injection Inject 24 Units into the skin at bedtime.      . isosorbide mononitrate (IMDUR) 60 MG 24 hr tablet Take one and one half by mouth daily      . metoprolol tartrate (LOPRESSOR) 25 MG tablet Take 0.5 tablets (12.5 mg total) by mouth 2 (two) times daily.  60 tablet  3  . nitroGLYCERIN (NITROSTAT) 0.4 MG SL tablet Place 1 tablet (0.4 mg total) under the tongue every 5 (five) minutes as needed for chest pain.  50 tablet  3  . omeprazole (PRILOSEC) 20 MG capsule Take 20 mg by mouth daily.      . pramipexole (MIRAPEX) 0.25 MG tablet Take 0.5 mg by mouth at bedtime as needed.      . ranolazine (RANEXA) 500 MG 12 hr tablet Take 500 mg by mouth 2 (two) times daily.      Marland Kitchen DISCONTD: insulin glargine (LANTUS SOLOSTAR) 100 UNIT/ML injection Inject 25 units daily SQ  5 pen  PRN  . DISCONTD: pramipexole (MIRAPEX) 0.25 MG tablet Take 1 tablet (0.25 mg total) by mouth 3 (three) times daily.  90 tablet  3  . ondansetron (ZOFRAN) 4 MG tablet Take 1 tablet (4 mg total) by mouth every 8 (eight) hours as needed for nausea.  60 tablet  0  . traMADol (ULTRAM) 50 MG tablet Take 1 tablet (50 mg total) by mouth every 8 (eight) hours as needed for pain.  90 tablet  0     Orders Placed This Encounter    Procedures  . MR Knee Right Wo Contrast  . AMB referral to orthopedics    No Follow-up on file.

## 2012-03-23 NOTE — Assessment & Plan Note (Signed)
Aggravated by wt gain due to immobility. Diet reviewed.

## 2012-03-23 NOTE — Patient Instructions (Addendum)
We are going to try a medication for pain called tramadol.  Please take the zofran 30 minutes prior to prevent nausea  MRI of right knee ordered,  And follow up appt with Dr. Hyacinth Meeker afterward

## 2012-03-23 NOTE — Assessment & Plan Note (Addendum)
Her pain has worsened despite several months of PT .  She had  negative x rays per Dr. Hyacinth Meeker. She cannot ambulate now without assistance.  MRI ordered to rule out ACL or medial meniscal tear. She is a poor candidate for TKR due to CAD , cardiomyopathy  And CKD, but may tolerate arthroscopy if indicated.

## 2012-03-24 ENCOUNTER — Encounter: Payer: Self-pay | Admitting: Internal Medicine

## 2012-03-25 NOTE — Assessment & Plan Note (Signed)
Currently better controlled with stockings and diuretics.

## 2012-03-25 NOTE — Assessment & Plan Note (Signed)
Stable by repeat labs in late July,  Stage IV.

## 2012-03-29 ENCOUNTER — Ambulatory Visit: Payer: Self-pay | Admitting: Internal Medicine

## 2012-03-30 ENCOUNTER — Telehealth: Payer: Self-pay | Admitting: Internal Medicine

## 2012-03-30 NOTE — Telephone Encounter (Signed)
MRI right knee shows complex tear of medial meniscus and horizontal tear of lateral meniscus.  I would like to set up appt with orthopedics. Is there someone that she would prefer to see?

## 2012-03-31 NOTE — Telephone Encounter (Signed)
Patient's son notified.

## 2012-04-01 ENCOUNTER — Telehealth: Payer: Self-pay | Admitting: Internal Medicine

## 2012-04-01 MED ORDER — OMEPRAZOLE 20 MG PO CPDR: 20.0000 mg | DELAYED_RELEASE_CAPSULE | Freq: Every day | ORAL | Status: DC

## 2012-04-01 NOTE — Telephone Encounter (Signed)
Spoke with Larita Fife with [facility] and patient is on Prilosec 20mg  and they are requesting a Rx not a dosage increase.  Rx sent to pharmacy electronically.

## 2012-04-01 NOTE — Telephone Encounter (Signed)
Caller: Lynn/Patient; Patient Name: [name], Sharon; PCP: Duncan Dull (Adults only); Best Callback Phone Number: [phone]; Reason for call: Other Larita Fife with [facility] is calling because pt usually takes Prilosec OTC which cost her 40$ a month  and she wants to get a rx medication so she can get it cheaper.

## 2012-04-01 NOTE — Telephone Encounter (Signed)
Fine to call this in a prescription Prilosec 40mg  daily x 30days, refill 3

## 2012-04-06 ENCOUNTER — Encounter: Payer: Self-pay | Admitting: Internal Medicine

## 2012-04-25 ENCOUNTER — Other Ambulatory Visit: Payer: Self-pay | Admitting: Internal Medicine

## 2012-05-27 ENCOUNTER — Ambulatory Visit: Payer: MEDICARE | Admitting: Cardiovascular Disease

## 2012-05-31 ENCOUNTER — Ambulatory Visit (INDEPENDENT_AMBULATORY_CARE_PROVIDER_SITE_OTHER): Payer: MEDICARE | Admitting: Cardiovascular Disease

## 2012-05-31 ENCOUNTER — Encounter: Payer: Self-pay | Admitting: Cardiovascular Disease

## 2012-05-31 ENCOUNTER — Other Ambulatory Visit: Payer: Self-pay | Admitting: Internal Medicine

## 2012-05-31 VITALS — BP 124/62 | HR 71 | Ht 60.0 in | Wt 187.5 lb

## 2012-05-31 DIAGNOSIS — R609 Edema, unspecified: Secondary | ICD-10-CM

## 2012-05-31 DIAGNOSIS — I499 Cardiac arrhythmia, unspecified: Secondary | ICD-10-CM

## 2012-05-31 DIAGNOSIS — I219 Acute myocardial infarction, unspecified: Secondary | ICD-10-CM

## 2012-05-31 DIAGNOSIS — I10 Essential (primary) hypertension: Secondary | ICD-10-CM

## 2012-05-31 DIAGNOSIS — R0602 Shortness of breath: Secondary | ICD-10-CM

## 2012-05-31 DIAGNOSIS — I2510 Atherosclerotic heart disease of native coronary artery without angina pectoris: Secondary | ICD-10-CM

## 2012-05-31 DIAGNOSIS — R600 Localized edema: Secondary | ICD-10-CM

## 2012-05-31 DIAGNOSIS — E1129 Type 2 diabetes mellitus with other diabetic kidney complication: Secondary | ICD-10-CM

## 2012-05-31 DIAGNOSIS — E1122 Type 2 diabetes mellitus with diabetic chronic kidney disease: Secondary | ICD-10-CM

## 2012-05-31 DIAGNOSIS — N186 End stage renal disease: Secondary | ICD-10-CM

## 2012-05-31 NOTE — Assessment & Plan Note (Signed)
She seen by Dr. Ronn Melena. Creatinine in the past several months was greater than 2 which is her baseline.

## 2012-05-31 NOTE — Patient Instructions (Addendum)
You are doing well. No medication changes were made.  Please call us if you have new issues that need to be addressed before your next appt.  Your physician wants you to follow-up in: 6 months.  You will receive a reminder letter in the mail two months in advance. If you don't receive a letter, please call our office to schedule the follow-up appointment.   

## 2012-05-31 NOTE — Assessment & Plan Note (Signed)
We have suggested she continue to wear her compression hose and take Lasix 40 mg daily.

## 2012-05-31 NOTE — Assessment & Plan Note (Signed)
Blood pressure is well controlled on today's visit. No changes made to the medications. 

## 2012-05-31 NOTE — Assessment & Plan Note (Signed)
Heart rate well controlled. No dizziness or concern for arrhythmia.

## 2012-05-31 NOTE — Progress Notes (Signed)
Patient ID: Sharon [name], female    DOB: August 24, 1915, 78 y.o.   MRN: [id number]  HPI Comments: Sharon [name] is a very pleasant 25 your woman with history of coronary artery disease, turned down for bypass given renal dysfunction who presented to [facility] February 02 2012 with dizziness, lightheaded, bradycardia.  found to be in junctional rhythm with heart rates in the 20s to 30s, low blood pressure with systolics in the 80s, potassium 5.5, worsening renal dysfunction consistent with dehydration. Initially presenting on metoprolol XL 25 mg daily. Notes indicated a history of congestive heart failure, cardiac catheterization in [place] several years earlier found to have three-vessel disease.   beta blocker was held with improvement of her arrhythmia and heart rate. For tachycardia in followup, she was started on low-dose metoprolol tartrate twice a day. She is able to walk relatively quickly with a walker. No falls. She is currently living in assisted living.  She is taking Lasix 40 mg daily. Metolazone was held.  previously on Lasix twice a day.  EKG shows normal sinus rhythm with rate 72 beats per minute, right bundle branch block with left anterior fascicular block  Echocardiogram done in the hospital showed normal LV function, mildly dilated left atrium, mild TR, right ventricular systolic pressure 30-40 mmHg     Outpatient Encounter Prescriptions as of 05/31/2012  Medication Sig Dispense Refill  . aspirin 81 MG tablet Take 81 mg by mouth daily.      Marland Kitchen atorvastatin (LIPITOR) 20 MG tablet Take 20 mg by mouth daily.      . ergocalciferol (VITAMIN D2) 50000 UNITS capsule Take 50,000 Units by mouth every 30 (thirty) days.       . famotidine (PEPCID) 20 MG tablet Take 1 tablet (20 mg total) by mouth at bedtime.  30 tablet  4  . furosemide (LASIX) 40 MG tablet Take 1 tablet (40 mg total) by mouth daily.  30 tablet  3  . insulin glargine (LANTUS) 100 UNIT/ML injection Inject 24 Units into the skin at  bedtime.      . isosorbide mononitrate (IMDUR) 60 MG 24 hr tablet Take one and one half tablet daily.      . metoprolol tartrate (LOPRESSOR) 25 MG tablet Take 0.5 tablets (12.5 mg total) by mouth 2 (two) times daily.  60 tablet  3  . nitroGLYCERIN (NITROSTAT) 0.4 MG SL tablet Place 1 tablet (0.4 mg total) under the tongue every 5 (five) minutes as needed for chest pain.  50 tablet  3  . omeprazole (PRILOSEC) 20 MG capsule Take 1 capsule (20 mg total) by mouth daily.  90 capsule  3  . ondansetron (ZOFRAN) 4 MG tablet Take 4 mg by mouth every 8 (eight) hours as needed.      . pramipexole (MIRAPEX) 0.25 MG tablet Take 0.5 mg by mouth at bedtime as needed.      . ranolazine (RANEXA) 500 MG 12 hr tablet Take 500 mg by mouth 2 (two) times daily.      . traMADol (ULTRAM) 50 MG tablet Take 50 mg by mouth every 8 (eight) hours as needed.         Review of Systems  Constitutional: Negative.   HENT: Negative.   Eyes: Negative.   Respiratory: Negative.   Cardiovascular: Negative.   Gastrointestinal: Negative.   Skin: Negative.   Neurological: Negative.   Hematological: Negative.   Psychiatric/Behavioral: Negative.   All other systems reviewed and are negative.    BP 124/62  Pulse 71  Ht 5' (1.524 m)  Wt 187 lb 8 oz (85.049 kg)  BMI 36.62 kg/m2  Physical Exam  Nursing note and vitals reviewed. Constitutional: She is oriented to person, place, and time. She appears well-developed and well-nourished.  HENT:  Head: Normocephalic.  Nose: Nose normal.  Mouth/Throat: Oropharynx is clear and moist.  Eyes: Conjunctivae normal are normal. Pupils are equal, round, and reactive to light.  Neck: Normal range of motion. Neck supple. No JVD present.  Cardiovascular: Normal rate, regular rhythm, S1 normal, S2 normal, normal heart sounds and intact distal pulses.  Exam reveals no gallop and no friction rub.   No murmur heard. Pulmonary/Chest: Effort normal and breath sounds normal. No respiratory  distress. She has no wheezes. She has no rales. She exhibits no tenderness.  Abdominal: Soft. Bowel sounds are normal. She exhibits no distension. There is no tenderness.  Musculoskeletal: Normal range of motion. She exhibits no edema and no tenderness.  Lymphadenopathy:    She has no cervical adenopathy.  Neurological: She is alert and oriented to person, place, and time. Coordination normal.  Skin: Skin is warm and dry. No rash noted. No erythema.  Psychiatric: She has a normal mood and affect. Her behavior is normal. Judgment and thought content normal.         Assessment and Plan

## 2012-05-31 NOTE — Assessment & Plan Note (Signed)
Currently with no symptoms of angina. No further workup at this time. Continue current medication regimen. 

## 2012-06-06 ENCOUNTER — Encounter: Payer: Self-pay | Admitting: Internal Medicine

## 2012-06-09 ENCOUNTER — Encounter: Payer: Self-pay | Admitting: Cardiovascular Disease

## 2012-06-10 ENCOUNTER — Other Ambulatory Visit: Payer: Self-pay | Admitting: Internal Medicine

## 2012-06-10 NOTE — Telephone Encounter (Signed)
Refill request for Metoprolol Tartrate 25 mg #60 2 R sent electronic to [facility]

## 2012-06-17 ENCOUNTER — Other Ambulatory Visit: Payer: Self-pay | Admitting: Internal Medicine

## 2012-06-17 NOTE — Telephone Encounter (Signed)
Refilled IMDUR 60 mg

## 2012-06-21 ENCOUNTER — Ambulatory Visit (INDEPENDENT_AMBULATORY_CARE_PROVIDER_SITE_OTHER): Payer: MEDICARE | Admitting: Internal Medicine

## 2012-06-21 ENCOUNTER — Encounter: Payer: Self-pay | Admitting: Internal Medicine

## 2012-06-21 VITALS — BP 160/60 | HR 92 | Temp 98.3°F | Resp 12 | Ht 60.0 in | Wt 185.5 lb

## 2012-06-21 DIAGNOSIS — M1711 Unilateral primary osteoarthritis, right knee: Secondary | ICD-10-CM

## 2012-06-21 DIAGNOSIS — R600 Localized edema: Secondary | ICD-10-CM

## 2012-06-21 DIAGNOSIS — N186 End stage renal disease: Secondary | ICD-10-CM

## 2012-06-21 DIAGNOSIS — I2510 Atherosclerotic heart disease of native coronary artery without angina pectoris: Secondary | ICD-10-CM

## 2012-06-21 DIAGNOSIS — E1129 Type 2 diabetes mellitus with other diabetic kidney complication: Secondary | ICD-10-CM

## 2012-06-21 DIAGNOSIS — N184 Chronic kidney disease, stage 4 (severe): Secondary | ICD-10-CM

## 2012-06-21 DIAGNOSIS — M1710 Unilateral primary osteoarthritis, unspecified knee: Secondary | ICD-10-CM

## 2012-06-21 DIAGNOSIS — R079 Chest pain, unspecified: Secondary | ICD-10-CM

## 2012-06-21 DIAGNOSIS — E119 Type 2 diabetes mellitus without complications: Secondary | ICD-10-CM

## 2012-06-21 DIAGNOSIS — I219 Acute myocardial infarction, unspecified: Secondary | ICD-10-CM

## 2012-06-21 DIAGNOSIS — R609 Edema, unspecified: Secondary | ICD-10-CM

## 2012-06-21 MED ORDER — OMEPRAZOLE 40 MG PO CPDR: 40.0000 mg | DELAYED_RELEASE_CAPSULE | Freq: Every day | ORAL | Status: DC

## 2012-06-21 MED ORDER — NITROGLYCERIN 0.4 MG SL SUBL: 0.4000 mg | SUBLINGUAL_TABLET | SUBLINGUAL | Status: DC | PRN

## 2012-06-21 NOTE — Patient Instructions (Addendum)
Request the catalog form [email or web address] to see if your stockings come in other trouser colors.    You can use Gas X or Mylanta Gas for your next episode of gas pain or esophageal pain,  If the pain does not resolve in 5 to 10 minutes , ask for the nitrostat pill to dissolve under your tongue.   I am changing the prilosec to 40 mg dose. Take 30 minutes before food or liquid. (first thing in the morning)

## 2012-06-21 NOTE — Progress Notes (Signed)
Patient ID: Sharon [name], female   DOB: March 28, 1916, 78 y.o.   MRN: [id number] Patient Active Problem List  Diagnosis  . Coronary artery disease, occlusive  . Rotator cuff arthropathy  . Hypertension  . Restless leg syndrome  . Edema of lower extremity  . Diabetes mellitus with end-stage renal disease  . Chronic kidney disease (CKD), stage IV (severe)  . Arrhythmia  . Right knee DJD    Subjective:  CC:   Chief Complaint  Patient presents with  . Follow-up    HPI:   Sharon [name] Christmasis a 78 y.o. female who presents for three-month followup on chronic conditions including diabetes mellitus, ischemic cardiomyopathy, chronic kidney disease, decreased mobility secondary to DJD of right knee, and lower extremity edema.  She has not had any recent falls. She is using her rolling walker and is living in assisted living at [facility].  She had an episode of chest pain yesterday,  Which was treated with pepcid and resolved after about an hour.  Episode occurring after walking back to her room from the laundry room.  She did not take any sublingual nitroglycerin because she did not recall that she had any to take. . She did not bring a log of her blood sugars with her today and does not know if they have been elevated.  She denies any episodes or symptoms  of hypoglycemia.    Past Medical History  Diagnosis Date  . Diabetes mellitus, type 2   . Diabetic nephropathy   . GERD (gastroesophageal reflux disease)   . Essential hypertension, benign   . Coronary atherosclerosis of native coronary artery   . Venous stasis   . Osteoarthritis     with chronic back pain  . Osteopenia   . Urge incontinence     mild  . History of CVA (cerebrovascular accident) 25  . Vitamin D deficiency   . Hyperlipidemia   . Vertigo   . Restless leg syndrome   . Anemia     Past Surgical History  Procedure Date  . Bladder repair 1988  . Total abdominal hysterectomy 1980  . Neck surgery 1991  . Lumbar  laminectomy   . Knee arthroscopy   . Spine surgery     lumbar laminectomy    The following portions of the patient's history were reviewed and updated as appropriate: Allergies, current medications, and problem list.  Review of Systems:   Patient denies headache, fevers, malaise, unintentional weight loss, skin rash, eye pain, sinus congestion and sinus pain, sore throat, dysphagia,  hemoptysis , cough, dyspnea, wheezing, palpitations, orthopnea, abdominal pain, nausea, melena, diarrhea, constipation, flank pain, dysuria, hematuria, urinary  Frequency, nocturia, numbness, tingling, seizures,  Focal weakness, Loss of consciousness,  Tremor, insomnia, depression, anxiety, and suicidal ideation.       History   Social History  . Marital Status: Widowed    Spouse Name: N/A    Number of Children: N/A  . Years of Education: N/A   Occupational History  . Not on file.   Social History Main Topics  . Smoking status: Never Smoker   . Smokeless tobacco: Never Used  . Alcohol Use: No  . Drug Use: No  . Sexually Active: Not on file   Other Topics Concern  . Not on file   Social History Narrative  . No narrative on file    Objective:  BP 160/60  Pulse 92  Temp 98.3 F (36.8 C) (Oral)  Resp 12  Ht 5' (  1.524 m)  Wt 185 lb 8 oz (84.142 kg)  BMI 36.23 kg/m2  SpO2 96%  General appearance: alert, cooperative and appears stated age Ears: normal TM's and external ear canals both ears Throat: lips, mucosa, and tongue normal; teeth and gums normal Neck: no adenopathy, no carotid bruit, supple, symmetrical, trachea midline and thyroid not enlarged, symmetric, no tenderness/mass/nodules Back: symmetric, no curvature. ROM normal. No CVA tenderness. Lungs: clear to auscultation bilaterally Heart: regular rate and rhythm, S1, S2 normal, no murmur, click, rub or gallop Abdomen: soft, non-tender; bowel sounds normal; no masses,  no organomegaly Pulses: 2+ and symmetric Skin: Skin color,  texture, turgor normal. No rashes or lesions Lymph nodes: Cervical, supraclavicular, and axillary nodes normal.  Assessment and Plan:  Coronary artery disease, occlusive And she has three-vessel disease and ischemic cardiomyopathy. Intervention has been deferred due to her age and multiple comorbidities including severe chronic kidney disease. A recent episode of chest pain is concerning for angina. I have reviewed her in the ER in the nitroglycerin is active. I have reminded her and her daughter-in-law that this medication can be used for symptom control. She is already taking Imdur, metoprolol , asa and lipitor.   Edema of lower extremity Multifactorial secondary to ischemic cardiomyopathy and chronic venous insufficiency. Managed with low-dose Lasix and daily use of venous compression stockings  Right knee DJD Significant tricompartmental disease with multiple ligament tears by recent MRI. She is not a candidate for surgery due to age, ischemic cardiomyopathy, and chronic kidney disease. She does not tolerate narcotics secondary to GI upset. Symptoms are currently controlled with minimal medications and use of walker. She has had physical therapy prescribed to prevent ongoing loss of function and tolerates this well. We will continue PT when necessary  Diabetes mellitus with end-stage renal disease She has had loss of control by today's A1c which is 8.7. We'll request a log of her blood sugars from facility. She needs to have it checked at least twice daily so I can adjust her insulin  Chronic kidney disease (CKD), stage IV (severe) With stable GFR by today's labs. She sees a nephrologist every 6 months.   Updated Medication List Outpatient Encounter Prescriptions as of 06/21/2012  Medication Sig Dispense Refill  . aspirin 81 MG tablet Take 81 mg by mouth daily.      Marland Kitchen atorvastatin (LIPITOR) 20 MG tablet Take 20 mg by mouth daily.      . ergocalciferol (VITAMIN D2) 50000 UNITS capsule  Take 50,000 Units by mouth every 30 (thirty) days.       . famotidine (PEPCID) 20 MG tablet Take 1 tablet (20 mg total) by mouth at bedtime.  30 tablet  4  . insulin glargine (LANTUS) 100 UNIT/ML injection Inject 24 Units into the skin at bedtime.      Marland Kitchen LASIX 40 MG tablet TAKE ONE TABLET BY MOUTH ONCE DAILY. (EDEMA/DIURETIC)  30 tablet  3  . metoprolol tartrate (LOPRESSOR) 25 MG tablet TAKE 1/2 TABLET BY MOUTH TWICE A DAY. (HIGH BLOOD PRESSURE)  60 tablet  2  . nitroGLYCERIN (NITROSTAT) 0.4 MG SL tablet Place 1 tablet (0.4 mg total) under the tongue every 5 (five) minutes as needed for chest pain.  50 tablet  3  . omeprazole (PRILOSEC) 40 MG capsule Take 1 capsule (40 mg total) by mouth daily.  90 capsule  3  . pramipexole (MIRAPEX) 0.25 MG tablet Take 0.5 mg by mouth at bedtime as needed.      Marland Kitchen  ranolazine (RANEXA) 500 MG 12 hr tablet Take 500 mg by mouth 2 (two) times daily.      . [DISCONTINUED] nitroGLYCERIN (NITROSTAT) 0.4 MG SL tablet Place 1 tablet (0.4 mg total) under the tongue every 5 (five) minutes as needed for chest pain.  50 tablet  3  . [DISCONTINUED] omeprazole (PRILOSEC) 20 MG capsule Take 1 capsule (20 mg total) by mouth daily.  90 capsule  3  . IMDUR 60 MG 24 hr tablet TAKE 1 & 1/2 TABLETS BY MOUTH EVERY DAY IN THE MORNING FOR HEART.  45 tablet  3  . ondansetron (ZOFRAN) 4 MG tablet Take 4 mg by mouth every 8 (eight) hours as needed.      . traMADol (ULTRAM) 50 MG tablet Take 50 mg by mouth every 8 (eight) hours as needed.         Orders Placed This Encounter  Procedures  . Hemoglobin A1c  . Troponin I    No Follow-up on file.

## 2012-06-22 LAB — HEMOGLOBIN A1C: Hgb A1c MFr Bld: 8.7 % — ABNORMAL HIGH (ref 4.6–6.5)

## 2012-06-23 ENCOUNTER — Encounter: Payer: Self-pay | Admitting: Internal Medicine

## 2012-06-23 DIAGNOSIS — M1711 Unilateral primary osteoarthritis, right knee: Secondary | ICD-10-CM | POA: Insufficient documentation

## 2012-06-23 NOTE — Assessment & Plan Note (Addendum)
Significant tricompartmental disease with multiple ligament tears by recent MRI. She is not a candidate for surgery due to age, ischemic cardiomyopathy, and chronic kidney disease. She does not tolerate narcotics secondary to GI upset. Symptoms are currently controlled with minimal medications and use of walker. She has had physical therapy prescribed to prevent ongoing loss of function and tolerates this well. We will continue PT when necessary.  She cannot climb stairs due to her severe joint disease complicated by ischemic cardiomyopathy. She is requesting a letter for insurance coverage of a chair lift which her daughter has had installed in her home for patient's upcoming visit.

## 2012-06-23 NOTE — Assessment & Plan Note (Signed)
With stable GFR by today's labs. She sees a nephrologist every 6 months.

## 2012-06-23 NOTE — Assessment & Plan Note (Addendum)
And she has three-vessel disease and ischemic cardiomyopathy. Intervention has been deferred due to her age and multiple comorbidities including severe chronic kidney disease. A recent episode of chest pain is concerning for angina. I have reviewed her in the ER in the nitroglycerin is active. I have reminded her and her daughter-in-law that this medication can be used for symptom control. She is already taking Imdur, metoprolol , asa and lipitor.

## 2012-06-23 NOTE — Assessment & Plan Note (Signed)
Multifactorial secondary to ischemic cardiomyopathy and chronic venous insufficiency. Managed with low-dose Lasix and daily use of venous compression stockings

## 2012-06-23 NOTE — Assessment & Plan Note (Signed)
She has had loss of control by today's A1c which is 8.7. We'll request a log of her blood sugars from facility. She needs to have it checked at least twice daily so I can adjust her insulin

## 2012-06-28 ENCOUNTER — Other Ambulatory Visit: Payer: Self-pay | Admitting: Internal Medicine

## 2012-08-01 ENCOUNTER — Other Ambulatory Visit: Payer: Self-pay | Admitting: Internal Medicine

## 2012-08-03 NOTE — Telephone Encounter (Signed)
Med filled.  

## 2012-08-12 IMAGING — US US RENAL KIDNEY
1 series · 14 of 25 positions shown · non-contrast
Comparison: none

REASON FOR EXAM: renal failure, eval for hydroneph
COMMENTS:

[Series 1: us renal kidney · 0.26mm/px · 14 of 52 slices shown]
[im 1/52]
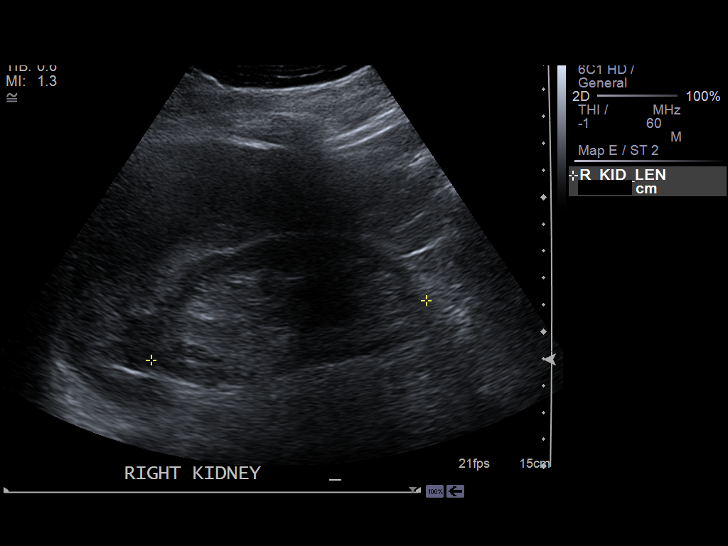
[im 5/52]
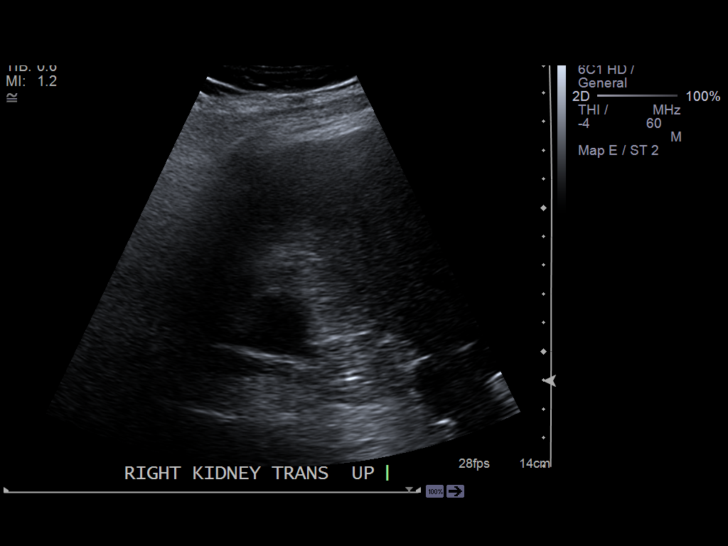
[im 9/52]
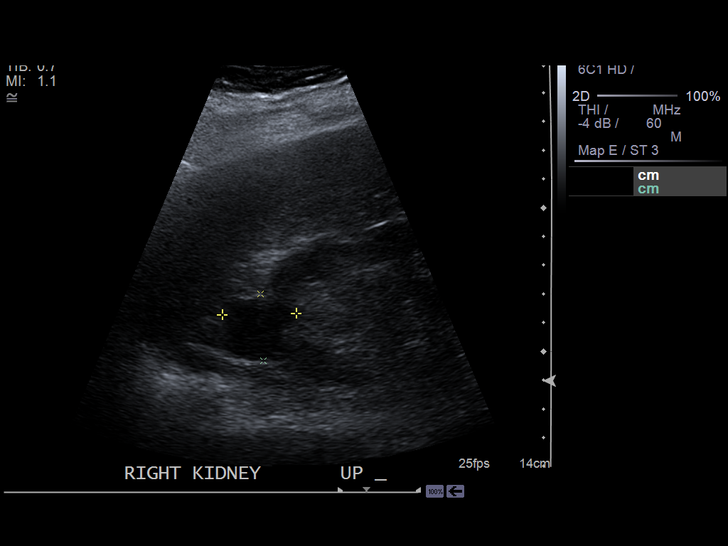
[im 13/52]
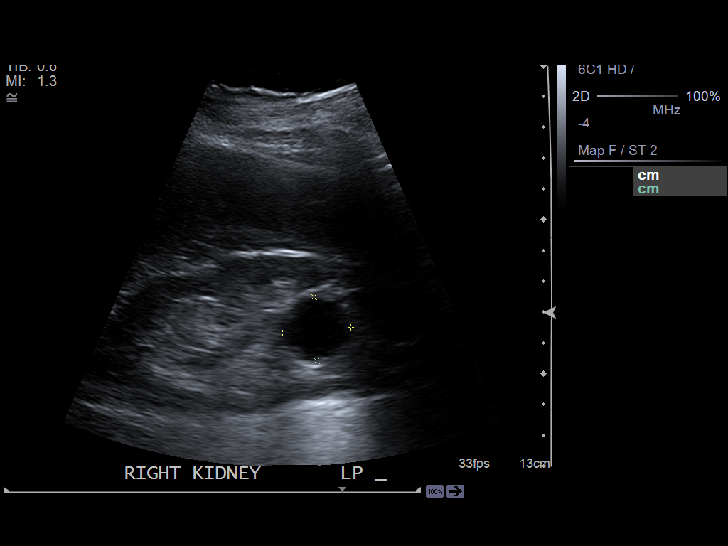
[im 18/52]
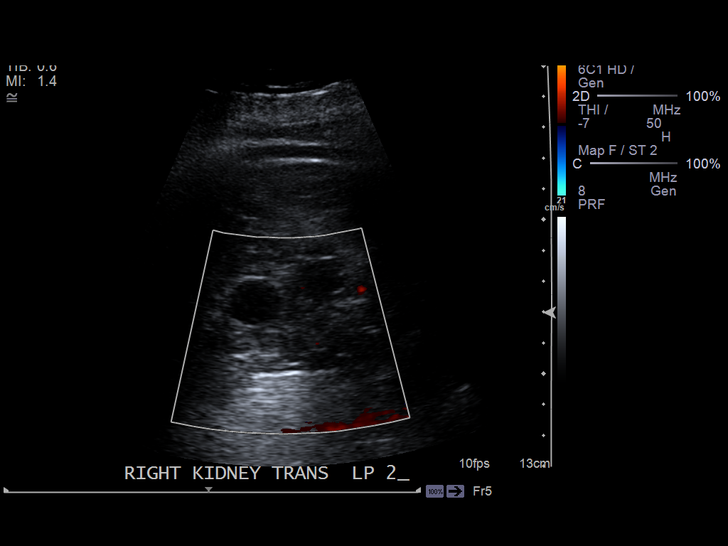
[im 20/52]
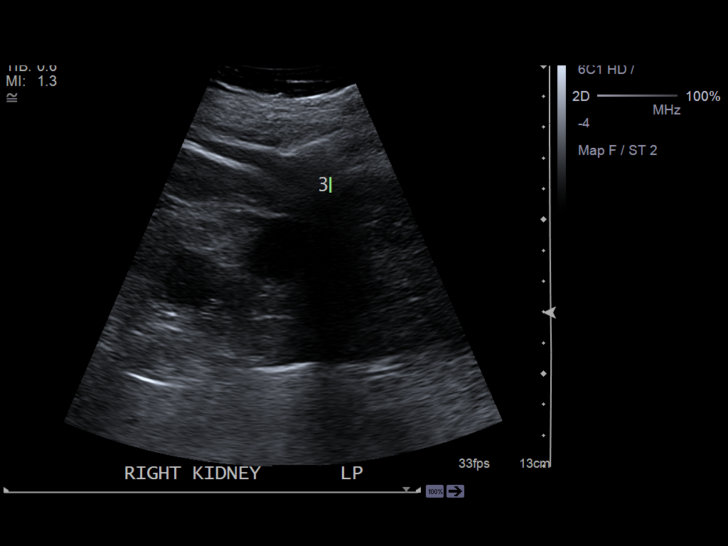
[im 24/52]
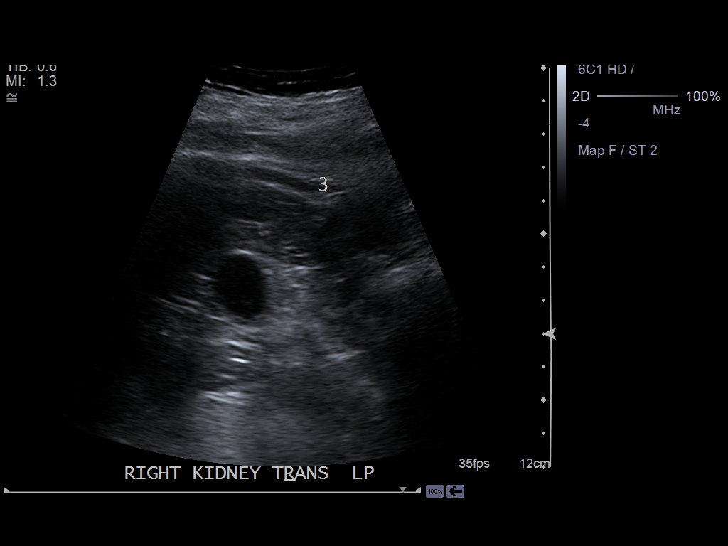
[im 28/52]
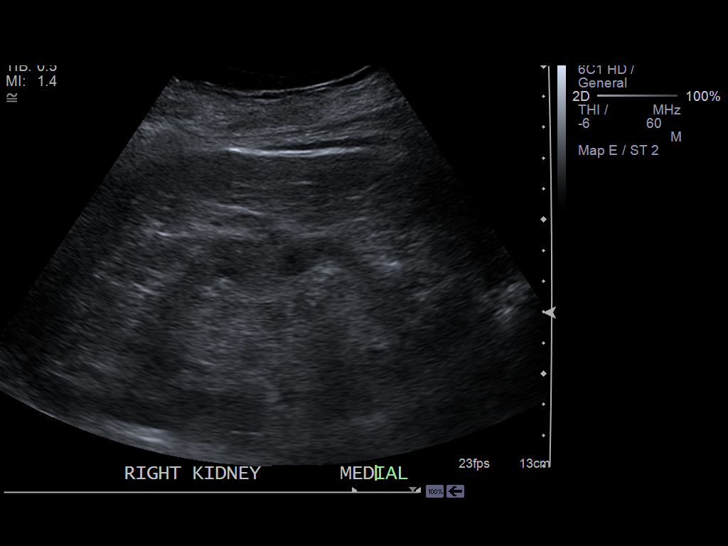
[im 32/52]
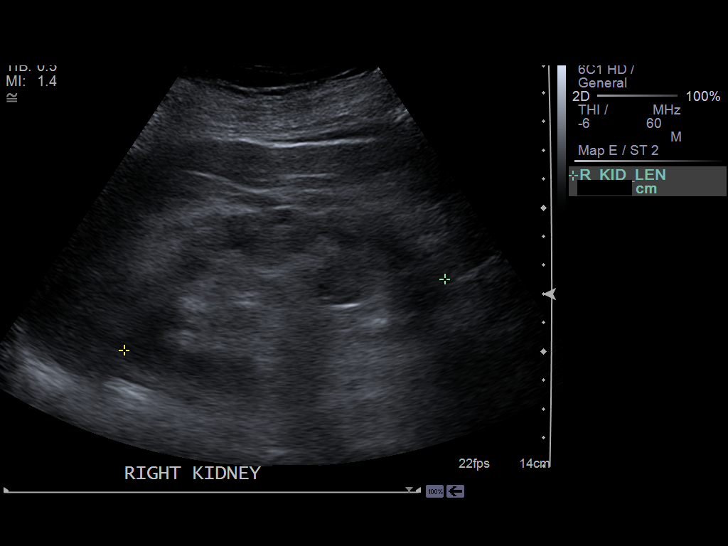
[im 35/52]
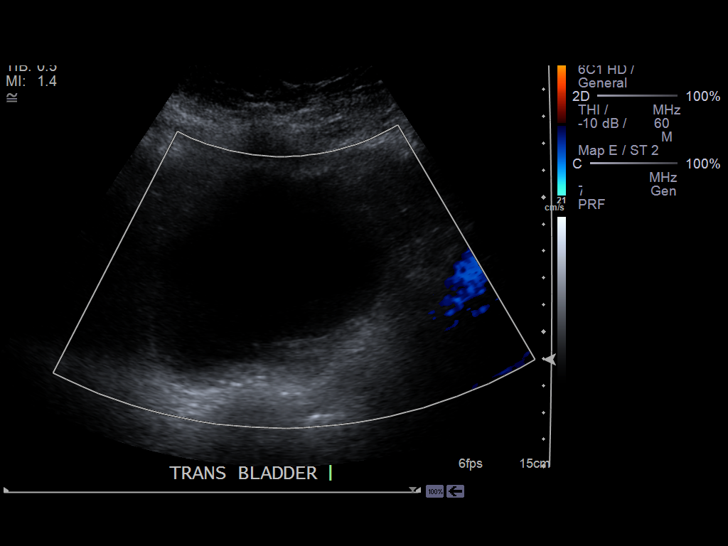
[im 39/52]
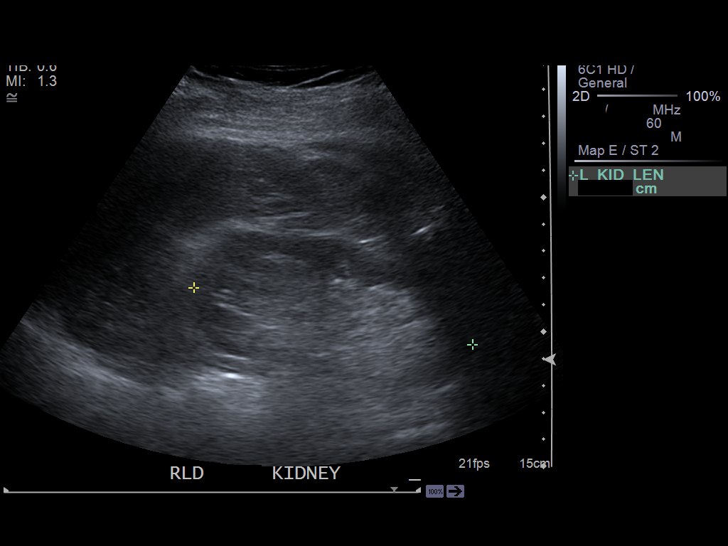
[im 43/52]
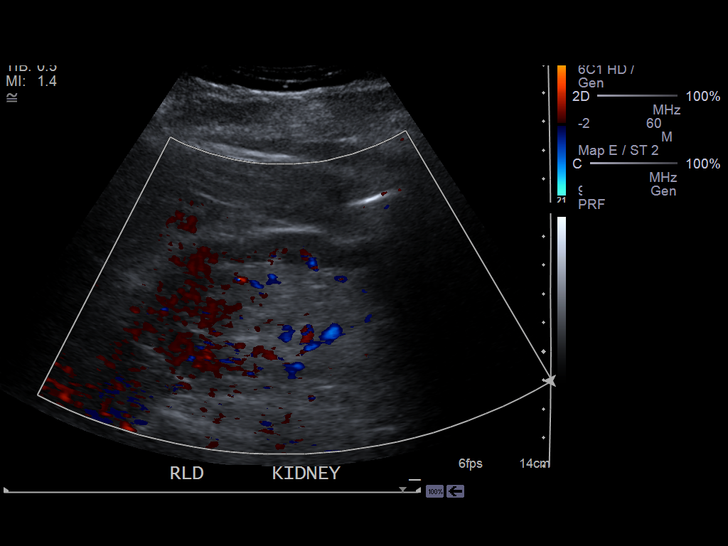
[im 47/52]
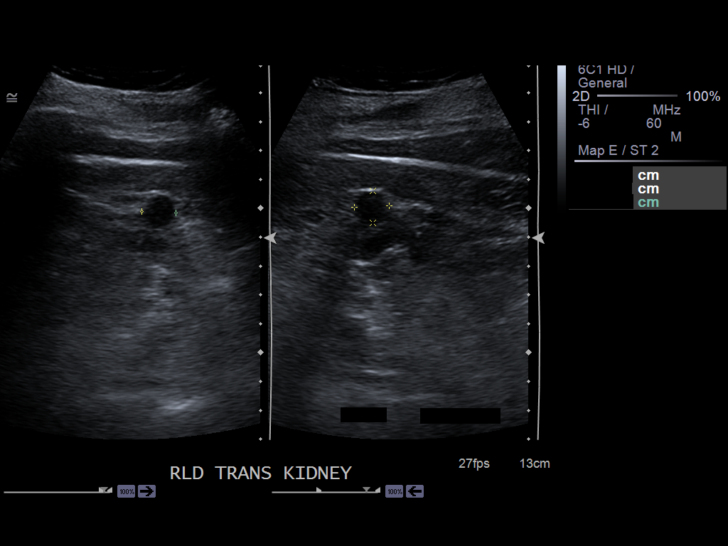
[im 52/52]
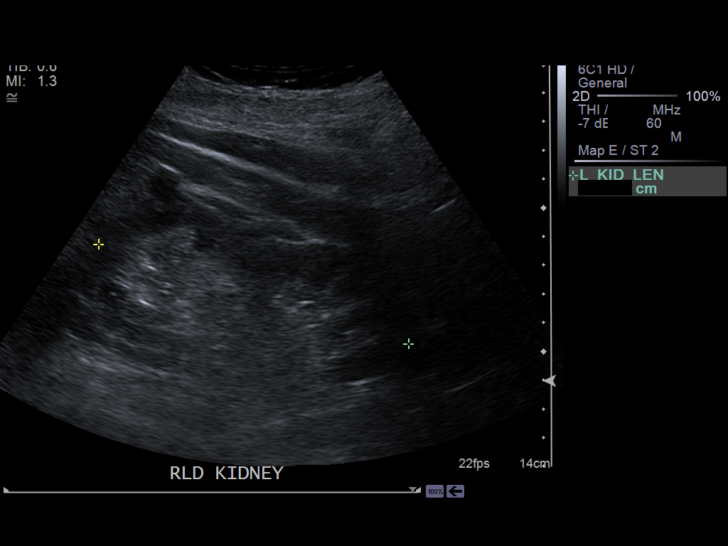

[14 of 25 positions shown; findings below may reference images not displayed]

PROCEDURE:     US  - US KIDNEY  - February 03, 2012  [DATE]

RESULT:     Comparison: None

Technique and findings: Multiple gray-scale and color doppler images of the
kidneys were obtained.

The right kidney measures 11.4 x 5.5 x 5.2 cm and the left kidney measures
11.3 x 5.8 x 5.3 cm. The kidneys are normal in echogenicity. There is no
hydronephrosis.  There are no echogenic foci. There are multiple bilateral
anechoic masses with the largest on the right measuring 2.6 cm and the
largest on the left measuring 1.2 cm. There is no free fluid in the region
of the renal fossa.
IMPRESSION: Bilateral renal cysts.

Otherwise normal renal ultrasound.

[REDACTED]

## 2012-08-22 ENCOUNTER — Encounter: Payer: Self-pay | Admitting: Adult Health

## 2012-08-22 ENCOUNTER — Ambulatory Visit (INDEPENDENT_AMBULATORY_CARE_PROVIDER_SITE_OTHER): Payer: Medicare Other | Admitting: Adult Health

## 2012-08-22 VITALS — BP 128/61 | HR 82 | Temp 99.3°F | Resp 16 | Ht 61.0 in | Wt 185.0 lb

## 2012-08-22 DIAGNOSIS — J329 Chronic sinusitis, unspecified: Secondary | ICD-10-CM | POA: Insufficient documentation

## 2012-08-22 MED ORDER — AMOXICILLIN-POT CLAVULANATE 500-125 MG PO TABS: ORAL_TABLET | ORAL | Status: DC

## 2012-08-22 MED ORDER — HYDROCODONE-HOMATROPINE 5-1.5 MG/5ML PO SYRP: 5.0000 mL | ORAL_SOLUTION | Freq: Three times a day (TID) | ORAL | Status: DC | PRN

## 2012-08-22 NOTE — Progress Notes (Signed)
Subjective:    Patient ID: Sharon [name], female    DOB: [birth year], 79 y.o.   MRN: [id number]  HPI  [name] is a very pleasant 79 y/o patient who presents to clinic with c/o cough, congestion, rhinorrhea, alterations in her voice. Cough is keeping her up at night. Patient denies fever, chills. Patient reports symptoms began this past Saturday. She has only been taking some OTC cough medicine. Pt did get her flu shot this season. Her son is present during visit. He reports that patient has a hx of having symptoms move quickly into her chest and creating further complications.   Current Outpatient Prescriptions on File Prior to Visit  Medication Sig Dispense Refill  . aspirin 81 MG tablet Take 81 mg by mouth daily.      . ergocalciferol (VITAMIN D2) 50000 UNITS capsule Take 50,000 Units by mouth every 30 (thirty) days.       . famotidine (PEPCID) 20 MG tablet Take 1 tablet (20 mg total) by mouth at bedtime.  30 tablet  4  . IMDUR 60 MG 24 hr tablet TAKE 1 & 1/2 TABLETS BY MOUTH EVERY DAY IN THE MORNING FOR HEART.  45 tablet  3  . insulin glargine (LANTUS) 100 UNIT/ML injection Inject 24 Units into the skin at bedtime.      Marland Kitchen LASIX 40 MG tablet TAKE ONE TABLET BY MOUTH ONCE DAILY. (EDEMA/DIURETIC)  30 tablet  3  . LIPITOR 20 MG tablet TAKE ONE TABLET BY MOUTH EACH DAY. (IMPROVES CHOLESTEROL)  30 tablet  3  . metoprolol tartrate (LOPRESSOR) 25 MG tablet TAKE 1/2 TABLET BY MOUTH TWICE A DAY. (HIGH BLOOD PRESSURE)  60 tablet  0  . nitroGLYCERIN (NITROSTAT) 0.4 MG SL tablet Place 1 tablet (0.4 mg total) under the tongue every 5 (five) minutes as needed for chest pain.  50 tablet  3  . omeprazole (PRILOSEC) 40 MG capsule Take 1 capsule (40 mg total) by mouth daily.  90 capsule  3  . ondansetron (ZOFRAN) 4 MG tablet Take 4 mg by mouth every 8 (eight) hours as needed.      . pramipexole (MIRAPEX) 0.25 MG tablet Take 0.5 mg by mouth at bedtime as needed.      . ranolazine (RANEXA) 500 MG 12 hr  tablet Take 500 mg by mouth 2 (two) times daily.      . traMADol (ULTRAM) 50 MG tablet Take 50 mg by mouth every 8 (eight) hours as needed.        Review of Systems  Constitutional: Positive for activity change. Negative for fever and chills.  HENT: Positive for congestion, rhinorrhea, voice change, postnasal drip and sinus pressure. Negative for ear pain and sore throat.   Eyes: Negative.   Respiratory: Positive for cough.        Rib pain when coughing  Cardiovascular: Negative for chest pain.  Gastrointestinal: Negative.   Genitourinary: Negative.   Psychiatric/Behavioral: Negative.     BP 128/61  Pulse 82  Temp 99.3 F (37.4 C) (Oral)  Resp 16  Ht 5\' 1"  (1.549 m)  Wt 185 lb (83.915 kg)  BMI 34.96 kg/m2  SpO2 99%     Objective:   Physical Exam  Constitutional: She is oriented to person, place, and time. She appears well-developed and well-nourished. No distress.  HENT:  Head: Normocephalic and atraumatic.  Right Ear: External ear normal.  Left Ear: External ear normal.       Nasal mucosa injected.   Eyes:  Conjunctivae normal are normal. Pupils are equal, round, and reactive to light.  Neck: Normal range of motion. No tracheal deviation present.  Cardiovascular: Normal rate, regular rhythm and normal heart sounds.   Pulmonary/Chest: Effort normal and breath sounds normal. No respiratory distress. She has no wheezes. She has no rales.       Rhonchi RML, clears with coughing.  Abdominal: Soft. Bowel sounds are normal.  Musculoskeletal:       Trace edema bil LE. Compression stockings in use.  Lymphadenopathy:    She has no cervical adenopathy.  Neurological: She is alert and oriented to person, place, and time.  Skin: Skin is warm and dry.  Psychiatric: She has a normal mood and affect. Her behavior is normal. Judgment and thought content normal.       Assessment & Plan:

## 2012-08-22 NOTE — Patient Instructions (Addendum)
Please start the antibiotic today. You will take this for 7 days.  I have prescribed Hycodan for your cough. This medication has hydrocodone and can make you drowsy. You may take it every 8 hours as needed. However, if your cough is worse at night I would just take this medication at bedtime. Please be careful if you get up to go to the bathroom.   What is sinusitis? - Sinusitis is a condition that can cause a stuffy nose, pain in the face, and yellow or green discharge (mucus) from the nose. The sinuses are hollow areas in the bones of the face. They have a thin lining that normally makes a small amount of mucus. When this lining gets infected, it swells and makes extra mucus. This causes symptoms.   Sinusitis can occur when a person gets sick with a cold. The germs causing the cold can also infect the sinuses. Many times, a person feels like his or her cold is getting better. But then he or she gets sinusitis and begins to feel sick again.  What are the symptoms of sinusitis? - Common symptoms of sinusitis include: Stuffy or blocked nose  Thick yellow or green discharge from the nose  Pain in the teeth  Pain or pressure in the face - This often feels worse when a person bends forward.   People with sinusitis can also have other symptoms that include: Fever  Cough  Trouble smelling  Ear pressure or fullness  Headache  Bad breath  Feeling tired   Most of the time, symptoms start to improve in 7 to 10 days.  Should I see a doctor or nurse? - See your doctor or nurse if your symptoms last more than 7 days, or if your symptoms get better at first but then get worse. Sometimes, sinusitis can lead to serious problems. See your doctor or nurse right away (do not wait 7 days) if you have: Fever higher than 102.38F (39.2C)  Sudden and severe pain in the face and head  Trouble seeing or seeing double  Trouble thinking clearly  Swelling or redness around 1 or both eyes  Trouble breathing  or a stiff neck   Is there anything I can do on my own to feel better? - Yes. To reduce your symptoms, you can: Take an over-the-counter pain reliever to reduce the pain  Rinse your nose and sinuses with salt water a few times a day - Ask your doctor or nurse about the best way to do this.  Use a decongestant nose spray - These sprays are sold in a pharmacy. But do not use decongestant nose sprays for more than 2 to 3 days in a row. Using them more than 3 days in a row can make symptoms worse.   You should NOT take an antihistamine for sinusitis. Common antihistamines include diphenhydramine (sample brand name: Benadryl), chlorpheniramine (sample brand name: Chlor-Trimeton), loratadine (sample brand name: Claritin), and cetirizine (sample brand name: Zyrtec). They can treat allergies, but not sinus infections, and could increase your discomfort by drying the lining of your nose and sinuses, or making you tired.   Your doctor might also prescribe a steroid nose spray to reduce the swelling in your nose. (Steroid nose sprays do not contain the same steroids that athletes take to build muscle.)  How is sinusitis treated? - Most of the time, sinusitis does not need to be treated with antibiotic medicines. This is because most sinusitis is caused by viruses - not  bacteria - and antibiotics do not kill viruses. Many people get over sinus infections without antibiotics.  Some people with sinusitis do need treatment with antibiotics. If your symptoms have not improved after 7 to 10 days, ask your doctor if you should take antibiotics. Your doctor might recommend that you wait 1 more week to see if your symptoms improve. But if you have symptoms such as a fever or a lot of pain, he or she might prescribe antibiotics. It is important to follow your doctor's instructions about taking your antibiotics.  What if my symptoms do not get better? - If your symptoms do not get better, talk with your doctor or nurse.  He or she might order tests to figure out why you still have symptoms. These can include:  CT scan or other imaging tests - Imaging tests create pictures of the inside of the body.  A test to look inside the sinuses - For this test, a doctor puts a thin tube with a camera on the end into the nose and up into the sinuses.   Some people get a lot of sinus infections or have symptoms that last at least 3 months. These people can have a different type of sinusitis called "chronic sinusitis." Chronic sinusitis can be caused by different things. For example, some people have growths in their nose or sinuses that are called "polyps." Other people have allergies that cause their symptoms.  Chronic sinusitis can be treated in different ways. If you have chronic sinusitis, talk with your doctor about which treatments are right for you.

## 2012-08-22 NOTE — Assessment & Plan Note (Signed)
Sinusitis with bronchitis. Start augmentin. Dose adjustment for kidney function. Prescription for hycodan cough syrup provided. RTC if no improvement in symptoms within 3-4 days.

## 2012-09-02 ENCOUNTER — Telehealth: Payer: Self-pay | Admitting: General Practice

## 2012-09-02 DIAGNOSIS — L309 Dermatitis, unspecified: Secondary | ICD-10-CM

## 2012-09-02 MED ORDER — DESOXIMETASONE 0.25 % EX CREA: TOPICAL_CREAM | Freq: Two times a day (BID) | CUTANEOUS | Status: DC

## 2012-09-02 NOTE — Telephone Encounter (Signed)
done

## 2012-09-02 NOTE — Telephone Encounter (Signed)
Pt nurse called asking if she could have an Rx for Topicort for eczema [facility]. Please advise.

## 2012-09-13 ENCOUNTER — Other Ambulatory Visit: Payer: Self-pay | Admitting: *Deleted

## 2012-09-15 MED ORDER — RANOLAZINE ER 500 MG PO TB12: 500.0000 mg | ORAL_TABLET | Freq: Two times a day (BID) | ORAL | Status: DC

## 2012-09-15 NOTE — Telephone Encounter (Signed)
Med filled.  

## 2012-09-21 ENCOUNTER — Ambulatory Visit (INDEPENDENT_AMBULATORY_CARE_PROVIDER_SITE_OTHER): Payer: Medicare Other | Admitting: Internal Medicine

## 2012-09-21 ENCOUNTER — Encounter: Payer: Self-pay | Admitting: Internal Medicine

## 2012-09-21 VITALS — BP 140/68 | HR 68 | Temp 97.9°F | Resp 12 | Wt 188.0 lb

## 2012-09-21 DIAGNOSIS — I219 Acute myocardial infarction, unspecified: Secondary | ICD-10-CM

## 2012-09-21 DIAGNOSIS — R609 Edema, unspecified: Secondary | ICD-10-CM

## 2012-09-21 DIAGNOSIS — M1710 Unilateral primary osteoarthritis, unspecified knee: Secondary | ICD-10-CM

## 2012-09-21 DIAGNOSIS — M1711 Unilateral primary osteoarthritis, right knee: Secondary | ICD-10-CM

## 2012-09-21 DIAGNOSIS — E559 Vitamin D deficiency, unspecified: Secondary | ICD-10-CM

## 2012-09-21 DIAGNOSIS — I499 Cardiac arrhythmia, unspecified: Secondary | ICD-10-CM

## 2012-09-21 DIAGNOSIS — N184 Chronic kidney disease, stage 4 (severe): Secondary | ICD-10-CM

## 2012-09-21 DIAGNOSIS — E1159 Type 2 diabetes mellitus with other circulatory complications: Secondary | ICD-10-CM

## 2012-09-21 DIAGNOSIS — IMO0002 Reserved for concepts with insufficient information to code with codable children: Secondary | ICD-10-CM

## 2012-09-21 DIAGNOSIS — I2510 Atherosclerotic heart disease of native coronary artery without angina pectoris: Secondary | ICD-10-CM

## 2012-09-21 DIAGNOSIS — E1329 Other specified diabetes mellitus with other diabetic kidney complication: Secondary | ICD-10-CM

## 2012-09-21 DIAGNOSIS — R600 Localized edema: Secondary | ICD-10-CM

## 2012-09-21 LAB — RENAL FUNCTION PANEL
Albumin: 3.3 g/dL — ABNORMAL LOW (ref 3.5–5.2)
BUN: 29 mg/dL — ABNORMAL HIGH (ref 6–23)
Creatinine, Ser: 2 mg/dL — ABNORMAL HIGH (ref 0.4–1.2)
Glucose, Bld: 200 mg/dL — ABNORMAL HIGH (ref 70–99)
Phosphorus: 2.7 mg/dL (ref 2.3–4.6)

## 2012-09-21 LAB — COMPREHENSIVE METABOLIC PANEL
ALT: 14 U/L (ref 0–35)
Albumin: 3.3 g/dL — ABNORMAL LOW (ref 3.5–5.2)
CO2: 30 mEq/L (ref 19–32)
Calcium: 8.8 mg/dL (ref 8.4–10.5)
Chloride: 100 mEq/L (ref 96–112)
GFR: 24.34 mL/min — ABNORMAL LOW (ref >60.00)
Glucose, Bld: 199 mg/dL — ABNORMAL HIGH (ref 70–99)
Sodium: 139 mEq/L (ref 135–145)
Total Protein: 6.2 g/dL (ref 6.0–8.3)

## 2012-09-21 NOTE — Progress Notes (Signed)
Patient ID: Sharon [name], female   DOB: 09-11-1915, 79 y.o.   MRN: [id number]   Patient Active Problem List  Diagnosis  . Coronary artery disease, occlusive  . Rotator cuff arthropathy  . Hypertension  . Restless leg syndrome  . Edema of lower extremity  . Uncontrolled secondary diabetes mellitus with stage 4 CKD (GFR 15-29)  . Chronic kidney disease (CKD), stage IV (severe)  . Arrhythmia  . Right knee DJD  . Sinusitis    Subjective:  CC:   Chief Complaint  Patient presents with  . Follow-up    3 months    HPI:   Sharon [name] a 79 y.o. female who presents for follow up on chronic conditions including ischemic cardiomyopathy, diabetes mellitus and chronic pain due to DJD bilateral knees.  She has been indulging in desserts  anddescribes her diet as nondiabetic. She is regularly  eating bagels,  Oatmeal, and honeynut Cheerios for breakfast. Her home blood sugars have been ranging from 119 to as high as 306.   2) she continues to weigh herself daily and her weight has fluctuated from 184 287. She denies dyspnea and orthopnea.  3) she was Treated one month ago for sinusitis by our nurse practitioner with  empiric antibiotics and Hycodan cough suppressant. All symptoms have resolved without any sequelae including diarrhea and persistent cough.   Cc: left nipple has been painful  when exposed to cold.  Not the right .   there is no history of mastitis or breast cancer.     Past Medical History  Diagnosis Date  . Diabetes mellitus, type 2   . Diabetic nephropathy   . GERD (gastroesophageal reflux disease)   . Essential hypertension, benign   . Coronary atherosclerosis of native coronary artery   . Venous stasis   . Osteoarthritis     with chronic back pain  . Osteopenia   . Urge incontinence     mild  . History of CVA (cerebrovascular accident) 52  . Vitamin D deficiency   . Hyperlipidemia   . Vertigo   . Restless leg syndrome   . Anemia     Past Surgical  History  Procedure Laterality Date  . Bladder repair  1988  . Total abdominal hysterectomy  1980  . Neck surgery  1991  . Lumbar laminectomy    . Knee arthroscopy    . Spine surgery      lumbar laminectomy       The following portions of the patient's history were reviewed and updated as appropriate: Allergies, current medications, and problem list.    Review of Systems:   Patient denies headache, fevers, malaise, unintentional weight loss, skin rash, eye pain, sinus congestion and sinus pain, sore throat, dysphagia,  hemoptysis , cough, dyspnea, wheezing, chest pain, palpitations, orthopnea, edema, abdominal pain, nausea, melena, diarrhea, constipation, flank pain, dysuria, hematuria, urinary  Frequency, nocturia, numbness, tingling, seizures,  Focal weakness, Loss of consciousness,  Tremor, insomnia, depression, anxiety, and suicidal ideation.      History   Social History  . Marital Status: Widowed    Spouse Name: N/A    Number of Children: N/A  . Years of Education: N/A   Occupational History  . Not on file.   Social History Main Topics  . Smoking status: Never Smoker   . Smokeless tobacco: Never Used  . Alcohol Use: No  . Drug Use: No  . Sexually Active: Not on file   Other Topics Concern  .  Not on file   Social History Narrative  . No narrative on file    Objective:  BP 140/68  Pulse 68  Temp(Src) 97.9 F (36.6 C) (Oral)  Resp 12  Wt 188 lb (85.276 kg)  BMI 35.54 kg/m2  SpO2 96%  General appearance: alert, cooperative and appears stated age Ears: normal TM's and external ear canals both ears Throat: lips, mucosa, and tongue normal; teeth and gums normal Neck: no adenopathy, no carotid bruit, supple, symmetrical, trachea midline and thyroid not enlarged, symmetric, no tenderness/mass/nodules Back: symmetric, no curvature. ROM normal. No CVA tenderness. Lungs: clear to auscultation bilaterally Heart: regular rate and rhythm, S1, S2 normal, no  murmur, click, rub or gallop Abdomen: soft, non-tender; bowel sounds normal; no masses,  no organomegaly Pulses: 2+ and symmetric Skin: Skin color, texture, turgor normal. No rashes or lesions Lymph nodes: Cervical, supraclavicular, and axillary nodes normal.  Assessment and Plan:  Uncontrolled secondary diabetes mellitus with stage 4 CKD (GFR 15-29) Her A1c has not improved but is actually risen from 8.7 to 8.8 over the last 3 months. 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 I am increasing her Lantus dose to 30 units daily.  I have asked the facility to see only sugar-free desserts. Given her extreme age our goal is to get her A1c below 8. She has a history of hyperkalemia and hypotension with ACE inhibitors and angiotensin receptor blockers.  Coronary artery disease, occlusive She has not had any recent episodes of chest pain. Her weight has been relatively stable.  Right knee DJD She did note note improvement in mobility with PT sessions. However she has become more sedentary since her physical therapy has stopped. We will resume PT once her insurance will cover additional sessions. Continue analgesics for pain control, avoiding nonsteroidals.  Edema of lower extremity Continue daily Lasix and use of compression stockings.  A total of 40 minutes was spent with patient more than half of which was spent in counseling, reviewing records from other providers and coordination of care.  Updated Medication List Outpatient Encounter Prescriptions as of 09/21/2012  Medication Sig Dispense Refill  . aspirin 81 MG tablet Take 81 mg by mouth daily.      Marland Kitchen desoximetasone (TOPICORT) 0.25 % cream Apply topically 2 (two) times daily. As needed for eczema  60 g  3  . ergocalciferol (VITAMIN D2) 50000 UNITS capsule Take 50,000 Units by mouth every 30 (thirty) days.       . famotidine (PEPCID) 20 MG tablet Take 1 tablet (20 mg total) by mouth at bedtime.  30 tablet  4  .  IMDUR 60 MG 24 hr tablet TAKE 1 & 1/2 TABLETS BY MOUTH EVERY DAY IN THE MORNING FOR HEART.  45 tablet  3  . insulin glargine (LANTUS) 100 UNIT/ML injection Inject 24 Units into the skin at bedtime.      Marland Kitchen LASIX 40 MG tablet TAKE ONE TABLET BY MOUTH ONCE DAILY. (EDEMA/DIURETIC)  30 tablet  3  . LIPITOR 20 MG tablet TAKE ONE TABLET BY MOUTH EACH DAY. (IMPROVES CHOLESTEROL)  30 tablet  3  . metoprolol tartrate (LOPRESSOR) 25 MG tablet TAKE 1/2 TABLET BY MOUTH TWICE A DAY. (HIGH BLOOD PRESSURE)  60 tablet  0  . nitroGLYCERIN (NITROSTAT) 0.4 MG SL tablet Place 1 tablet (0.4 mg total) under the tongue every 5 (five) minutes as needed for chest pain.  50 tablet  3  . omeprazole (PRILOSEC) 40 MG capsule Take 1 capsule (40  mg total) by mouth daily.  90 capsule  3  . ondansetron (ZOFRAN) 4 MG tablet Take 4 mg by mouth every 8 (eight) hours as needed.      . pramipexole (MIRAPEX) 0.25 MG tablet Take 2 at bedtime a needed      . ranolazine (RANEXA) 500 MG 12 hr tablet Take 1 tablet (500 mg total) by mouth 2 (two) times daily.  60 tablet  2  . traMADol (ULTRAM) 50 MG tablet Take 50 mg by mouth every 8 (eight) hours as needed.      Marland Kitchen HYDROcodone-homatropine (HYCODAN) 5-1.5 MG/5ML syrup Take 5 mLs by mouth every 8 (eight) hours as needed for cough.  120 mL  0  . [DISCONTINUED] amoxicillin-clavulanate (AUGMENTIN) 500-125 MG per tablet Take 1 tablet every 12 hours for 7 days.  14 tablet  0   No facility-administered encounter medications on file as of 09/21/2012.     Orders Placed This Encounter  Procedures  . Comprehensive metabolic panel  . Hemoglobin A1c  . IgG, IgA, IgM  . IFE Interpretation    Return in about 3 months (around 12/19/2012).

## 2012-09-21 NOTE — Patient Instructions (Addendum)
Please flush your sinuses once or twice daily with Simply Saline to prevent infection  Your breast exam was normal.  I did not feel anything suspicious  We are checking your a1c and kidney funciton today  Please moderate your indulgences in bagels and cheerios ton one serving daily

## 2012-09-23 LAB — IGG, IGA, IGM
IgA: 62 mg/dL — ABNORMAL LOW (ref 69–380)
IgG (Immunoglobin G), Serum: 863 mg/dL (ref 690–1700)

## 2012-09-23 LAB — IFE INTERPRETATION

## 2012-09-23 LAB — PROTEIN ELECTROPHORESIS, SERUM, WITH REFLEX
Albumin ELP: 58.4 % (ref 55.8–66.1)
Alpha-1-Globulin: 4.5 % (ref 2.9–4.9)
Alpha-2-Globulin: 13.3 % — ABNORMAL HIGH (ref 7.1–11.8)
Total Protein, Serum Electrophoresis: 6.1 g/dL (ref 6.0–8.3)

## 2012-09-24 ENCOUNTER — Encounter: Payer: Self-pay | Admitting: Internal Medicine

## 2012-09-24 NOTE — Assessment & Plan Note (Addendum)
She did note note improvement in mobility with PT sessions. However she has become more sedentary since her physical therapy has stopped. We will resume PT once her insurance will cover additional sessions. Continue analgesics for pain control, avoiding nonsteroidals.

## 2012-09-24 NOTE — Assessment & Plan Note (Addendum)
Her A1c has not improved but is actually risen from 8.7 to 8.8 over the last 3 months. 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 I am increasing her Lantus dose to 30 units daily.  I have asked the facility to see only sugar-free desserts. Given her extreme age our goal is to get her A1c below 8. She has a history of hyperkalemia and hypotension with ACE inhibitors and angiotensin receptor blockers.

## 2012-09-24 NOTE — Assessment & Plan Note (Signed)
She has not had any recent episodes of chest pain. Her weight has been relatively stable.

## 2012-09-24 NOTE — Assessment & Plan Note (Signed)
Continue daily Lasix and use of compression stockings.

## 2012-10-04 ENCOUNTER — Other Ambulatory Visit: Payer: Self-pay | Admitting: Internal Medicine

## 2012-10-04 NOTE — Telephone Encounter (Signed)
Sent in to pharmacy.  

## 2012-10-07 ENCOUNTER — Other Ambulatory Visit: Payer: Self-pay | Admitting: Internal Medicine

## 2012-10-07 NOTE — Telephone Encounter (Signed)
Med filled.  

## 2012-10-17 ENCOUNTER — Ambulatory Visit: Payer: Medicare Other | Admitting: Cardiovascular Disease

## 2012-10-20 ENCOUNTER — Other Ambulatory Visit: Payer: Self-pay | Admitting: Internal Medicine

## 2012-10-20 NOTE — Telephone Encounter (Signed)
Med filled.  

## 2012-11-04 ENCOUNTER — Ambulatory Visit (INDEPENDENT_AMBULATORY_CARE_PROVIDER_SITE_OTHER): Payer: Medicare Other | Admitting: Cardiovascular Disease

## 2012-11-04 ENCOUNTER — Encounter: Payer: Self-pay | Admitting: Cardiovascular Disease

## 2012-11-04 VITALS — BP 152/68 | HR 85 | Ht 60.0 in | Wt 185.5 lb

## 2012-11-04 DIAGNOSIS — R600 Localized edema: Secondary | ICD-10-CM

## 2012-11-04 DIAGNOSIS — I2510 Atherosclerotic heart disease of native coronary artery without angina pectoris: Secondary | ICD-10-CM

## 2012-11-04 DIAGNOSIS — K3189 Other diseases of stomach and duodenum: Secondary | ICD-10-CM

## 2012-11-04 DIAGNOSIS — E785 Hyperlipidemia, unspecified: Secondary | ICD-10-CM | POA: Insufficient documentation

## 2012-11-04 DIAGNOSIS — N184 Chronic kidney disease, stage 4 (severe): Secondary | ICD-10-CM

## 2012-11-04 DIAGNOSIS — K219 Gastro-esophageal reflux disease without esophagitis: Secondary | ICD-10-CM | POA: Insufficient documentation

## 2012-11-04 DIAGNOSIS — E1322 Other specified diabetes mellitus with diabetic chronic kidney disease: Secondary | ICD-10-CM

## 2012-11-04 DIAGNOSIS — I219 Acute myocardial infarction, unspecified: Secondary | ICD-10-CM

## 2012-11-04 DIAGNOSIS — I10 Essential (primary) hypertension: Secondary | ICD-10-CM

## 2012-11-04 DIAGNOSIS — R609 Edema, unspecified: Secondary | ICD-10-CM

## 2012-11-04 DIAGNOSIS — R0789 Other chest pain: Secondary | ICD-10-CM

## 2012-11-04 DIAGNOSIS — E1329 Other specified diabetes mellitus with other diabetic kidney complication: Secondary | ICD-10-CM

## 2012-11-04 DIAGNOSIS — K30 Functional dyspepsia: Secondary | ICD-10-CM

## 2012-11-04 NOTE — Assessment & Plan Note (Signed)
We have suggested she continue on her proton pump inhibitor in the a.m., Pepcid twice a day for symptoms.

## 2012-11-04 NOTE — Assessment & Plan Note (Addendum)
Recent symptoms concerning for GERD, though I am also concerned about angina. Son reports there is no symptoms with exertion, only at rest. Not taking nitroglycerin. Pepcid seems to help symptoms. We have suggested she take Pepcid twice a day. If she does have worsening symptoms with exertion concerning for angina, we could increase her isosorbide and or ranexa dosing.

## 2012-11-04 NOTE — Patient Instructions (Addendum)
You are doing well. No medication changes were made.  Please take pepcid (or generic equivalent) up to twice a day for heart burn  Please call us if you have new issues that need to be addressed before your next appt.  Your physician wants you to follow-up in: 6 months.  You will receive a reminder letter in the mail two months in advance. If you don't receive a letter, please call our office to schedule the follow-up appointment.

## 2012-11-04 NOTE — Progress Notes (Signed)
Patient ID: Sharon [name], female    DOB: 02/26/1916, 79 y.o.   MRN: [id number]  HPI Comments: Sharon [name] is a very pleasant 79 yo woman with history of coronary artery disease, turned down for bypass given renal dysfunction who presented to [facility] February 02 2012 with dizziness, lightheaded, bradycardia.  found to be in junctional rhythm with heart rates in the 20s to 30s, low blood pressure with systolics in the 80s, potassium 5.5, worsening renal dysfunction consistent with dehydration. Initially presenting on metoprolol XL 25 mg daily. Notes indicated a history of congestive heart failure, cardiac catheterization in [place] several years earlier found to have three-vessel disease.   beta blocker was held with improvement of her arrhythmia and heart rate. For tachycardia in followup, she was restarted on low-dose metoprolol tartrate twice a day. She is able to walk relatively quickly with a walker. No falls. She is currently living in assisted living at [facility]. Her biggest complaint today is reflux in the evening, sometimes in the daytime. Does not come on with exertion, typically at rest. She takes Pepcid on an occasional basis, omeprazole every morning.  With walking, she does have mild shortness of breath but recovers relatively quickly. Hemoglobin A1c 8.8, creatinine 2.0 with BUN 29 She is taking Lasix 40 mg daily. Metolazone was held.  previously on Lasix twice a day.  EKG shows normal sinus rhythm with rate 85 beats per minute, old anterior infarct, left anterior fascicular block  Echocardiogram done in the hospital showed normal LV function, mildly dilated left atrium, mild TR, right ventricular systolic pressure 30-40 mmHg     Outpatient Encounter Prescriptions as of 11/04/2012  Medication Sig Dispense Refill  . aspirin 81 MG tablet Take 81 mg by mouth daily.      . B-D ULTRAFINE III SHORT PEN 31G X 8 MM MISC daily.       . Calcium & Magnesium Carbonates (MYLANTA  PO) Take by mouth as needed.      . ergocalciferol (VITAMIN D2) 50000 UNITS capsule Take 50,000 Units by mouth every 30 (thirty) days.       . furosemide (LASIX) 40 MG tablet TAKE ONE TABLET BY MOUTH ONCE DAILY. (EDEMA/DIURETIC)  30 tablet  5  . HYDROcodone-homatropine (HYCODAN) 5-1.5 MG/5ML syrup Take 5 mLs by mouth every 8 (eight) hours as needed for cough.  120 mL  0  . IMDUR 60 MG 24 hr tablet TAKE 1 & 1/2 TABLETS BY MOUTH EVERY DAY IN THE MORNING FOR HEART.  45 tablet  3  . insulin glargine (LANTUS) 100 UNIT/ML injection Inject 30 Units into the skin at bedtime.       Marland Kitchen LIPITOR 20 MG tablet TAKE ONE TABLET BY MOUTH EACH DAY. (IMPROVES CHOLESTEROL)  30 tablet  3  . metoprolol tartrate (LOPRESSOR) 25 MG tablet TAKE 1/2 TABLET BY MOUTH TWICE A DAY. (HIGH BLOOD PRESSURE)  30 tablet  6  . nitroGLYCERIN (NITROSTAT) 0.4 MG SL tablet Place 1 tablet (0.4 mg total) under the tongue every 5 (five) minutes as needed for chest pain.  50 tablet  3  . omeprazole (PRILOSEC) 40 MG capsule TAKE 1 CAPSULE BY MOUTH ONCE DAILY IN THE MORNING FOR STOMACH. *DO NOT CRUSH*  30 capsule  6  . ondansetron (ZOFRAN) 4 MG tablet Take 4 mg by mouth every 8 (eight) hours as needed.      . pramipexole (MIRAPEX) 0.25 MG tablet TAKE ONE TABLET BY MOUTH 3 TIMES DAILY FOR PARKINSONS.  90 tablet  2  . ranolazine (RANEXA) 500 MG 12 hr tablet Take by mouth. Takes 2 tablets as needed for chest pain.      . traMADol (ULTRAM) 50 MG tablet Take 50 mg by mouth every 8 (eight) hours as needed.      . [DISCONTINUED] ranolazine (RANEXA) 500 MG 12 hr tablet Take 1 tablet (500 mg total) by mouth 2 (two) times daily.  60 tablet  2  . [DISCONTINUED] desoximetasone (TOPICORT) 0.25 % cream Apply topically 2 (two) times daily. As needed for eczema  60 g  3  . [DISCONTINUED] famotidine (PEPCID) 20 MG tablet Take 1 tablet (20 mg total) by mouth at bedtime.  30 tablet  4   No facility-administered encounter medications on file as of 11/04/2012.      Review of Systems  Constitutional: Negative.   HENT: Negative.   Eyes: Negative.   Respiratory: Negative.   Cardiovascular: Negative.   Gastrointestinal: Negative.   Musculoskeletal: Negative.   Skin: Negative.   Neurological: Negative.   Psychiatric/Behavioral: Negative.   All other systems reviewed and are negative.    BP 152/68  Pulse 85  Ht 5' (1.524 m)  Wt 185 lb 8 oz (84.142 kg)  BMI 36.23 kg/m2  SpO2 98%  Physical Exam  Nursing note and vitals reviewed. Constitutional: She is oriented to person, place, and time. She appears well-developed and well-nourished.  Elderly woman that walks relatively quickly with a 3 wheeled walker  HENT:  Head: Normocephalic.  Nose: Nose normal.  Mouth/Throat: Oropharynx is clear and moist.  Eyes: Conjunctivae are normal. Pupils are equal, round, and reactive to light.  Neck: Normal range of motion. Neck supple. No JVD present.  Cardiovascular: Normal rate, regular rhythm, S1 normal, S2 normal, normal heart sounds and intact distal pulses.  Exam reveals no gallop and no friction rub.   No murmur heard. Pulmonary/Chest: Effort normal and breath sounds normal. No respiratory distress. She has no wheezes. She has no rales. She exhibits no tenderness.  Abdominal: Soft. Bowel sounds are normal. She exhibits no distension. There is no tenderness.  Musculoskeletal: Normal range of motion. She exhibits no edema and no tenderness.  Lymphadenopathy:    She has no cervical adenopathy.  Neurological: She is alert and oriented to person, place, and time. Coordination normal.  Skin: Skin is warm and dry. No rash noted. No erythema.  Psychiatric: She has a normal mood and affect. Her behavior is normal. Judgment and thought content normal.    Assessment and Plan

## 2012-11-04 NOTE — Assessment & Plan Note (Addendum)
Blood pressure improved on recheck. No changes made to the medications.

## 2012-11-04 NOTE — Assessment & Plan Note (Signed)
No significant edema on today's visit. We have suggested she continue on her current diuretic regimen.

## 2012-11-04 NOTE — Assessment & Plan Note (Signed)
Creatinine and BUN have been relatively stable. I tried to reassure the patient.

## 2012-11-04 NOTE — Assessment & Plan Note (Signed)
Insulin was recently increased. She reports her sugars have been much better controlled

## 2012-11-04 NOTE — Assessment & Plan Note (Signed)
We have recommended that she stay on her generic Lipitor 

## 2012-11-10 ENCOUNTER — Other Ambulatory Visit: Payer: Self-pay | Admitting: *Deleted

## 2012-11-10 MED ORDER — RANOLAZINE ER 500 MG PO TB12: 500.0000 mg | ORAL_TABLET | Freq: Two times a day (BID) | ORAL | Status: DC | PRN

## 2012-11-21 ENCOUNTER — Other Ambulatory Visit: Payer: Self-pay | Admitting: Internal Medicine

## 2012-11-21 LAB — HM DIABETES EYE EXAM

## 2012-11-22 NOTE — Telephone Encounter (Signed)
Received refill request from pharmacy. Pepcid is not on medication list, and it looks like it was changed to Omeprazole in March.. Please advise when patient needs lab work regarding cholesterol medication?

## 2012-12-07 ENCOUNTER — Telehealth: Payer: Self-pay | Admitting: *Deleted

## 2012-12-07 NOTE — Telephone Encounter (Signed)
Need to know if patient can have cortisone injection from Dr. Rosita Kea since patient is diabetic

## 2012-12-07 NOTE — Telephone Encounter (Signed)
Notified Misty at Dr. Rosita Kea office [name] cortisone injection with the understanding [facility] to monitor patient CBG so insulin can be adjusted as needed.

## 2012-12-07 NOTE — Telephone Encounter (Signed)
OK to give injection of steroid. They can monitor BG at [facility] and we can adjust insulin if needed.

## 2012-12-08 ENCOUNTER — Telehealth: Payer: Self-pay | Admitting: *Deleted

## 2012-12-08 MED ORDER — ERGOCALCIFEROL 1.25 MG (50000 UT) PO CAPS: 50000.0000 [IU] | ORAL_CAPSULE | ORAL | Status: DC

## 2012-12-08 NOTE — Telephone Encounter (Signed)
Refill for monthly vitamin D . Call into [facility] [phone]

## 2012-12-21 ENCOUNTER — Encounter: Payer: Self-pay | Admitting: Internal Medicine

## 2012-12-21 ENCOUNTER — Ambulatory Visit (INDEPENDENT_AMBULATORY_CARE_PROVIDER_SITE_OTHER): Payer: MEDICARE | Admitting: Internal Medicine

## 2012-12-21 VITALS — BP 150/60 | HR 87 | Temp 98.6°F | Resp 16 | Wt 190.0 lb

## 2012-12-21 DIAGNOSIS — N184 Chronic kidney disease, stage 4 (severe): Secondary | ICD-10-CM

## 2012-12-21 DIAGNOSIS — E118 Type 2 diabetes mellitus with unspecified complications: Secondary | ICD-10-CM

## 2012-12-21 DIAGNOSIS — I10 Essential (primary) hypertension: Secondary | ICD-10-CM

## 2012-12-21 DIAGNOSIS — E1165 Type 2 diabetes mellitus with hyperglycemia: Secondary | ICD-10-CM

## 2012-12-21 DIAGNOSIS — E1322 Other specified diabetes mellitus with diabetic chronic kidney disease: Secondary | ICD-10-CM

## 2012-12-21 DIAGNOSIS — E1365 Other specified diabetes mellitus with hyperglycemia: Secondary | ICD-10-CM

## 2012-12-21 LAB — COMPREHENSIVE METABOLIC PANEL
ALT: 16 U/L (ref 0–35)
Alkaline Phosphatase: 139 U/L — ABNORMAL HIGH (ref 39–117)
CO2: 28 mEq/L (ref 19–32)
Creatinine, Ser: 2.2 mg/dL — ABNORMAL HIGH (ref 0.4–1.2)
GFR: 22.4 mL/min — ABNORMAL LOW (ref >60.00)
Sodium: 137 mEq/L (ref 135–145)
Total Bilirubin: 0.3 mg/dL (ref 0.3–1.2)
Total Protein: 5.6 g/dL — ABNORMAL LOW (ref 6.0–8.3)

## 2012-12-21 LAB — HEMOGLOBIN A1C: Hgb A1c MFr Bld: 9.2 % — ABNORMAL HIGH (ref 4.6–6.5)

## 2012-12-21 NOTE — Patient Instructions (Addendum)
Your food choices are in part determining your diabetes control.    Consider alternating between scrambled eggs,  Peanut butter on low carb toast, and  Oatmeal  daily  for breakfast. Exercise also lowers blood sugars.  Continue walking in the water.      Here is an example of a diabetic diet .  All of the foods can be found at grocery stores and in bulk at [employer].  The Atkins protein bars and shakes are available in more varieties at Target, WalMart and Lowe's Foods.     7 AM Breakfast:  Choose from the following as altenratives to oatmeal:  Low carbohydrate Protein  Shakes (I recommend the EAS AdvantEdge "Carb Control" shakes  Or the low carb shakes by Atkins.    2.5 carbs   Arnold's "Sandwhich Thin"toasted  w/ peanut butter (no jelly: about 20 net carbs  "Bagel Thin" with cream cheese and salmon: about 20 carbs   a scrambled egg/bacon/cheese burrito made with Mission's "carb balance" whole wheat tortilla  (about 10 net carbs )   Avoid cereal and bananas, oatmeal and cream of wheat and grits. They are loaded with carbohydrates!   10 AM: peanut butter and apples  Dannon Light n Fit [place] Yogurt  (80 cal, 8 carbs)    Lunch:   A Sandwich using the bread choices listed, Can use any  Eggs,  lunchmeat, grilled meat or canned tuna), avocado, regular mayo/mustard  and cheese.  A Salad using blue cheese, ranch,  Goddess or vinagrette,  No croutons or "confetti" and no "candied nuts" but regular nuts OK.   No pretzels or chips.  Pickles and miniature sweet peppers are a good low carb alternative that provide a "crunch"  The bread is the only source of carbohydrate in a sandwich and  can be decreased by trying some of these alternatives to traditional loaf bread  Joseph's makes a pita bread and a flat bread that are 50 cal and 4 net carbs available at BJs and WalMart.  This can be toasted to use with hummous as well  Toufayan makes a low carb flatbread that's 100 cal and 9 net carbs available at  [employer] and [employer] makes 2 sizes of  Low carb whole wheat tortilla  (The large one is 210 cal and 6 net carbs) Avoid "Low fat dressings, as well as Reyne Dumas and [address] dressings They are loaded with sugar!   3 PM/ Mid day  Snack:  Consider  1 ounce of  almonds, walnuts, pistachios, pecans, peanuts,  Macadamia nuts or a nut medley.  Avoid "granola"; the dried cranberries and raisins are loaded with carbohydrates. Mixed nuts as long as there are no raisins,  cranberries or dried fruit.     6 PM  Dinner:     Meat/fowl/fish with a green salad, and either broccoli, cauliflower, green beans, spinach, brussel sprouts or  Lima beans.    There is a low carb pasta by Dreamfield's that is acceptable and tastes great: only 5 digestible carbs/serving.( All grocery stores but BJs carry it )  Try Kai Levins Angelo's chicken piccata or chicken or eggplant parm over low carb pasta.(Lowes and BJs)   Clifton Custard Sanchez's "Carnitas" (pulled pork, no sauce,  0 carbs) or his beef pot roast to make a dinner burrito (at BJ's)  Pesto over low carb pasta (bj's sells a good quality pesto in the center refrigerated section of the deli   Whole wheat pasta is still  full of digestible carbs and  Not as low in glycemic index as Dreamfield's.   Brown rice is still rice,  So skip the rice and noodles if you eat [place] or [place] (or at least limit to 1/2 cup)  9 PM snack :   Breyer's "low carb" fudgsicle or  ice cream bar (Carb Smart line), or  Weight Watcher's ice cream bar , or another "no sugar added" ice cream;  a serving of fresh berries/cherries with whipped cream   Cheese or DANNON'S LlGHT N FIT [place] YOGURT  Avoid bananas, pineapple, grapes  and watermelon on a regular basis because they are high in sugar.  THINK OF THEM AS DESSERT  Remember that snack Substitutions should be less than 10 NET carbs per serving and meals < 20 carbs. Remember to subtract fiber grams to get the "net carbs."

## 2012-12-21 NOTE — Progress Notes (Signed)
Patient ID: Sharon [name], female   DOB: 02/16/1916, 79 y.o.   MRN: [id number]  Patient Active Problem List   Diagnosis Date Noted  . Hyperlipidemia 11/04/2012  . GERD (gastroesophageal reflux disease) 11/04/2012  . Right knee DJD 06/23/2012  . Arrhythmia 02/22/2012  . Uncontrolled secondary diabetes mellitus with stage 4 CKD (GFR 15-29) 12/02/2011  . Chronic kidney disease (CKD), stage IV (severe) 12/02/2011  . Edema of lower extremity 10/26/2011  . Coronary artery disease, occlusive 09/01/2011  . Rotator cuff arthropathy 09/01/2011  . Hypertension 09/01/2011  . Restless leg syndrome 09/01/2011    Subjective:  CC:   Chief Complaint  Patient presents with  . Follow-up    3 month    HPI: .    Sharon Girdner Christmasis a 79 y.o. female who presents for 3 month follow up on DM, CKD, and ischemic cardiomyopathy and obesity.  She has gained 5 lbs over the past month,Fasting blood sugars have averaged 130 or less but had a steroid shot in the knee which is currently elevating her sugars.   Nephrology has seen her and told her that her GFR had improved from 17 to 25  Ml/min so there are no immediate plans for hemodialysis .  Dr.  Mariah Milling did not change her medications at last visit.  She is using the swimming pool to walk and exercise only twice per week and is generally sedentary otherwise due to chronic knee pain from DJD.  No chest pain, orthopnea or hypglycemic events  Past Medical History  Diagnosis Date  . Diabetes mellitus, type 2   . Diabetic nephropathy   . GERD (gastroesophageal reflux disease)   . Essential hypertension, benign   . Coronary atherosclerosis of native coronary artery   . Venous stasis   . Osteoarthritis     with chronic back pain  . Osteopenia   . Urge incontinence     mild  . History of CVA (cerebrovascular accident) 75  . Vitamin D deficiency   . Hyperlipidemia   . Vertigo   . Restless leg syndrome   . Anemia   . Hiatal hernia     Past Surgical  History  Procedure Laterality Date  . Bladder repair  1988  . Total abdominal hysterectomy  1980  . Neck surgery  1991  . Lumbar laminectomy    . Knee arthroscopy    . Spine surgery      lumbar laminectomy       The following portions of the patient's history were reviewed and updated as appropriate: Allergies, current medications, and problem list.    Review of Systems:   Patient denies headache, fevers, malaise, unintentional weight loss, skin rash, eye pain, sinus congestion and sinus pain, sore throat, dysphagia,  hemoptysis , cough, dyspnea, wheezing, chest pain, palpitations, orthopnea, edema, abdominal pain, nausea, melena, diarrhea, constipation, flank pain, dysuria, hematuria, urinary  Frequency, nocturia, numbness, tingling, seizures,  Focal weakness, Loss of consciousness,  Tremor, insomnia, depression, anxiety, and suicidal ideation.     History   Social History  . Marital Status: Widowed    Spouse Name: N/A    Number of Children: N/A  . Years of Education: N/A   Occupational History  . Not on file.   Social History Main Topics  . Smoking status: Never Smoker   . Smokeless tobacco: Never Used  . Alcohol Use: No  . Drug Use: No  . Sexually Active: Not on file   Other Topics Concern  . Not  on file   Social History Narrative  . No narrative on file    Objective:  BP 150/60  Pulse 87  Temp(Src) 98.6 F (37 C) (Oral)  Resp 16  Wt 190 lb (86.183 kg)  BMI 37.11 kg/m2  SpO2 96%  General appearance: alert, cooperative and appears stated age Ears: normal TM's and external ear canals both ears Throat: lips, mucosa, and tongue normal; teeth and gums normal Neck: no adenopathy, no carotid bruit, supple, symmetrical, trachea midline and thyroid not enlarged, symmetric, no tenderness/mass/nodules Back: symmetric, no curvature. ROM normal. No CVA tenderness. Lungs: clear to auscultation bilaterally Heart: regular rate and rhythm, S1, S2 normal, no murmur,  click, rub or gallop Abdomen: soft, non-tender; bowel sounds normal; no masses,  no organomegaly Pulses: 2+ and symmetric Skin: Skin color, texture, turgor normal. No rashes or lesions Lymph nodes: Cervical, supraclavicular, and axillary nodes normal.  Assessment and Plan:  Uncontrolled secondary diabetes mellitus with stage 4 CKD (GFR 15-29) A1c has been increasing over the past year and is now 9.2.  alteration of diet will be difficult given her use of the dining services at [facility] and her severe CKD making a higher protein diet problematic.,  Wil lhave her increase the Lantus by 5 units and make biweekly adjustments.   Hypertension Well controlled on current regimen. Renal function stable, no changes today.  Chronic kidney disease (CKD), stage IV (severe) Stable, with normal electrolytes today.  continue current medications   Updated Medication List Outpatient Encounter Prescriptions as of 12/21/2012  Medication Sig Dispense Refill  . aspirin 81 MG tablet Take 81 mg by mouth daily.      Marland Kitchen atorvastatin (LIPITOR) 20 MG tablet TAKE ONE TABLET BY MOUTH EACH DAY. (IMPROVES CHOLESTEROL)  30 tablet  5  . B-D ULTRAFINE III SHORT PEN 31G X 8 MM MISC daily.       . Calcium & Magnesium Carbonates (MYLANTA PO) Take by mouth as needed.      . ergocalciferol (VITAMIN D2) 50000 UNITS capsule Take 1 capsule (50,000 Units total) by mouth every 30 (thirty) days.  4 capsule  3  . famotidine (PEPCID) 20 MG tablet TAKE ONE TABLET BY MOUTH TWICE A DAY AS NEEDED FOR GER  60 tablet  5  . furosemide (LASIX) 40 MG tablet TAKE ONE TABLET BY MOUTH ONCE DAILY. (EDEMA/DIURETIC)  30 tablet  5  . insulin glargine (LANTUS) 100 UNIT/ML injection Inject 0.25 mLs (25 Units total) into the skin at bedtime.  10 mL    . isosorbide mononitrate (IMDUR) 60 MG 24 hr tablet TAKE 1 & 1/2 TABLETS BY MOUTH EVERY DAY IN THE MORNING FOR HEART.  45 tablet  5  . metoprolol tartrate (LOPRESSOR) 25 MG tablet TAKE 1/2 TABLET BY MOUTH  TWICE A DAY. (HIGH BLOOD PRESSURE)  30 tablet  6  . pramipexole (MIRAPEX) 0.25 MG tablet TAKE ONE TABLET BY MOUTH 3 TIMES DAILY FOR PARKINSONS.  90 tablet  2  . traMADol (ULTRAM) 50 MG tablet Take 50 mg by mouth every 8 (eight) hours as needed.      . [DISCONTINUED] insulin glargine (LANTUS) 100 UNIT/ML injection Inject 20 Units into the skin at bedtime.       Marland Kitchen HYDROcodone-homatropine (HYCODAN) 5-1.5 MG/5ML syrup Take 5 mLs by mouth every 8 (eight) hours as needed for cough.  120 mL  0  . nitroGLYCERIN (NITROSTAT) 0.4 MG SL tablet Place 1 tablet (0.4 mg total) under the tongue every 5 (five) minutes as  needed for chest pain.  50 tablet  3  . omeprazole (PRILOSEC) 40 MG capsule TAKE 1 CAPSULE BY MOUTH ONCE DAILY IN THE MORNING FOR STOMACH. *DO NOT CRUSH*  30 capsule  6  . ondansetron (ZOFRAN) 4 MG tablet Take 4 mg by mouth every 8 (eight) hours as needed.      . ranolazine (RANEXA) 500 MG 12 hr tablet Take 1 tablet (500 mg total) by mouth 2 (two) times daily as needed. Takes 2 tablets as needed for chest pain.  30 tablet  0   No facility-administered encounter medications on file as of 12/21/2012.     Orders Placed This Encounter  Procedures  . Hemoglobin A1c  . Comprehensive metabolic panel  . HM DIABETES EYE EXAM  . HM DIABETES FOOT EXAM    Return in about 3 months (around 03/23/2013).

## 2012-12-22 ENCOUNTER — Encounter: Payer: Self-pay | Admitting: Internal Medicine

## 2012-12-22 ENCOUNTER — Telehealth: Payer: Self-pay

## 2012-12-22 ENCOUNTER — Emergency Department: Payer: Self-pay | Admitting: Emergency Medicine

## 2012-12-22 LAB — COMPREHENSIVE METABOLIC PANEL
Albumin: 3.1 g/dL — ABNORMAL LOW (ref 3.4–5.0)
Anion Gap: 7 (ref 7–16)
BUN: 49 mg/dL — ABNORMAL HIGH (ref 7–18)
Calcium, Total: 8.4 mg/dL — ABNORMAL LOW (ref 8.5–10.1)
Co2: 28 mmol/L (ref 21–32)
Creatinine: 2.37 mg/dL — ABNORMAL HIGH (ref 0.60–1.30)
EGFR (African American): 20 — ABNORMAL LOW
EGFR (Non-African Amer.): 17 — ABNORMAL LOW
Glucose: 315 mg/dL — ABNORMAL HIGH (ref 65–99)
Osmolality: 295 (ref 275–301)
Potassium: 4.5 mmol/L (ref 3.5–5.1)
SGOT(AST): 16 U/L (ref 15–37)
SGPT (ALT): 19 U/L (ref 12–78)

## 2012-12-22 LAB — CBC
HGB: 11.1 g/dL — ABNORMAL LOW (ref 12.0–16.0)
MCH: 30 pg (ref 26.0–34.0)
MCHC: 33.7 g/dL (ref 32.0–36.0)
RDW: 14.5 % (ref 11.5–14.5)
WBC: 11.5 10*3/uL — ABNORMAL HIGH (ref 3.6–11.0)

## 2012-12-22 LAB — PRO B NATRIURETIC PEPTIDE: B-Type Natriuretic Peptide: 3087 pg/mL — ABNORMAL HIGH (ref 0–450)

## 2012-12-22 LAB — CK TOTAL AND CKMB (NOT AT ARMC): CK-MB: 1.1 ng/mL (ref 0.5–3.6)

## 2012-12-22 MED ORDER — INSULIN GLARGINE 100 UNIT/ML ~~LOC~~ SOLN: 25.0000 [IU] | Freq: Every day | SUBCUTANEOUS | Status: AC

## 2012-12-22 NOTE — Assessment & Plan Note (Signed)
A1c has been increasing over the past year and is now 9.2.  alteration of diet will be difficult given her use of the dining services at [facility] and her severe CKD making a higher protein diet problematic.,  Wil lhave her increase the Lantus by 5 units and make biweekly adjustments.

## 2012-12-22 NOTE — Assessment & Plan Note (Signed)
Well controlled on current regimen. Renal function stable, no changes today. 

## 2012-12-22 NOTE — Telephone Encounter (Signed)
Wanted to let dr Mariah Milling know pt was in ED with chest pressure, SOB, was given Nitro at [facility], improved, wanted to let dr Mariah Milling know, will fax ekg from er

## 2012-12-22 NOTE — Assessment & Plan Note (Signed)
Stable, with normal electrolytes today.  continue current medications

## 2012-12-23 NOTE — Telephone Encounter (Signed)
Dr. Mariah Milling aware. He would like pt seen for ER f/u

## 2012-12-26 ENCOUNTER — Ambulatory Visit (INDEPENDENT_AMBULATORY_CARE_PROVIDER_SITE_OTHER): Payer: MEDICARE | Admitting: Cardiovascular Disease

## 2012-12-26 ENCOUNTER — Encounter: Payer: Self-pay | Admitting: Cardiovascular Disease

## 2012-12-26 VITALS — BP 153/66 | HR 67 | Ht 60.0 in | Wt 186.0 lb

## 2012-12-26 DIAGNOSIS — I2510 Atherosclerotic heart disease of native coronary artery without angina pectoris: Secondary | ICD-10-CM

## 2012-12-26 DIAGNOSIS — I219 Acute myocardial infarction, unspecified: Secondary | ICD-10-CM

## 2012-12-26 DIAGNOSIS — E1329 Other specified diabetes mellitus with other diabetic kidney complication: Secondary | ICD-10-CM

## 2012-12-26 DIAGNOSIS — N184 Chronic kidney disease, stage 4 (severe): Secondary | ICD-10-CM

## 2012-12-26 DIAGNOSIS — I499 Cardiac arrhythmia, unspecified: Secondary | ICD-10-CM

## 2012-12-26 DIAGNOSIS — E785 Hyperlipidemia, unspecified: Secondary | ICD-10-CM

## 2012-12-26 DIAGNOSIS — E1322 Other specified diabetes mellitus with diabetic chronic kidney disease: Secondary | ICD-10-CM

## 2012-12-26 MED ORDER — NITROGLYCERIN 0.4 MG SL SUBL: 0.4000 mg | SUBLINGUAL_TABLET | SUBLINGUAL | Status: AC | PRN

## 2012-12-26 NOTE — Assessment & Plan Note (Signed)
Underlying renal failure making cardiac workup more challenging. Not a good candidate for cardiac catheterization. Will monitor symptoms for now.

## 2012-12-26 NOTE — Assessment & Plan Note (Signed)
We have encouraged continued exercise, careful diet management in an effort to lose weight. 

## 2012-12-26 NOTE — Progress Notes (Signed)
Patient ID: Sharon [name], female    DOB: 1915-09-27, 79 y.o.   MRN: [id number]  HPI Comments: Sharon [name] is a very pleasant 79 yo woman with history of coronary artery disease, turned down for bypass given renal dysfunction who presented to [facility] February 02 2012 with dizziness, lightheaded, bradycardia.  found to be in junctional rhythm with heart rates in the 20s to 30s, low blood pressure with systolics in the 80s, potassium 5.5, worsening renal dysfunction consistent with dehydration. Initially presenting on metoprolol XL 25 mg daily. Notes indicated a history of congestive heart failure, cardiac catheterization in [place] several years earlier found to have three-vessel disease.   beta blocker was held with improvement of her arrhythmia and heart rate. For tachycardia in followup, she was restarted on low-dose metoprolol tartrate twice a day. She is able to walk relatively quickly with a walker. No falls. She is currently living in [facility].  With walking, she does have mild shortness of breath but recovers relatively quickly. Hemoglobin A1c 8.8, creatinine 2.0 with BUN 29 She is taking Lasix 40 mg daily. Metolazone was held.  previously on Lasix twice a day.  In discussion today with her son, she has had several episodes of chest pain relieved with nitroglycerin. He feels that these episodes could be from anxiety as she is rushing. Otherwise she is exercising in the portal able to walk without reproducible chest pain. One episode she went to the emergency room, cardiac enzymes were negative, other workup negative and she was sent home. BNP was 3000, creatinine 2.37, BUN 49 Repeat episode that night. She reports she has rare confusion  Notes from cardiologist in 2012 detailing cardiac cath in February 2012 with 40% left main disease, 70% proximal LAD disease, nonobstructed RCA disease Stent was not performed. Followup stress test in 2012  EKG shows normal  sinus rhythm with rate 67 beats per minute, old anterior infarct, left anterior fascicular block  Echocardiogram done in the hospital showed normal LV function, mildly dilated left atrium, mild TR, right ventricular systolic pressure 30-40 mmHg     Outpatient Encounter Prescriptions as of 12/26/2012  Medication Sig Dispense Refill  . aspirin 81 MG tablet Take 81 mg by mouth daily.      Marland Kitchen atorvastatin (LIPITOR) 20 MG tablet TAKE ONE TABLET BY MOUTH EACH DAY. (IMPROVES CHOLESTEROL)  30 tablet  5  . B-D ULTRAFINE III SHORT PEN 31G X 8 MM MISC daily.       . Calcium & Magnesium Carbonates (MYLANTA PO) Take by mouth as needed.      . ergocalciferol (VITAMIN D2) 50000 UNITS capsule Take 1 capsule (50,000 Units total) by mouth every 30 (thirty) days.  4 capsule  3  . famotidine (PEPCID) 20 MG tablet       . furosemide (LASIX) 40 MG tablet TAKE ONE TABLET BY MOUTH ONCE DAILY. (EDEMA/DIURETIC)  30 tablet  5  . HYDROcodone-homatropine (HYCODAN) 5-1.5 MG/5ML syrup Take 5 mLs by mouth every 8 (eight) hours as needed for cough.  120 mL  0  . insulin glargine (LANTUS) 100 UNIT/ML injection Inject 0.25 mLs (25 Units total) into the skin at bedtime.  10 mL    . isosorbide mononitrate (IMDUR) 60 MG 24 hr tablet TAKE 1 & 1/2 TABLETS BY MOUTH EVERY DAY IN THE MORNING FOR HEART.  45 tablet  5  . metoprolol tartrate (LOPRESSOR) 25 MG tablet TAKE 1/2 TABLET BY MOUTH TWICE A DAY. (HIGH  BLOOD PRESSURE)  30 tablet  6  . nitroGLYCERIN (NITROSTAT) 0.4 MG SL tablet Place 1 tablet (0.4 mg total) under the tongue every 5 (five) minutes as needed for chest pain.  50 tablet  3  . ondansetron (ZOFRAN) 4 MG tablet Take 4 mg by mouth every 8 (eight) hours as needed.      . pramipexole (MIRAPEX) 0.25 MG tablet TAKE ONE TABLET BY MOUTH 3 TIMES DAILY FOR PARKINSONS.  90 tablet  2  . ranolazine (RANEXA) 500 MG 12 hr tablet Take 1 tablet (500 mg total) by mouth 2 (two) times daily as needed. Takes 2 tablets as needed for chest pain.   30 tablet  0  . traMADol (ULTRAM) 50 MG tablet Take 50 mg by mouth every 8 (eight) hours as needed.      . [DISCONTINUED] famotidine (PEPCID) 20 MG tablet TAKE ONE TABLET BY MOUTH TWICE A DAY AS NEEDED FOR GER  60 tablet  5  . [DISCONTINUED] omeprazole (PRILOSEC) 40 MG capsule TAKE 1 CAPSULE BY MOUTH ONCE DAILY IN THE MORNING FOR STOMACH. *DO NOT CRUSH*  30 capsule  6   No facility-administered encounter medications on file as of 12/26/2012.     Review of Systems  Constitutional: Negative.   HENT: Negative.   Eyes: Negative.   Respiratory: Positive for chest tightness.   Cardiovascular: Positive for chest pain.  Gastrointestinal: Negative.   Musculoskeletal: Negative.   Skin: Negative.   Neurological: Negative.   Psychiatric/Behavioral: Negative.   All other systems reviewed and are negative.    BP 153/66  Pulse 67  Ht 5' (1.524 m)  Wt 186 lb (84.369 kg)  BMI 36.33 kg/m2  Physical Exam  Nursing note and vitals reviewed. Constitutional: She is oriented to person, place, and time. She appears well-developed and well-nourished.  Elderly woman that walks relatively quickly with a 3 wheeled walker  HENT:  Head: Normocephalic.  Nose: Nose normal.  Mouth/Throat: Oropharynx is clear and moist.  Eyes: Conjunctivae are normal. Pupils are equal, round, and reactive to light.  Neck: Normal range of motion. Neck supple. No JVD present.  Cardiovascular: Normal rate, regular rhythm, S1 normal, S2 normal, normal heart sounds and intact distal pulses.  Exam reveals no gallop and no friction rub.   No murmur heard. Pulmonary/Chest: Effort normal and breath sounds normal. No respiratory distress. She has no wheezes. She has no rales. She exhibits no tenderness.  Abdominal: Soft. Bowel sounds are normal. She exhibits no distension. There is no tenderness.  Musculoskeletal: Normal range of motion. She exhibits no edema and no tenderness.  Lymphadenopathy:    She has no cervical adenopathy.   Neurological: She is alert and oriented to person, place, and time. Coordination normal.  Skin: Skin is warm and dry. No rash noted. No erythema.  Psychiatric: She has a normal mood and affect. Her behavior is normal. Judgment and thought content normal.    Assessment and Plan

## 2012-12-26 NOTE — Assessment & Plan Note (Addendum)
Notes from prior cardiologist indicating 70% proximal LAD disease. Recent episodes of chest pain in the setting of anxiety, otherwise exercises well and walks fast without any reproducible angina. We have suggested to the son and the patient that we continue nitroglycerin for symptoms. If frequency and intensity of attacks increases, we'll try to obtain cardiac cath films from [place] to see if LAD lesion is amenable to PCI.   I've asked the son to monitor her blood pressure. If there is room, we could potentially increase the isosorbide dosing

## 2012-12-26 NOTE — Patient Instructions (Addendum)
You are doing well. No medication changes were made.  Continue Nitro SL for chest pain episodes Monitor the blood pressure at [facility] for 2 weeks  Please call us if you have new issues that need to be addressed before your next appt.  Your physician wants you to follow-up in: 2 months.

## 2012-12-26 NOTE — Assessment & Plan Note (Signed)
Continue generic Lipitor

## 2013-01-10 ENCOUNTER — Other Ambulatory Visit: Payer: Self-pay | Admitting: *Deleted

## 2013-01-10 MED ORDER — RANOLAZINE ER 500 MG PO TB12: 500.0000 mg | ORAL_TABLET | Freq: Two times a day (BID) | ORAL | Status: DC | PRN

## 2013-01-25 ENCOUNTER — Telehealth: Payer: Self-pay

## 2013-01-25 NOTE — Telephone Encounter (Signed)
lmtcb re:bp readings from [facility]

## 2013-01-25 NOTE — Telephone Encounter (Signed)
lmtcb

## 2013-01-26 ENCOUNTER — Other Ambulatory Visit: Payer: Self-pay | Admitting: Internal Medicine

## 2013-01-30 ENCOUNTER — Telehealth: Payer: Self-pay

## 2013-01-30 NOTE — Telephone Encounter (Signed)
Pt nurse w [facility] states pt is continuing to have frequent episodes of angina, 3 in the last 2 weeks, has been given Nitroglycerin, episodes brought by exertion activities, walking to the dining area, getting dressed, please advise

## 2013-01-31 NOTE — Telephone Encounter (Signed)
lmtcb

## 2013-01-31 NOTE — Telephone Encounter (Signed)
Sharon [name] just wanted to make [place] aware of the fact that pt continues to have angina episodes since last OV Says she has about 1 per week and is relived with NTG SL x1 It is associated with exertion and flushed feeling I told her Dr. Mariah Milling is aware of these episodes (based on last Office note) but I will make him aware they are still occuring  In mean time, I have asked for BP log.  She says this is not part of pt's care plan but she will make this part of it and will call us with readings

## 2013-02-09 ENCOUNTER — Telehealth: Payer: Self-pay | Admitting: *Deleted

## 2013-02-09 NOTE — Telephone Encounter (Signed)
Tammy called from [facility] and needs a written order faxed over for patient supplies for BID testing. Fax [phone]

## 2013-02-10 NOTE — Telephone Encounter (Signed)
Not sure what you mean.  A letter to test her blood sugars twice daily? Usually supplies for DM are in the form letter.

## 2013-02-10 NOTE — Telephone Encounter (Signed)
Left message for Tammy to call back and verify orders needed.

## 2013-02-13 NOTE — Telephone Encounter (Signed)
Order faxed as requested by facility.

## 2013-02-13 NOTE — Telephone Encounter (Signed)
Need order to state patient needs CBg's checked BID per DX. TO sent to pharmacy so they will provide patient with proper supplies.

## 2013-02-14 ENCOUNTER — Telehealth: Payer: Self-pay | Admitting: Internal Medicine

## 2013-02-14 MED ORDER — RANOLAZINE ER 500 MG PO TB12: 500.0000 mg | ORAL_TABLET | Freq: Two times a day (BID) | ORAL | Status: AC

## 2013-02-14 NOTE — Telephone Encounter (Signed)
Is patient to take Ranexa when not having chest pain sig reads 2 times daily as needed and may take two if having chest pain facility is unsure.

## 2013-02-14 NOTE — Telephone Encounter (Signed)
[phone], will fax to att. Angelica Chessman

## 2013-02-14 NOTE — Telephone Encounter (Signed)
Faxed

## 2013-02-14 NOTE — Telephone Encounter (Signed)
The ranexa shoule read one tablet twice daily.  Take an additional tablet twice daily as needed for chest pain.  I have revised it and printed it.

## 2013-02-14 NOTE — Telephone Encounter (Signed)
Per Angelica Chessman at [facility], Ranexa has been given to pt 1 pill twice a day every morning and every evening.  Is that correct or is this to be an "s needed" medicine for chest pain.  Please call Angelica Chessman on her cell asap so they know what to give her this morning:  [phone]

## 2013-02-17 ENCOUNTER — Telehealth: Payer: Self-pay | Admitting: *Deleted

## 2013-02-17 NOTE — Telephone Encounter (Signed)
We received fax and it is in Dr. Windell Hummingbird folder to be reviewed and recommendations to be given. Will forward to Dr. Mariah Milling

## 2013-02-17 NOTE — Telephone Encounter (Signed)
Mandy, patient's home health RN is calling back about blood pressure numbers sent over on Monday and would like a call back at [phone]

## 2013-02-17 NOTE — Telephone Encounter (Signed)
Spoke with Angelica Chessman, home health nurse. She is calling to check to be sure Dr Mariah Milling had received  fax record of pt's BP readings she faxed to Dr Mariah Milling earlier in the week since she has not heard a response back from Dr Mariah Milling. She is going to refax the recording of BP readings to Dr Mariah Milling.  She said once Dr Mariah Milling had reviewed the readings his response could be faxed back to [phone]. I will forward back to Quita Skye and Dr Mariah Milling.

## 2013-02-20 NOTE — Telephone Encounter (Signed)
Blood pressures look good. Would continue current medications and continue to track blood pressure measurements as you are doing

## 2013-02-21 NOTE — Telephone Encounter (Signed)
Form from [facility] was faxed back with Dr. Windell Hummingbird recommendations.

## 2013-02-22 ENCOUNTER — Telehealth: Payer: Self-pay | Admitting: *Deleted

## 2013-02-22 NOTE — Telephone Encounter (Signed)
Resident having increased symptoms of gerd and facility would like to switch patient back to omeprazole from pepcid please advise if so need script and order patient previously on 40 mg omeprazole.

## 2013-02-23 MED ORDER — OMEPRAZOLE 40 MG PO CPDR: 40.0000 mg | DELAYED_RELEASE_CAPSULE | Freq: Every day | ORAL | Status: DC

## 2013-02-23 NOTE — Telephone Encounter (Signed)
Signed.

## 2013-02-23 NOTE — Telephone Encounter (Signed)
Faxed to [place] at facility order and scripts.

## 2013-03-08 ENCOUNTER — Other Ambulatory Visit: Payer: Self-pay | Admitting: Internal Medicine

## 2013-03-20 ENCOUNTER — Other Ambulatory Visit: Payer: Self-pay | Admitting: *Deleted

## 2013-03-21 MED ORDER — PRAMIPEXOLE DIHYDROCHLORIDE 0.25 MG PO TABS: ORAL_TABLET | ORAL | Status: DC

## 2013-03-23 ENCOUNTER — Encounter: Payer: Self-pay | Admitting: Internal Medicine

## 2013-03-23 ENCOUNTER — Ambulatory Visit (INDEPENDENT_AMBULATORY_CARE_PROVIDER_SITE_OTHER): Payer: MEDICARE | Admitting: Internal Medicine

## 2013-03-23 VITALS — BP 138/60 | HR 82 | Temp 98.0°F | Resp 16 | Wt 192.5 lb

## 2013-03-23 DIAGNOSIS — E1322 Other specified diabetes mellitus with diabetic chronic kidney disease: Secondary | ICD-10-CM

## 2013-03-23 DIAGNOSIS — I2510 Atherosclerotic heart disease of native coronary artery without angina pectoris: Secondary | ICD-10-CM

## 2013-03-23 DIAGNOSIS — I219 Acute myocardial infarction, unspecified: Secondary | ICD-10-CM

## 2013-03-23 DIAGNOSIS — E1329 Other specified diabetes mellitus with other diabetic kidney complication: Secondary | ICD-10-CM

## 2013-03-23 DIAGNOSIS — N184 Chronic kidney disease, stage 4 (severe): Secondary | ICD-10-CM

## 2013-03-23 DIAGNOSIS — E119 Type 2 diabetes mellitus without complications: Secondary | ICD-10-CM

## 2013-03-23 LAB — HM DIABETES FOOT EXAM: HM Diabetic Foot Exam: NORMAL

## 2013-03-23 NOTE — Progress Notes (Signed)
Patient ID: Sharon [name], female   DOB: Dec 19, 1915, 79 y.o.   MRN: [id number]  Patient Active Problem List   Diagnosis Date Noted  . Hyperlipidemia 11/04/2012  . GERD (gastroesophageal reflux disease) 11/04/2012  . Right knee DJD 06/23/2012  . Arrhythmia 02/22/2012  . Uncontrolled secondary diabetes mellitus with stage 4 CKD (GFR 15-29) 12/02/2011  . Chronic kidney disease (CKD), stage IV (severe) 12/02/2011  . Edema of lower extremity 10/26/2011  . Coronary artery disease, occlusive 09/01/2011  . Rotator cuff arthropathy 09/01/2011  . Hypertension 09/01/2011  . Restless leg syndrome 09/01/2011    Subjective:  CC:   Chief Complaint  Patient presents with  . Follow-up    3 month follow up    HPI:   Sharon Scaffidi Christmasis a 79 y.o. female who presents Follow up on chronic conditions including diabetes mellitus type 2 insulin requiring, chronic daily disease, cardiomyopathy, DJD bilateral knees, and obesity. She's accompanied by her son. She was recently Sent for ultrasound of left leg To [facility] by Dr. Al Corpus, podiatry for tingling reported by patient with accompanying warmth of the  Leg.  DVT was ruled out. She states that her left leg pain feels tingly and numb at times. It happens during the day and at night. The right leg is not affected. It is not keeping her up at night.  She has also been started on medication for restless legs in the interim. Her blood sugars are checked once or twice daily. She has had no hypoglycemic events. She has been indulging in desert along with watermelon regularly.   Past Medical History  Diagnosis Date  . Diabetes mellitus, type 2   . Diabetic nephropathy   . GERD (gastroesophageal reflux disease)   . Essential hypertension, benign   . Coronary atherosclerosis of native coronary artery   . Venous stasis   . Osteoarthritis     with chronic back pain  . Osteopenia   . Urge incontinence     mild  . History of CVA (cerebrovascular accident) 8   . Vitamin D deficiency   . Hyperlipidemia   . Vertigo   . Restless leg syndrome   . Anemia   . Hiatal hernia     Past Surgical History  Procedure Laterality Date  . Bladder repair  1988  . Total abdominal hysterectomy  1980  . Neck surgery  1991  . Lumbar laminectomy    . Knee arthroscopy    . Spine surgery      lumbar laminectomy       The following portions of the patient's history were reviewed and updated as appropriate: Allergies, current medications, and problem list.    Review of Systems:   12 Pt  review of systems was negative except those addressed in the HPI,     History   Social History  . Marital Status: Widowed    Spouse Name: N/A    Number of Children: N/A  . Years of Education: N/A   Occupational History  . Not on file.   Social History Main Topics  . Smoking status: Never Smoker   . Smokeless tobacco: Never Used  . Alcohol Use: No  . Drug Use: No  . Sexual Activity: Not on file   Other Topics Concern  . Not on file   Social History Narrative  . No narrative on file    Objective:  Filed Vitals:   03/23/13 1041  BP: 138/60  Pulse: 82  Temp: 98 F (36.7  C)  Resp: 16     General appearance: alert, cooperative and appears stated age Ears: normal TM's and external ear canals both ears Throat: lips, mucosa, and tongue normal; teeth and gums normal Neck: no adenopathy, no carotid bruit, supple, symmetrical, trachea midline and thyroid not enlarged, symmetric, no tenderness/mass/nodules Back: symmetric, no curvature. ROM normal. No CVA tenderness. Lungs: clear to auscultation bilaterally Heart: regular rate and rhythm, S1, S2 normal, no murmur, click, rub or gallop Abdomen: soft, non-tender; bowel sounds normal; no masses,  no organomegaly Pulses: 2+ and symmetric Skin: Skin color, texture, turgor normal. No rashes or lesions Lymph nodes: Cervical, supraclavicular, and axillary nodes normal. Foot exam:  Nails are well trimmed,   No callouses,  Sensation intact to microfilament   Assessment and Plan:  Chronic kidney disease (CKD), stage IV (severe) She has been following up with Dr. Luther Redo who states that her kidney function has improved and is stable. There is no longer the threat of hemodialysis. We have all agreed that her hemodialysis would not be in her best interest anyway.  Coronary artery disease, occlusive She has occasional anginal pain with increased anxiety and movement which is relieved with one sublingual nitroglycerin.  Uncontrolled secondary diabetes mellitus with stage 4 CKD (GFR 15-29) She has had mild improvement in control over the last 3 months. A1c is now below 9 but still high at 8.7. We discussed her new neuropathic  symptom today involving her left foot.   tenderness asymmetry I suspect it may be coming from spinal stenosis as opposed to diabetic neuropathy. Her sensation is intact to light touch.  Reviewed diet today and I recommended that she avoid milk watermelon and diet pudding on the same day since these are both high glycemic index. At her age however both she and I are not  inclined to pursue more aggressive control due to the diminished quality of life she will experience as well as the increased risk for hypoglycemic events. Continue aspirin and statin.    Updated Medication List Outpatient Encounter Prescriptions as of 03/23/2013  Medication Sig Dispense Refill  . aspirin 81 MG tablet Take 81 mg by mouth daily.      Marland Kitchen atorvastatin (LIPITOR) 20 MG tablet TAKE ONE TABLET BY MOUTH EACH DAY. (IMPROVES CHOLESTEROL)  30 tablet  5  . B-D ULTRAFINE III SHORT PEN 31G X 8 MM MISC daily.       . Calcium & Magnesium Carbonates (MYLANTA PO) Take by mouth as needed.      . ergocalciferol (VITAMIN D2) 50000 UNITS capsule Take 1 capsule (50,000 Units total) by mouth every 30 (thirty) days.  4 capsule  3  . famotidine (PEPCID) 20 MG tablet       . furosemide (LASIX) 40 MG tablet TAKE ONE TABLET BY  MOUTH ONCE DAILY. (EDEMA/DIURETIC)  30 tablet  5  . insulin glargine (LANTUS) 100 UNIT/ML injection Inject 0.25 mLs (25 Units total) into the skin at bedtime.  10 mL    . isosorbide mononitrate (IMDUR) 60 MG 24 hr tablet TAKE 1 & 1/2 TABLETS BY MOUTH EVERY DAY IN THE MORNING FOR HEART.  45 tablet  5  . metoprolol tartrate (LOPRESSOR) 25 MG tablet TAKE 1/2 TABLET BY MOUTH TWICE A DAY. (HIGH BLOOD PRESSURE)  30 tablet  6  . nitroGLYCERIN (NITROSTAT) 0.4 MG SL tablet Place 1 tablet (0.4 mg total) under the tongue every 5 (five) minutes as needed for chest pain.  25 tablet  6  . ondansetron (  ZOFRAN) 4 MG tablet Take 4 mg by mouth every 8 (eight) hours as needed.      . ranolazine (RANEXA) 500 MG 12 hr tablet Take 1 tablet (500 mg total) by mouth 2 (two) times daily. Take and additional dose twice daily as needed for chest pain  90 tablet  3  . traMADol (ULTRAM) 50 MG tablet Take 50 mg by mouth every 8 (eight) hours as needed.      Marland Kitchen HYDROcodone-homatropine (HYCODAN) 5-1.5 MG/5ML syrup Take 5 mLs by mouth every 8 (eight) hours as needed for cough.  120 mL  0  . omeprazole (PRILOSEC) 40 MG capsule Take 1 capsule (40 mg total) by mouth daily.  90 capsule  3  . pramipexole (MIRAPEX) 0.25 MG tablet TAKE ONE TABLET BY MOUTH 3 TIMES DAILY FOR PARKINSONS.  90 tablet  2   No facility-administered encounter medications on file as of 03/23/2013.     Orders Placed This Encounter  Procedures  . Hemoglobin A1c  . Comprehensive metabolic panel  . LDL cholesterol, direct  . HM DIABETES FOOT EXAM    No Follow-up on file.

## 2013-03-23 NOTE — Patient Instructions (Addendum)
You should think of watermelon ,  Grapes and pineapple as a DESSERT.  You should not have both watermelon  AND  A "DIET' PUDDING at the same meal.   You should snack on cheese with low carb crackers  or dip with celery/carrots   Return  first week in December for your  lab visit,  followed by [facility] a few days later

## 2013-03-24 ENCOUNTER — Encounter: Payer: Self-pay | Admitting: Internal Medicine

## 2013-03-24 LAB — COMPREHENSIVE METABOLIC PANEL
AST: 15 U/L (ref 0–37)
Albumin: 3.3 g/dL — ABNORMAL LOW (ref 3.5–5.2)
Alkaline Phosphatase: 107 U/L (ref 39–117)
BUN: 32 mg/dL — ABNORMAL HIGH (ref 6–23)
Calcium: 9 mg/dL (ref 8.4–10.5)
Chloride: 102 mEq/L (ref 96–112)
Glucose, Bld: 161 mg/dL — ABNORMAL HIGH (ref 70–99)
Potassium: 4.1 mEq/L (ref 3.5–5.1)
Sodium: 138 mEq/L (ref 135–145)
Total Protein: 5.7 g/dL — ABNORMAL LOW (ref 6.0–8.3)

## 2013-03-24 NOTE — Assessment & Plan Note (Addendum)
She has had mild improvement in control over the last 3 months. A1c is now below 9 but still high at 8.7. We discussed her new neuropathic  symptom today involving her left foot.   tenderness asymmetry I suspect it may be coming from spinal stenosis as opposed to diabetic neuropathy. Her sensation is intact to light touch.  Reviewed diet today and I recommended that she avoid milk watermelon and diet pudding on the same day since these are both high glycemic index. At her age however both she and I are not  inclined to pursue more aggressive control due to the diminished quality of life she will experience as well as the increased risk for hypoglycemic events. Continue aspirin and statin.

## 2013-03-24 NOTE — Assessment & Plan Note (Signed)
She has occasional anginal pain with increased anxiety and movement which is relieved with one sublingual nitroglycerin.

## 2013-03-24 NOTE — Assessment & Plan Note (Signed)
She has been following up with Dr. Luther Redo who states that her kidney function has improved and is stable. There is no longer the threat of hemodialysis. We have all agreed that her hemodialysis would not be in her best interest anyway.

## 2013-03-28 ENCOUNTER — Encounter: Payer: Self-pay | Admitting: Cardiovascular Disease

## 2013-03-28 ENCOUNTER — Ambulatory Visit (INDEPENDENT_AMBULATORY_CARE_PROVIDER_SITE_OTHER): Payer: MEDICARE | Admitting: Cardiovascular Disease

## 2013-03-28 ENCOUNTER — Encounter: Payer: Self-pay | Admitting: *Deleted

## 2013-03-28 VITALS — BP 160/60 | HR 69 | Ht 60.0 in | Wt 193.0 lb

## 2013-03-28 DIAGNOSIS — R0789 Other chest pain: Secondary | ICD-10-CM

## 2013-03-28 DIAGNOSIS — R609 Edema, unspecified: Secondary | ICD-10-CM

## 2013-03-28 DIAGNOSIS — N184 Chronic kidney disease, stage 4 (severe): Secondary | ICD-10-CM

## 2013-03-28 DIAGNOSIS — E1329 Other specified diabetes mellitus with other diabetic kidney complication: Secondary | ICD-10-CM

## 2013-03-28 DIAGNOSIS — R0602 Shortness of breath: Secondary | ICD-10-CM

## 2013-03-28 DIAGNOSIS — E1322 Other specified diabetes mellitus with diabetic chronic kidney disease: Secondary | ICD-10-CM

## 2013-03-28 DIAGNOSIS — I2510 Atherosclerotic heart disease of native coronary artery without angina pectoris: Secondary | ICD-10-CM

## 2013-03-28 DIAGNOSIS — I219 Acute myocardial infarction, unspecified: Secondary | ICD-10-CM

## 2013-03-28 DIAGNOSIS — I10 Essential (primary) hypertension: Secondary | ICD-10-CM

## 2013-03-28 DIAGNOSIS — R600 Localized edema: Secondary | ICD-10-CM

## 2013-03-28 DIAGNOSIS — E785 Hyperlipidemia, unspecified: Secondary | ICD-10-CM

## 2013-03-28 NOTE — Patient Instructions (Addendum)
You are doing well. No medication changes were made.  Please call us if you have new issues that need to be addressed before your next appt.  Your physician wants you to follow-up in: 6 months.  You will receive a reminder letter in the mail two months in advance. If you don't receive a letter, please call our office to schedule the follow-up appointment.   

## 2013-03-28 NOTE — Assessment & Plan Note (Signed)
We'll continue her generic Lipitor

## 2013-03-28 NOTE — Assessment & Plan Note (Signed)
Stable, wearing her compression hose. No complaints on today's visit. We'll continue Lasix daily

## 2013-03-28 NOTE — Progress Notes (Signed)
Patient ID: Sharon [name], female    DOB: 04/05/1916, 79 y.o.   MRN: [id number]  HPI Comments: Sharon [name] is a very pleasant 79 yo woman with history of coronary artery disease, turned down for bypass given renal dysfunction who presented to [facility] February 02 2012 with dizziness, lightheaded, bradycardia. She was found to be in junctional rhythm with heart rates in the 20s to 30s, low blood pressure with systolics in the 80s, potassium 5.5, worsening renal dysfunction consistent with dehydration. Initially presenting on metoprolol XL 25 mg daily. Notes indicated a history of congestive heart failure, cardiac catheterization in [place] several years earlier found to have three-vessel disease.   beta blocker was held with improvement of her arrhythmia and heart rate.  restarted on low-dose metoprolol tartrate twice a day for tachycardia in followup.  She is able to walk relatively quickly with a walker. No falls. She is currently living in assisted living [facility].  Notes provided today show Blood pressure in a good range at [facility], which is checked daily Sugars running high. Part of this is from poor diet. She denies any significant chest pain but does occasionally have Some gerd late at night, for which sometimes she requests nitroglycerin or Pepcid. Tingling in left leg/foot. TEDS hose in place which helps her edema. With walking, she does have mild shortness of breath but recovers relatively quickly. She is taking Lasix 40 mg daily. She has followup with renal M.D.  Notes from cardiologist in 2012 detailing cardiac cath in February 2012 with 40% left main disease, 70% proximal LAD disease, nonobstructed RCA disease Stent was not performed. Followup stress test in 2012  EKG shows normal sinus rhythm with rate 69 beats per minute,LBBB  Echocardiogram done in the hospital showed normal LV function, mildly dilated left atrium, mild TR, right ventricular systolic pressure  30-40 mmHg     Outpatient Encounter Prescriptions as of 03/28/2013  Medication Sig Dispense Refill  . aspirin 81 MG tablet Take 81 mg by mouth daily.      Marland Kitchen atorvastatin (LIPITOR) 20 MG tablet TAKE ONE TABLET BY MOUTH EACH DAY. (IMPROVES CHOLESTEROL)  30 tablet  5  . B-D ULTRAFINE III SHORT PEN 31G X 8 MM MISC daily.       . Calcium & Magnesium Carbonates (MYLANTA PO) Take by mouth as needed.      . ergocalciferol (VITAMIN D2) 50000 UNITS capsule Take 1 capsule (50,000 Units total) by mouth every 30 (thirty) days.  4 capsule  3  . famotidine (PEPCID) 20 MG tablet       . furosemide (LASIX) 40 MG tablet TAKE ONE TABLET BY MOUTH ONCE DAILY. (EDEMA/DIURETIC)  30 tablet  5  . HYDROcodone-homatropine (HYCODAN) 5-1.5 MG/5ML syrup Take 5 mLs by mouth every 8 (eight) hours as needed for cough.  120 mL  0  . insulin glargine (LANTUS) 100 UNIT/ML injection Inject 0.25 mLs (25 Units total) into the skin at bedtime.  10 mL    . isosorbide mononitrate (IMDUR) 60 MG 24 hr tablet TAKE 1 & 1/2 TABLETS BY MOUTH EVERY DAY IN THE MORNING FOR HEART.  45 tablet  5  . metoprolol tartrate (LOPRESSOR) 25 MG tablet TAKE 1/2 TABLET BY MOUTH TWICE A DAY. (HIGH BLOOD PRESSURE)  30 tablet  6  . nitroGLYCERIN (NITROSTAT) 0.4 MG SL tablet Place 1 tablet (0.4 mg total) under the tongue every 5 (five) minutes as needed for chest pain.  25 tablet  6  .  omeprazole (PRILOSEC) 40 MG capsule Take 1 capsule (40 mg total) by mouth daily.  90 capsule  3  . ondansetron (ZOFRAN) 4 MG tablet Take 4 mg by mouth every 8 (eight) hours as needed.      . pramipexole (MIRAPEX) 0.25 MG tablet TAKE ONE TABLET BY MOUTH 3 TIMES DAILY FOR PARKINSONS.  90 tablet  2  . ranolazine (RANEXA) 500 MG 12 hr tablet Take 1 tablet (500 mg total) by mouth 2 (two) times daily. Take and additional dose twice daily as needed for chest pain  90 tablet  3  . traMADol (ULTRAM) 50 MG tablet Take 50 mg by mouth every 8 (eight) hours as needed.       No  facility-administered encounter medications on file as of 03/28/2013.     Review of Systems  Constitutional: Negative.   HENT: Negative.   Eyes: Negative.   Gastrointestinal: Negative.   Skin: Negative.   Neurological: Negative.        Foot tingling  Psychiatric/Behavioral: Negative.   All other systems reviewed and are negative.   BP 160/60  Pulse 69  Ht 5' (1.524 m)  Wt 193 lb (87.544 kg)  BMI 37.69 kg/m2  Physical Exam  Nursing note and vitals reviewed. Constitutional: She is oriented to person, place, and time. She appears well-developed and well-nourished.  Elderly woman that walks relatively quickly with a 3 wheeled walker  HENT:  Head: Normocephalic.  Nose: Nose normal.  Mouth/Throat: Oropharynx is clear and moist.  Eyes: Conjunctivae are normal. Pupils are equal, round, and reactive to light.  Neck: Normal range of motion. Neck supple. No JVD present.  Cardiovascular: Normal rate, regular rhythm, S1 normal, S2 normal, normal heart sounds and intact distal pulses.  Exam reveals no gallop and no friction rub.   No murmur heard. Pulmonary/Chest: Effort normal and breath sounds normal. No respiratory distress. She has no wheezes. She has no rales. She exhibits no tenderness.  Abdominal: Soft. Bowel sounds are normal. She exhibits no distension. There is no tenderness.  Musculoskeletal: Normal range of motion. She exhibits no edema and no tenderness.  Lymphadenopathy:    She has no cervical adenopathy.  Neurological: She is alert and oriented to person, place, and time. Coordination normal.  Skin: Skin is warm and dry. No rash noted. No erythema.  Psychiatric: She has a normal mood and affect. Her behavior is normal. Judgment and thought content normal.    Assessment and Plan

## 2013-03-28 NOTE — Assessment & Plan Note (Signed)
Currently with no symptoms of angina. No further workup at this time. Continue current medication regimen. 

## 2013-03-28 NOTE — Assessment & Plan Note (Signed)
Talked with her about watching her diet more closely. Continue her aggressive walking regimen

## 2013-03-28 NOTE — Assessment & Plan Note (Signed)
Blood pressure well controlled at [facility]. No changes to her medications. Mildly elevated today after exertion to climb onto the exam table

## 2013-03-29 ENCOUNTER — Telehealth: Payer: Self-pay | Admitting: Internal Medicine

## 2013-03-29 NOTE — Telephone Encounter (Signed)
Mandy calling from [facility], states she left a vm yesterday regarding whether or not pt is to continue Pepcid 20 mg twice daily now that she is to restart omeprazole.

## 2013-03-30 NOTE — Telephone Encounter (Signed)
Pepcid not D/C on office notes 03/23/13

## 2013-04-06 ENCOUNTER — Telehealth: Payer: Self-pay | Admitting: *Deleted

## 2013-04-06 MED ORDER — ONDANSETRON HCL 4 MG PO TABS: 4.0000 mg | ORAL_TABLET | Freq: Three times a day (TID) | ORAL | Status: DC | PRN

## 2013-04-06 MED ORDER — TRAMADOL HCL 50 MG PO TABS: 50.0000 mg | ORAL_TABLET | Freq: Three times a day (TID) | ORAL | Status: DC | PRN

## 2013-04-06 NOTE — Telephone Encounter (Signed)
Mandy from facility called and needs clarification on Pepcid as to wether medication is schedule BID or PRN BID please advise. Also patient has two PRN medications Zofran and Tramadol called [facility] Faxed new script for tramadol to pharmacy. Zofran sent electronically.

## 2013-04-26 DIAGNOSIS — I5042 Chronic combined systolic (congestive) and diastolic (congestive) heart failure: Secondary | ICD-10-CM

## 2013-04-26 HISTORY — PX: OTHER SURGICAL HISTORY: SHX169

## 2013-04-26 HISTORY — DX: Chronic combined systolic (congestive) and diastolic (congestive) heart failure: I50.42

## 2013-05-01 ENCOUNTER — Ambulatory Visit (INDEPENDENT_AMBULATORY_CARE_PROVIDER_SITE_OTHER): Payer: MEDICARE | Admitting: Adult Health

## 2013-05-01 ENCOUNTER — Encounter: Payer: Self-pay | Admitting: Adult Health

## 2013-05-01 ENCOUNTER — Emergency Department: Payer: Self-pay | Admitting: Emergency Medicine

## 2013-05-01 ENCOUNTER — Inpatient Hospital Stay (HOSPITAL_COMMUNITY): Admit: 2013-05-01 | Payer: Self-pay | Source: Other Acute Inpatient Hospital | Admitting: Pulmonary Disease

## 2013-05-01 VITALS — BP 130/70 | HR 113 | Temp 98.9°F | Resp 30 | Wt 192.0 lb

## 2013-05-01 DIAGNOSIS — J209 Acute bronchitis, unspecified: Secondary | ICD-10-CM

## 2013-05-01 DIAGNOSIS — R062 Wheezing: Secondary | ICD-10-CM

## 2013-05-01 LAB — URINALYSIS, COMPLETE
Glucose,UR: NEGATIVE mg/dL (ref 0–75)
Nitrite: NEGATIVE
Specific Gravity: 1.017 (ref 1.003–1.030)
Squamous Epithelial: NONE SEEN
WBC UR: 25 /HPF (ref 0–5)

## 2013-05-01 LAB — COMPREHENSIVE METABOLIC PANEL
Albumin: 2.8 g/dL — ABNORMAL LOW (ref 3.4–5.0)
BUN: 25 mg/dL — ABNORMAL HIGH (ref 7–18)
Bilirubin,Total: 0.6 mg/dL (ref 0.2–1.0)
Co2: 25 mmol/L (ref 21–32)
EGFR (African American): 22 — ABNORMAL LOW
Osmolality: 290 (ref 275–301)
Potassium: 4 mmol/L (ref 3.5–5.1)
SGOT(AST): 20 U/L (ref 15–37)
SGPT (ALT): 14 U/L (ref 12–78)
Total Protein: 6.2 g/dL — ABNORMAL LOW (ref 6.4–8.2)

## 2013-05-01 LAB — CK TOTAL AND CKMB (NOT AT ARMC)
CK, Total: 102 U/L (ref 21–215)
CK-MB: 3.2 ng/mL (ref 0.5–3.6)

## 2013-05-01 LAB — CBC
HGB: 12.2 g/dL (ref 12.0–16.0)
MCH: 29.7 pg (ref 26.0–34.0)
MCHC: 33.1 g/dL (ref 32.0–36.0)
Platelet: 239 10*3/uL (ref 150–440)
RDW: 14.6 % — ABNORMAL HIGH (ref 11.5–14.5)

## 2013-05-01 MED ORDER — ALBUTEROL SULFATE (2.5 MG/3ML) 0.083% IN NEBU: 2.5000 mg | INHALATION_SOLUTION | Freq: Four times a day (QID) | RESPIRATORY_TRACT | Status: DC | PRN

## 2013-05-01 MED ORDER — MOXIFLOXACIN HCL 400 MG PO TABS: 400.0000 mg | ORAL_TABLET | Freq: Every day | ORAL | Status: DC

## 2013-05-01 MED ORDER — GUAIFENESIN-DM 100-10 MG/5ML PO SYRP: 5.0000 mL | ORAL_SOLUTION | Freq: Three times a day (TID) | ORAL | Status: AC | PRN

## 2013-05-01 MED ORDER — ALBUTEROL SULFATE (2.5 MG/3ML) 0.083% IN NEBU
2.5000 mg | INHALATION_SOLUTION | Freq: Once | RESPIRATORY_TRACT | Status: AC
  Administered 2013-05-01: 2.5 mg via RESPIRATORY_TRACT

## 2013-05-01 MED ORDER — PREDNISONE 10 MG PO TABS: ORAL_TABLET | ORAL | Status: DC

## 2013-05-01 NOTE — Progress Notes (Signed)
Subjective:    Patient ID: Sharon [name], female    DOB: [birth year], 79 y.o.   MRN: [id number]  HPI  Pt is pleasant 79 yo female, presents to clinic with cough and chest congestion since Saturday. Pt states she feels like she needs to cough something up but is unable to.  Pt states she went to [place] this past week to visit her sister who had similar symptoms.  Pt also reports low grade fever of 99 yesterday.  Pt reports difficulty sleeping the past 2 nights d/t nasal congestion. Pt presents to clinic with her son. She is a resident of [facility].   Current Outpatient Prescriptions on File Prior to Visit  Medication Sig Dispense Refill  . aspirin 81 MG tablet Take 81 mg by mouth daily.      Marland Kitchen atorvastatin (LIPITOR) 20 MG tablet TAKE ONE TABLET BY MOUTH EACH DAY. (IMPROVES CHOLESTEROL)  30 tablet  5  . B-D ULTRAFINE III SHORT PEN 31G X 8 MM MISC daily.       . Calcium & Magnesium Carbonates (MYLANTA PO) Take by mouth as needed.      . ergocalciferol (VITAMIN D2) 50000 UNITS capsule Take 1 capsule (50,000 Units total) by mouth every 30 (thirty) days.  4 capsule  3  . famotidine (PEPCID) 20 MG tablet Take 20 mg by mouth 2 (two) times daily as needed.       . furosemide (LASIX) 40 MG tablet TAKE ONE TABLET BY MOUTH ONCE DAILY. (EDEMA/DIURETIC)  30 tablet  5  . insulin glargine (LANTUS) 100 UNIT/ML injection Inject 0.25 mLs (25 Units total) into the skin at bedtime.  10 mL    . isosorbide mononitrate (IMDUR) 60 MG 24 hr tablet TAKE 1 & 1/2 TABLETS BY MOUTH EVERY DAY IN THE MORNING FOR HEART.  45 tablet  5  . metoprolol tartrate (LOPRESSOR) 25 MG tablet TAKE 1/2 TABLET BY MOUTH TWICE A DAY. (HIGH BLOOD PRESSURE)  30 tablet  6  . nitroGLYCERIN (NITROSTAT) 0.4 MG SL tablet Place 1 tablet (0.4 mg total) under the tongue every 5 (five) minutes as needed for chest pain.  25 tablet  6  . ondansetron (ZOFRAN) 4 MG tablet Take 1 tablet (4 mg total) by mouth every 8 (eight) hours as  needed.  30 tablet  5  . ranolazine (RANEXA) 500 MG 12 hr tablet Take 1 tablet (500 mg total) by mouth 2 (two) times daily. Take and additional dose twice daily as needed for chest pain  90 tablet  3  . traMADol (ULTRAM) 50 MG tablet Take 1 tablet (50 mg total) by mouth every 8 (eight) hours as needed.  30 tablet  5   No current facility-administered medications on file prior to visit.      Review of Systems  Constitutional: Positive for fever, appetite change and fatigue.  HENT: Positive for congestion and sinus pressure.   Respiratory: Positive for cough and shortness of breath.   Cardiovascular: Negative.  Negative for chest pain.  Gastrointestinal: Negative.   Musculoskeletal: Negative.   Skin: Negative.   Neurological: Negative for headaches.  Psychiatric/Behavioral: Negative.   All other systems reviewed and are negative.       Objective:   Physical Exam  Constitutional: She appears well-developed and well-nourished.  Cardiovascular: Regular rhythm.  Tachycardia present.   Pulmonary/Chest: Tachypnea noted. She has wheezes. She has rhonchi.  Rhonchi does not clear with coughing.  Skin: Skin is warm, dry and intact.  Psychiatric: She has a normal mood and affect. Her speech is normal and behavior is normal. Judgment and thought content normal. Cognition and memory are normal.    BP 130/70  Pulse 113  Temp(Src) 98.9 F (37.2 C) (Oral)  Resp 30  Wt 192 lb (87.091 kg)  BMI 37.5 kg/m2  SpO2 92%       Assessment & Plan:

## 2013-05-01 NOTE — Assessment & Plan Note (Addendum)
Patient with wheezing that is audible without a stethoscope. Scattered rhonchi that does not clear with coughing. My recommendation was to send her to the emergency room for treatment and possible admission ; however, patient's son stated that her assisted living facility, [facility], has a unit where she could be admitted for closer RN observation and treatment. I spoke with the nurse over the telephone and she assured me that orders could be followed and that patient will be admitted to this unit. Start Avelox 400 mg daily x14 days. Prednisone taper starting with 60 mg and decreasing by 5 mg daily until completed. Nebulizer treatment every 6 hours with albuterol as needed for wheezing, shortness of breath. Robitussin DM for cough. RTC for followup in 3 days. Note greater than 40 minutes were spent in face-to-face communication with this patient in the coordination of care pertaining to this problem.

## 2013-05-01 NOTE — Patient Instructions (Addendum)
  Start Avelox 400 mg daily for 14 days.  Nebulizer treatment every 6 hours as needed for wheezing, shortness of breath. Albuterol 2.5 mg/3 ml every 6 hours prn  Also begin a prednisone taper as follows:   Day one-6 tablets  Day 2 - 5 1/2 tablets  Day 3 - 5 tablets  Day 4 - 4 1/2 tablets  Day 5 - 4 tablets  Day 6 - 3 1/2 tablets  Day 7 - 3 tablets  Day 8 - 2 1/2 tablets  Day 9 - 2 tablets  Day 10 - 1 1/2 tablets  Day 11 - 1 tablet  Day 12 - 1/2 tablet and complete  Robitussin DM 1 tsp 3 times a day as needed for cough.

## 2013-05-02 ENCOUNTER — Inpatient Hospital Stay (HOSPITAL_COMMUNITY): Payer: MEDICARE

## 2013-05-02 ENCOUNTER — Inpatient Hospital Stay (HOSPITAL_COMMUNITY)
Admission: AD | Admit: 2013-05-02 | Discharge: 2013-05-08 | DRG: 280 | Disposition: A | Discharge status: Skilled Nursing Facility | Payer: MEDICARE | Source: Other Acute Inpatient Hospital | Attending: Internal Medicine | Admitting: Internal Medicine

## 2013-05-02 ENCOUNTER — Other Ambulatory Visit: Payer: Self-pay | Admitting: Internal Medicine

## 2013-05-02 ENCOUNTER — Encounter (HOSPITAL_COMMUNITY): Payer: Self-pay | Admitting: Internal Medicine

## 2013-05-02 DIAGNOSIS — I2510 Atherosclerotic heart disease of native coronary artery without angina pectoris: Secondary | ICD-10-CM | POA: Diagnosis present

## 2013-05-02 DIAGNOSIS — E1329 Other specified diabetes mellitus with other diabetic kidney complication: Secondary | ICD-10-CM

## 2013-05-02 DIAGNOSIS — M1990 Unspecified osteoarthritis, unspecified site: Secondary | ICD-10-CM | POA: Diagnosis present

## 2013-05-02 DIAGNOSIS — N179 Acute kidney failure, unspecified: Secondary | ICD-10-CM | POA: Diagnosis present

## 2013-05-02 DIAGNOSIS — Z7982 Long term (current) use of aspirin: Secondary | ICD-10-CM

## 2013-05-02 DIAGNOSIS — I5023 Acute on chronic systolic (congestive) heart failure: Secondary | ICD-10-CM | POA: Diagnosis present

## 2013-05-02 DIAGNOSIS — M1711 Unilateral primary osteoarthritis, right knee: Secondary | ICD-10-CM

## 2013-05-02 DIAGNOSIS — Z794 Long term (current) use of insulin: Secondary | ICD-10-CM

## 2013-05-02 DIAGNOSIS — IMO0002 Reserved for concepts with insufficient information to code with codable children: Secondary | ICD-10-CM | POA: Diagnosis present

## 2013-05-02 DIAGNOSIS — I5021 Acute systolic (congestive) heart failure: Secondary | ICD-10-CM

## 2013-05-02 DIAGNOSIS — J9600 Acute respiratory failure, unspecified whether with hypoxia or hypercapnia: Secondary | ICD-10-CM | POA: Diagnosis present

## 2013-05-02 DIAGNOSIS — J209 Acute bronchitis, unspecified: Secondary | ICD-10-CM

## 2013-05-02 DIAGNOSIS — I129 Hypertensive chronic kidney disease with stage 1 through stage 4 chronic kidney disease, or unspecified chronic kidney disease: Secondary | ICD-10-CM | POA: Diagnosis present

## 2013-05-02 DIAGNOSIS — Z66 Do not resuscitate: Secondary | ICD-10-CM | POA: Diagnosis present

## 2013-05-02 DIAGNOSIS — E785 Hyperlipidemia, unspecified: Secondary | ICD-10-CM | POA: Diagnosis present

## 2013-05-02 DIAGNOSIS — Z79899 Other long term (current) drug therapy: Secondary | ICD-10-CM

## 2013-05-02 DIAGNOSIS — I219 Acute myocardial infarction, unspecified: Secondary | ICD-10-CM

## 2013-05-02 DIAGNOSIS — N184 Chronic kidney disease, stage 4 (severe): Secondary | ICD-10-CM | POA: Diagnosis present

## 2013-05-02 DIAGNOSIS — I499 Cardiac arrhythmia, unspecified: Secondary | ICD-10-CM

## 2013-05-02 DIAGNOSIS — I509 Heart failure, unspecified: Secondary | ICD-10-CM

## 2013-05-02 DIAGNOSIS — M12819 Other specific arthropathies, not elsewhere classified, unspecified shoulder: Secondary | ICD-10-CM

## 2013-05-02 DIAGNOSIS — G2581 Restless legs syndrome: Secondary | ICD-10-CM | POA: Diagnosis present

## 2013-05-02 DIAGNOSIS — J441 Chronic obstructive pulmonary disease with (acute) exacerbation: Secondary | ICD-10-CM | POA: Diagnosis present

## 2013-05-02 DIAGNOSIS — D649 Anemia, unspecified: Secondary | ICD-10-CM | POA: Diagnosis present

## 2013-05-02 DIAGNOSIS — Z8673 Personal history of transient ischemic attack (TIA), and cerebral infarction without residual deficits: Secondary | ICD-10-CM

## 2013-05-02 DIAGNOSIS — I369 Nonrheumatic tricuspid valve disorder, unspecified: Secondary | ICD-10-CM

## 2013-05-02 DIAGNOSIS — I10 Essential (primary) hypertension: Secondary | ICD-10-CM | POA: Diagnosis present

## 2013-05-02 DIAGNOSIS — K219 Gastro-esophageal reflux disease without esophagitis: Secondary | ICD-10-CM | POA: Diagnosis present

## 2013-05-02 DIAGNOSIS — E1322 Other specified diabetes mellitus with diabetic chronic kidney disease: Secondary | ICD-10-CM | POA: Diagnosis present

## 2013-05-02 DIAGNOSIS — R600 Localized edema: Secondary | ICD-10-CM

## 2013-05-02 DIAGNOSIS — I214 Non-ST elevation (NSTEMI) myocardial infarction: Principal | ICD-10-CM | POA: Diagnosis present

## 2013-05-02 DIAGNOSIS — E1365 Other specified diabetes mellitus with hyperglycemia: Secondary | ICD-10-CM | POA: Diagnosis present

## 2013-05-02 HISTORY — DX: Hemorrhage, not elsewhere classified: R58

## 2013-05-02 LAB — CBC WITH DIFFERENTIAL/PLATELET
Basophils Relative: 0 % (ref 0–1)
Eosinophils Absolute: 0.1 10*3/uL (ref 0.0–0.7)
Eosinophils Relative: 0 % (ref 0–5)
Lymphocytes Relative: 11 % — ABNORMAL LOW (ref 12–46)
MCH: 29.3 pg (ref 26.0–34.0)
MCV: 89.4 fL (ref 78.0–100.0)
Neutrophils Relative %: 82 % — ABNORMAL HIGH (ref 43–77)
Platelets: 201 10*3/uL (ref 150–400)
RBC: 3.69 MIL/uL — ABNORMAL LOW (ref 3.87–5.11)
RDW: 14.7 % (ref 11.5–15.5)
WBC: 13 10*3/uL — ABNORMAL HIGH (ref 4.0–10.5)

## 2013-05-02 LAB — COMPREHENSIVE METABOLIC PANEL
AST: 51 U/L — ABNORMAL HIGH (ref 0–37)
Albumin: 2.7 g/dL — ABNORMAL LOW (ref 3.5–5.2)
Alkaline Phosphatase: 93 U/L (ref 39–117)
BUN: 32 mg/dL — ABNORMAL HIGH (ref 6–23)
CO2: 30 mEq/L (ref 19–32)
Chloride: 102 mEq/L (ref 96–112)
Creatinine, Ser: 2.44 mg/dL — ABNORMAL HIGH (ref 0.50–1.10)
GFR calc Af Amer: 18 mL/min — ABNORMAL LOW (ref >90)
GFR calc non Af Amer: 16 mL/min — ABNORMAL LOW (ref >90)
Glucose, Bld: 219 mg/dL — ABNORMAL HIGH (ref 70–99)
Potassium: 4.3 mEq/L (ref 3.5–5.1)
Total Bilirubin: 0.7 mg/dL (ref 0.3–1.2)

## 2013-05-02 LAB — GLUCOSE, CAPILLARY
Glucose-Capillary: 202 mg/dL — ABNORMAL HIGH (ref 70–99)
Glucose-Capillary: 146 mg/dL — ABNORMAL HIGH (ref 70–99)
Glucose-Capillary: 106 mg/dL — ABNORMAL HIGH (ref 70–99)
Glucose-Capillary: 165 mg/dL — ABNORMAL HIGH (ref 70–99)

## 2013-05-02 LAB — HEPARIN LEVEL (UNFRACTIONATED)
Heparin Unfractionated: 0.53 IU/mL (ref 0.30–0.70)
Heparin Unfractionated: 0.31 IU/mL (ref 0.30–0.70)

## 2013-05-02 LAB — BLOOD GAS, ARTERIAL
Acid-Base Excess: 3.1 mmol/L — ABNORMAL HIGH (ref 0.0–2.0)
Drawn by: 347641
FIO2: 0.4 %
O2 Content: 5 L/min
O2 Saturation: 95.7 %
Patient temperature: 98.8
pCO2 arterial: 46.5 mmHg — ABNORMAL HIGH (ref 35.0–45.0)

## 2013-05-02 LAB — CK: Total CK: 517 U/L — ABNORMAL HIGH (ref 7–177)

## 2013-05-02 LAB — TROPONIN I
Troponin I: 18.43 ng/mL (ref <0.30)
Troponin I: >20 ng/mL (ref <0.30)

## 2013-05-02 MED ORDER — FUROSEMIDE 10 MG/ML IJ SOLN: 40.0000 mg | Freq: Two times a day (BID) | INTRAMUSCULAR | Status: DC

## 2013-05-02 MED ORDER — FAMOTIDINE 20 MG PO TABS
20.0000 mg | ORAL_TABLET | Freq: Two times a day (BID) | ORAL | Status: DC | PRN
  Filled 2013-05-02 (×2): qty 1

## 2013-05-02 MED ORDER — NITROGLYCERIN 0.4 MG SL SUBL: 0.4000 mg | SUBLINGUAL_TABLET | SUBLINGUAL | Status: DC | PRN

## 2013-05-02 MED ORDER — PRAMIPEXOLE DIHYDROCHLORIDE 0.25 MG PO TABS
0.5000 mg | ORAL_TABLET | Freq: Every day | ORAL | Status: DC
  Administered 2013-05-02 – 2013-05-07 (×6): 0.5 mg via ORAL
  Filled 2013-05-02 (×7): qty 2

## 2013-05-02 MED ORDER — METOPROLOL TARTRATE 12.5 MG HALF TABLET
12.5000 mg | ORAL_TABLET | Freq: Two times a day (BID) | ORAL | Status: DC
  Administered 2013-05-02 – 2013-05-08 (×13): 12.5 mg via ORAL
  Filled 2013-05-02 (×14): qty 1

## 2013-05-02 MED ORDER — METHYLPREDNISOLONE SODIUM SUCC 40 MG IJ SOLR
40.0000 mg | Freq: Two times a day (BID) | INTRAMUSCULAR | Status: DC
  Administered 2013-05-02 – 2013-05-05 (×6): 40 mg via INTRAVENOUS
  Filled 2013-05-02 (×8): qty 1

## 2013-05-02 MED ORDER — ATORVASTATIN CALCIUM 40 MG PO TABS
40.0000 mg | ORAL_TABLET | Freq: Every day | ORAL | Status: DC
  Administered 2013-05-03 – 2013-05-07 (×5): 40 mg via ORAL
  Filled 2013-05-02 (×8): qty 1

## 2013-05-02 MED ORDER — IPRATROPIUM BROMIDE 0.02 % IN SOLN: 0.5000 mg | RESPIRATORY_TRACT | Status: DC

## 2013-05-02 MED ORDER — VANCOMYCIN HCL IN DEXTROSE 1-5 GM/200ML-% IV SOLN
1000.0000 mg | Freq: Once | INTRAVENOUS | Status: DC
  Filled 2013-05-02: qty 200

## 2013-05-02 MED ORDER — ALBUTEROL SULFATE (5 MG/ML) 0.5% IN NEBU
2.5000 mg | INHALATION_SOLUTION | RESPIRATORY_TRACT | Status: DC | PRN
  Administered 2013-05-06: 2.5 mg via RESPIRATORY_TRACT
  Filled 2013-05-02: qty 0.5

## 2013-05-02 MED ORDER — VANCOMYCIN HCL IN DEXTROSE 1-5 GM/200ML-% IV SOLN
1000.0000 mg | INTRAVENOUS | Status: DC
  Filled 2013-05-02: qty 200

## 2013-05-02 MED ORDER — ACETAMINOPHEN 650 MG RE SUPP: 650.0000 mg | Freq: Four times a day (QID) | RECTAL | Status: DC | PRN

## 2013-05-02 MED ORDER — ASPIRIN EC 325 MG PO TBEC
325.0000 mg | DELAYED_RELEASE_TABLET | Freq: Every day | ORAL | Status: DC
  Administered 2013-05-02: 325 mg via ORAL
  Filled 2013-05-02: qty 1

## 2013-05-02 MED ORDER — FUROSEMIDE 10 MG/ML IJ SOLN
40.0000 mg | Freq: Every day | INTRAMUSCULAR | Status: DC
  Filled 2013-05-02: qty 4

## 2013-05-02 MED ORDER — SODIUM CHLORIDE 0.9 % IJ SOLN
3.0000 mL | Freq: Two times a day (BID) | INTRAMUSCULAR | Status: DC
  Administered 2013-05-03 – 2013-05-08 (×7): 3 mL via INTRAVENOUS

## 2013-05-02 MED ORDER — CEFEPIME HCL 1 G IJ SOLR
1.0000 g | Freq: Once | INTRAMUSCULAR | Status: AC
  Administered 2013-05-02: 1 g via INTRAVENOUS
  Filled 2013-05-02: qty 1

## 2013-05-02 MED ORDER — INSULIN ASPART 100 UNIT/ML ~~LOC~~ SOLN
0.0000 [IU] | Freq: Three times a day (TID) | SUBCUTANEOUS | Status: DC
  Administered 2013-05-02: 3 [IU] via SUBCUTANEOUS
  Administered 2013-05-02: 1 [IU] via SUBCUTANEOUS
  Administered 2013-05-03: 3 [IU] via SUBCUTANEOUS

## 2013-05-02 MED ORDER — FUROSEMIDE 10 MG/ML IJ SOLN
40.0000 mg | Freq: Every day | INTRAMUSCULAR | Status: DC
  Administered 2013-05-02: 40 mg via INTRAVENOUS
  Filled 2013-05-02: qty 4

## 2013-05-02 MED ORDER — HEPARIN (PORCINE) IN NACL 100-0.45 UNIT/ML-% IJ SOLN
1050.0000 [IU]/h | INTRAMUSCULAR | Status: DC
  Administered 2013-05-02 – 2013-05-03 (×2): 750 [IU]/h via INTRAVENOUS
  Administered 2013-05-03 – 2013-05-04 (×2): 1050 [IU]/h via INTRAVENOUS
  Filled 2013-05-02 (×5): qty 250

## 2013-05-02 MED ORDER — SODIUM CHLORIDE 0.9 % IJ SOLN
3.0000 mL | Freq: Two times a day (BID) | INTRAMUSCULAR | Status: DC
Start: 2013-05-02 — End: 2013-05-08
  Administered 2013-05-02 – 2013-05-08 (×11): 3 mL via INTRAVENOUS

## 2013-05-02 MED ORDER — IPRATROPIUM BROMIDE 0.02 % IN SOLN
RESPIRATORY_TRACT | Status: AC
  Administered 2013-05-02: 0.5 mg via RESPIRATORY_TRACT
  Filled 2013-05-02: qty 2.5

## 2013-05-02 MED ORDER — ACETAMINOPHEN 325 MG PO TABS: 650.0000 mg | ORAL_TABLET | Freq: Four times a day (QID) | ORAL | Status: DC | PRN

## 2013-05-02 MED ORDER — IPRATROPIUM BROMIDE 0.02 % IN SOLN
0.5000 mg | Freq: Four times a day (QID) | RESPIRATORY_TRACT | Status: DC
  Administered 2013-05-02 – 2013-05-08 (×22): 0.5 mg via RESPIRATORY_TRACT
  Filled 2013-05-02 (×21): qty 2.5

## 2013-05-02 MED ORDER — NITROGLYCERIN 2 % TD OINT
1.0000 [in_us] | TOPICAL_OINTMENT | Freq: Four times a day (QID) | TRANSDERMAL | Status: DC
  Administered 2013-05-02 – 2013-05-03 (×3): 1 [in_us] via TOPICAL
  Filled 2013-05-02: qty 30

## 2013-05-02 MED ORDER — ISOSORBIDE MONONITRATE ER 30 MG PO TB24
30.0000 mg | ORAL_TABLET | Freq: Every day | ORAL | Status: DC
  Administered 2013-05-02 – 2013-05-08 (×7): 30 mg via ORAL
  Filled 2013-05-02 (×7): qty 1

## 2013-05-02 MED ORDER — VANCOMYCIN HCL IN DEXTROSE 1-5 GM/200ML-% IV SOLN: 1000.0000 mg | INTRAVENOUS | Status: DC

## 2013-05-02 MED ORDER — ALBUTEROL SULFATE (5 MG/ML) 0.5% IN NEBU
2.5000 mg | INHALATION_SOLUTION | Freq: Four times a day (QID) | RESPIRATORY_TRACT | Status: DC
  Administered 2013-05-02 – 2013-05-08 (×23): 2.5 mg via RESPIRATORY_TRACT
  Filled 2013-05-02 (×24): qty 0.5

## 2013-05-02 MED ORDER — TRAMADOL HCL 50 MG PO TABS: 50.0000 mg | ORAL_TABLET | Freq: Three times a day (TID) | ORAL | Status: DC | PRN

## 2013-05-02 MED ORDER — SODIUM CHLORIDE 0.9 % IV SOLN
INTRAVENOUS | Status: DC
  Administered 2013-05-03: via INTRAVENOUS

## 2013-05-02 MED ORDER — INSULIN GLARGINE 100 UNIT/ML ~~LOC~~ SOLN
25.0000 [IU] | Freq: Every day | SUBCUTANEOUS | Status: DC
  Administered 2013-05-02 – 2013-05-03 (×2): 25 [IU] via SUBCUTANEOUS
  Filled 2013-05-02 (×3): qty 0.25

## 2013-05-02 MED ORDER — ATORVASTATIN CALCIUM 20 MG PO TABS
20.0000 mg | ORAL_TABLET | Freq: Every day | ORAL | Status: DC
  Filled 2013-05-02: qty 1

## 2013-05-02 MED ORDER — ONDANSETRON HCL 4 MG PO TABS: 4.0000 mg | ORAL_TABLET | Freq: Four times a day (QID) | ORAL | Status: DC | PRN

## 2013-05-02 MED ORDER — BUDESONIDE 0.25 MG/2ML IN SUSP
0.2500 mg | Freq: Two times a day (BID) | RESPIRATORY_TRACT | Status: DC
  Administered 2013-05-02 – 2013-05-08 (×11): 0.25 mg via RESPIRATORY_TRACT
  Filled 2013-05-02 (×16): qty 2

## 2013-05-02 MED ORDER — DEXTROSE 5 % IV SOLN
1.0000 g | INTRAVENOUS | Status: DC
  Filled 2013-05-02: qty 1

## 2013-05-02 MED ORDER — HEPARIN BOLUS VIA INFUSION
2500.0000 [IU] | Freq: Once | INTRAVENOUS | Status: AC
  Administered 2013-05-02: 2500 [IU] via INTRAVENOUS
  Filled 2013-05-02: qty 2500

## 2013-05-02 MED ORDER — RANOLAZINE ER 500 MG PO TB12
500.0000 mg | ORAL_TABLET | Freq: Two times a day (BID) | ORAL | Status: DC
  Administered 2013-05-02 – 2013-05-08 (×13): 500 mg via ORAL
  Filled 2013-05-02 (×14): qty 1

## 2013-05-02 MED ORDER — ONDANSETRON HCL 4 MG/2ML IJ SOLN: 4.0000 mg | Freq: Four times a day (QID) | INTRAMUSCULAR | Status: DC | PRN

## 2013-05-02 NOTE — H&P (Addendum)
[facility] History and Physical  Sharon [name] ZOX:[id number] DOB: May 31, 1916 DOA: 05/02/2013  Referring physician: Patient was transferred from [facility] for further management as there was no critical care beds available. PCP: Duncan Dull, MD  Specialists: Dr. Lewie Loron. Cardiologist.  Chief Complaint: Shortness of breath.  HPI: Sharon [name] is a 79 y.o. female with known history of CAD and being treated conservatively as surgery was deferred due to her chronic kidney disease, diabetes mellitus was brought to the ER at [facility] due to acute shortness of breath. In the ER patient had a chest x-ray which has per the ER physician nose showed pulmonary edema and was placed on BiPAP. Patient was given Lasix 40 mg IV along with vancomycin and Zosyn. Critical care at [facility] was initially consulted. Since patient was eventually able to be weaned off BiPAP patient was transferred to step down under hospitalist care. Patient states that over the last 2 days she's been having shortness of breath with wheezing and cough. At the nursing home patient was placed on steroids and Avelox despite which patient's symptoms did not improve. Denies any chest pain nausea vomiting abdominal pain diarrhea. In the ER at [facility] patient's labs showed mildly elevated troponin at 0.4. Patient's EKG was showing LBBB which as per the ER physician was chronic. BNP was around 7000. Presently on my exam patient is mildly short of breath and is on 5 L oxygen.  Review of Systems: As presented in the history of presenting illness, rest negative.  Past Medical History  Diagnosis Date  . Diabetes mellitus, type 2   . Diabetic nephropathy   . GERD (gastroesophageal reflux disease)   . Essential hypertension, benign   . Coronary atherosclerosis of native coronary artery   . Venous stasis   . Osteoarthritis     with chronic back pain  . Osteopenia   . Urge incontinence      mild  . History of CVA (cerebrovascular accident) 7  . Vitamin D deficiency   . Hyperlipidemia   . Vertigo   . Restless leg syndrome   . Anemia   . Hiatal hernia    Past Surgical History  Procedure Laterality Date  . Bladder repair  1988  . Total abdominal hysterectomy  1980  . Neck surgery  1991  . Lumbar laminectomy    . Knee arthroscopy    . Spine surgery      lumbar laminectomy   Social History:  reports that she has never smoked. She has never used smokeless tobacco. She reports that she does not drink alcohol or use illicit drugs. Nursing home. where does patient live-- yes Can patient participate in ADLs?  Allergies  Allergen Reactions  . Codeine     Nausea & vomiting  . Ezetimibe   . Hydrocodone Bitartrate   . Latex   . Levaquin [Levofloxacin] Nausea And Vomiting  . Naproxen     Family History  Problem Relation Age of Onset  . Diabetes Mother       Prior to Admission medications   Medication Sig Start Date End Date Taking? Authorizing Provider  albuterol (PROVENTIL) (2.5 MG/3ML) 0.083% nebulizer solution Take 3 mLs (2.5 mg total) by nebulization every 6 (six) hours as needed for wheezing. 05/01/13   Orville Govern, NP  aspirin 81 MG tablet Take 81 mg by mouth daily.    Historical Provider, MD  atorvastatin (LIPITOR) 20 MG tablet TAKE ONE TABLET BY MOUTH EACH DAY. (IMPROVES CHOLESTEROL)  11/21/12   Sherlene Shams, MD  B-D ULTRAFINE III SHORT PEN 31G X 8 MM MISC daily.  10/27/12   Historical Provider, MD  Calcium & Magnesium Carbonates (MYLANTA PO) Take by mouth as needed.    Historical Provider, MD  ergocalciferol (VITAMIN D2) 50000 UNITS capsule Take 1 capsule (50,000 Units total) by mouth every 30 (thirty) days. 12/08/12   Sherlene Shams, MD  famotidine (PEPCID) 20 MG tablet Take 20 mg by mouth 2 (two) times daily as needed.  11/21/12   Sherlene Shams, MD  furosemide (LASIX) 40 MG tablet TAKE ONE TABLET BY MOUTH ONCE DAILY. (EDEMA/DIURETIC) 03/08/13   Sherlene Shams, MD  guaiFENesin-dextromethorphan (ROBITUSSIN DM) 100-10 MG/5ML syrup Take 5 mLs by mouth 3 (three) times daily as needed for cough. 05/01/13   Raquel Rey, NP  insulin glargine (LANTUS) 100 UNIT/ML injection Inject 0.25 mLs (25 Units total) into the skin at bedtime. 12/22/12   Sherlene Shams, MD  isosorbide mononitrate (IMDUR) 60 MG 24 hr tablet TAKE 1 & 1/2 TABLETS BY MOUTH EVERY DAY IN THE MORNING FOR HEART. 11/21/12   Sherlene Shams, MD  metoprolol tartrate (LOPRESSOR) 25 MG tablet TAKE 1/2 TABLET BY MOUTH TWICE A DAY. (HIGH BLOOD PRESSURE) 10/07/12   Sherlene Shams, MD  moxifloxacin (AVELOX) 400 MG tablet Take 1 tablet (400 mg total) by mouth daily. 05/01/13   Raquel Rey, NP  nitroGLYCERIN (NITROSTAT) 0.4 MG SL tablet Place 1 tablet (0.4 mg total) under the tongue every 5 (five) minutes as needed for chest pain. 12/26/12 12/26/13  Antonieta Iba, MD  omeprazole (PRILOSEC) 40 MG capsule Take 40 mg by mouth daily.    Historical Provider, MD  ondansetron (ZOFRAN) 4 MG tablet Take 1 tablet (4 mg total) by mouth every 8 (eight) hours as needed. 04/06/13   Sherlene Shams, MD  Polyethyl Glycol-Propyl Glycol (SYSTANE OP) Apply to eye. 1 drop to left eye 4 times daily for 2 days after monthly eye injection    Historical Provider, MD  pramipexole (MIRAPEX) 0.25 MG tablet Take 0.5 mg by mouth at bedtime and may repeat dose one time if needed. TAKE ONE TABLET BY MOUTH 3 TIMES DAILY FOR PARKINSONS. 03/21/13   Sherlene Shams, MD  predniSONE (DELTASONE) 10 MG tablet Start with 60 mg and taper by 5 mg daily until done. 05/01/13   Raquel Rey, NP  ranolazine (RANEXA) 500 MG 12 hr tablet Take 1 tablet (500 mg total) by mouth 2 (two) times daily. Take and additional dose twice daily as needed for chest pain 02/14/13   Sherlene Shams, MD  traMADol (ULTRAM) 50 MG tablet Take 1 tablet (50 mg total) by mouth every 8 (eight) hours as needed. 04/06/13   Sherlene Shams, MD   Physical Exam: Filed Vitals:   05/02/13 0420  BP:  105/55  Pulse: 103  Temp: 98.9 F (37.2 C)  TempSrc: Oral  Resp: 34  Height: 5' (1.524 m)  Weight: 86.8 kg (191 lb 5.8 oz)  SpO2: 100%     General:  Well-developed and nourished.  Eyes: Anicteric no pallor.  ENT: No discharge from ears eyes nose mouth.  Neck: No mass felt.  Cardiovascular: S1-S2 heard.  Respiratory: Bilateral expiratory wheeze heard no crepitations.  Abdomen: Soft nontender bowel sounds present.  Skin: No rash.  Musculoskeletal: No edema.  Psychiatric: Appears normal.  Neurologic: Alert awake oriented to time place and person. Moves all extremities.  Labs on Admission:  Basic Metabolic Panel: No results found for this basename: NA, K, CL, CO2, GLUCOSE, BUN, CREATININE, CALCIUM, MG, PHOS,  in the last 168 hours Liver Function Tests: No results found for this basename: AST, ALT, ALKPHOS, BILITOT, PROT, ALBUMIN,  in the last 168 hours No results found for this basename: LIPASE, AMYLASE,  in the last 168 hours No results found for this basename: AMMONIA,  in the last 168 hours CBC:  Recent Labs Lab 05/02/13 0538  WBC 13.0*  NEUTROABS 10.7*  HGB 10.8*  HCT 33.0*  MCV 89.4  PLT 201   Cardiac Enzymes: No results found for this basename: CKTOTAL, CKMB, CKMBINDEX, TROPONINI,  in the last 168 hours  BNP (last 3 results) No results found for this basename: PROBNP,  in the last 8760 hours CBG: No results found for this basename: GLUCAP,  in the last 168 hours  Radiological Exams on Admission: No results found.  EKG: Independently reviewed. EKG done at [facility] was showing LBBB.  Assessment/Plan Principal Problem:   Acute respiratory failure Active Problems:   Hypertension   Uncontrolled secondary diabetes mellitus with stage 4 CKD (GFR 15-29)   Chronic kidney disease (CKD), stage IV (severe)   CHF (congestive heart failure)   1. Acute respiratory failure, hypoxic - probably secondary to CHF and possible areas of bronchitis. Have ordered  a repeat chest x-ray. Continue with IV Lasix 40 mg daily along with vancomycin and cefepime for possible bronchitis/pneumonia. Closely follow intake output and metabolic panel. Since patient is wheezing have also place patient on IV steroids along with nebulizer and Pulmicort. 2. CAD with mildly elevated troponin - recheck troponin and cycle. Aspirin. Continue statins and beta blockers. Patient was felt to be not a candidate for cardiac intervention is due to chronic kidney disease as per the patient's cardiology notes. 3. Chronic kidney disease stage IV - creatinine appears to be at baseline. Closely follow intake output and metabolic panel. 4. Diabetes mellitus type 2 - closely follow CBGs as patient is on steroids. 5. Hypertension - continue present medications. 6. Mild anemia - follow CBC.  Patient's repeat chest x-ray and most of the labs are pending.    Code Status: Full code as discussed with patient.  Family Communication: None.  Disposition Plan: Admit to inpatient.    Rilie Glanz N. [facility] Pager [phone].  If 7PM-7AM, please contact night-coverage [email or web address] Password [email or web address] 05/02/2013, 6:07 AM

## 2013-05-02 NOTE — Progress Notes (Signed)
  Echocardiogram 2D Echocardiogram has been performed.  Georgian Co 05/02/2013, 3:23 PM

## 2013-05-02 NOTE — Progress Notes (Signed)
ANTICOAGULATION CONSULT NOTE Pharmacy Consult for Heparin Indication: NSTEMI  Allergies  Allergen Reactions  . Codeine     Nausea & vomiting  . Ezetimibe   . Hydrocodone Bitartrate   . Latex   . Levaquin [Levofloxacin] Nausea And Vomiting  . Naproxen     Patient Measurements: Height: 5' (152.4 cm) Weight: 191 lb 5.8 oz (86.8 kg) IBW/kg (Calculated) : 45.5 Heparin Dosing Weight: 66 kg  Vital Signs: Temp: 98.1 F (36.7 C) (10/07 2330) Temp src: Oral (10/07 2330) BP: 93/48 mmHg (10/07 2330) Pulse Rate: 86 (10/07 2330)  Labs:  Recent Labs  05/02/13 0538 05/02/13 1035 05/02/13 1418 05/02/13 1601 05/02/13 2229  HGB 10.8*  --   --   --   --   HCT 33.0*  --   --   --   --   PLT 201  --   --   --   --   HEPARINUNFRC  --   --  0.53  --  0.31  CREATININE 2.44*  --   --   --   --   CKTOTAL 517*  --   --   --   --   TROPONINI 16.83* 18.43*  --  >20.00*  --     Estimated Creatinine Clearance: 13.5 ml/min (by C-G formula based on Cr of 2.44).  Assessment: 79 y.o. female with NSTEMI for heparin Goal of Therapy:  Heparin level 0.3-0.7 units/ml Monitor platelets by anticoagulation protocol: Yes   Plan:  Continue Heparin at current rate  Follow-up am labs.  Geannie Risen, PharmD, BCPS   05/02/2013 11:45 PM

## 2013-05-02 NOTE — Progress Notes (Signed)
ANTICOAGULATION CONSULT NOTE - Follow Up Consult  Pharmacy Consult for Heparin Indication: NSTEMI  Allergies  Allergen Reactions  . Codeine     Nausea & vomiting  . Ezetimibe   . Hydrocodone Bitartrate   . Latex   . Levaquin [Levofloxacin] Nausea And Vomiting  . Naproxen     Patient Measurements: Height: 5' (152.4 cm) Weight: 191 lb 5.8 oz (86.8 kg) IBW/kg (Calculated) : 45.5 Heparin Dosing Weight: 66 kg  Vital Signs: Temp: 98.5 F (36.9 C) (10/07 1127) Temp src: Oral (10/07 1127) BP: 112/42 mmHg (10/07 1127) Pulse Rate: 103 (10/07 0420)  Labs:  Recent Labs  05/02/13 0538 05/02/13 1035 05/02/13 1418  HGB 10.8*  --   --   HCT 33.0*  --   --   PLT 201  --   --   HEPARINUNFRC  --   --  0.53  CREATININE 2.44*  --   --   CKTOTAL 517*  --   --   TROPONINI 16.83* 18.43*  --     Estimated Creatinine Clearance: 13.5 ml/min (by C-G formula based on Cr of 2.44).   Medications:  Heparin @ 750 units/hr (7.5 ml/hr)  Assessment: 89 YOF on heparin for NSTEMI with a therapeutic heparin level this afternoon (HL 0.53, goal of 0.3-0.7). Hgb/Hct slightly low this a.m, plts wnl -- no overt s/sx of bleeding noted.   Goal of Therapy:  Heparin level 0.3-0.7 units/ml Monitor platelets by anticoagulation protocol: Yes   Plan:  1. Continue heparin at 750 units/hr (7.5 ml/hr) 2. Will continue to monitor for any signs/symptoms of bleeding and will follow up with heparin level in 8 hours to confirm therapeutic  Georgina Pillion, PharmD, BCPS Clinical Pharmacist Pager: [phone] 05/02/2013 3:38 PM

## 2013-05-02 NOTE — Progress Notes (Signed)
CRITICAL VALUE ALERT  Critical value received:  Troponin 16.83  Date of notification:  05/02/2013  Time of notification:  0655  Critical value read back:yes  Nurse who received alert:  Maximino Greenland  MD notified (1st page):  NP Schorr  Time of first page:  [phone]  MD notified (2nd page):  Time of second page:  Responding MD:    Time MD responded:

## 2013-05-02 NOTE — Progress Notes (Signed)
Patient admitted early this morning as transfer from [facility]. She has a history of CAD, diabetes mellitus and CK-MB and was brought in in acute respiratory distress secondary to pulmonary edema and placed on BiPAP. Transferred from [place] to [facility] because of bed availability. BNP noted to be around 7000. Patient in step down unit. Initially troponin minimally elevated at 0.4, but early this morning, troponin return back markedly elevated at 16. Cardiology consulted.  Assessment and plan: NSTEMI  Acute pulmonary edema Acute respiratory failure Hyperlipidemia Chronic kidney disease stage III-4 Diabetes mellitus Hypertension ? COPD exacerbation  Appreciate cardiology help. Medically managing. Echocardiogram ordered and pending. Do not feel this is infectious so we'll discontinue antibiotics. Monitor blood sugars, and especially in setting of steroids. Spoke with patient's son who confirmed that she is a DO NOT RESUSCITATE. He stated that both he and the patient were adamant that if something does happen which was fatal, to let her go and not perform any life-saving heroic measures. Have changed patient's CODE STATUS. Continue in step down.

## 2013-05-02 NOTE — Progress Notes (Addendum)
ANTIBIOTIC CONSULT NOTE - INITIAL  Pharmacy Consult for cefepime,vancomycin and heparin Indication: rule out pneumonia, ACS  Allergies  Allergen Reactions  . Codeine     Nausea & vomiting  . Ezetimibe   . Hydrocodone Bitartrate   . Latex   . Levaquin [Levofloxacin] Nausea And Vomiting  . Naproxen     Patient Measurements: Height: 5' (152.4 cm) Weight: 191 lb 5.8 oz (86.8 kg) IBW/kg (Calculated) : 45.5 Heparin Dosing Weight : 60 kg   Vital Signs: Temp: 98.9 F (37.2 C) (10/07 0420) Temp src: Oral (10/07 0420) BP: 105/55 mmHg (10/07 0420) Pulse Rate: 103 (10/07 0420) Intake/Output from previous day:   Intake/Output from this shift:    Labs:  Recent Labs  05/02/13 0538  WBC 13.0*  HGB 10.8*  PLT 201   Estimated Creatinine Clearance: 15 ml/min (by C-G formula based on Cr of 2.2). No results found for this basename: VANCOTROUGH, VANCOPEAK, VANCORANDOM, GENTTROUGH, GENTPEAK, GENTRANDOM, TOBRATROUGH, TOBRAPEAK, TOBRARND, AMIKACINPEAK, AMIKACINTROU, AMIKACIN,  in the last 72 hours   Microbiology: No results found for this or any previous visit (from the past 720 hour(s)).  Medical History: Past Medical History  Diagnosis Date  . Diabetes mellitus, type 2   . Diabetic nephropathy   . GERD (gastroesophageal reflux disease)   . Essential hypertension, benign   . Coronary atherosclerosis of native coronary artery   . Venous stasis   . Osteoarthritis     with chronic back pain  . Osteopenia   . Urge incontinence     mild  . History of CVA (cerebrovascular accident) 10  . Vitamin D deficiency   . Hyperlipidemia   . Vertigo   . Restless leg syndrome   . Anemia   . Hiatal hernia     Medications:  Prescriptions prior to admission  Medication Sig Dispense Refill  . albuterol (PROVENTIL) (2.5 MG/3ML) 0.083% nebulizer solution Take 3 mLs (2.5 mg total) by nebulization every 6 (six) hours as needed for wheezing.  75 mL  12  . aspirin 81 MG tablet Take 81 mg by  mouth daily.      Marland Kitchen atorvastatin (LIPITOR) 20 MG tablet TAKE ONE TABLET BY MOUTH EACH DAY. (IMPROVES CHOLESTEROL)  30 tablet  5  . B-D ULTRAFINE III SHORT PEN 31G X 8 MM MISC daily.       . Calcium & Magnesium Carbonates (MYLANTA PO) Take by mouth as needed.      . ergocalciferol (VITAMIN D2) 50000 UNITS capsule Take 1 capsule (50,000 Units total) by mouth every 30 (thirty) days.  4 capsule  3  . famotidine (PEPCID) 20 MG tablet Take 20 mg by mouth 2 (two) times daily as needed.       . furosemide (LASIX) 40 MG tablet TAKE ONE TABLET BY MOUTH ONCE DAILY. (EDEMA/DIURETIC)  30 tablet  5  . guaiFENesin-dextromethorphan (ROBITUSSIN DM) 100-10 MG/5ML syrup Take 5 mLs by mouth 3 (three) times daily as needed for cough.  118 mL  0  . insulin glargine (LANTUS) 100 UNIT/ML injection Inject 0.25 mLs (25 Units total) into the skin at bedtime.  10 mL    . isosorbide mononitrate (IMDUR) 60 MG 24 hr tablet TAKE 1 & 1/2 TABLETS BY MOUTH EVERY DAY IN THE MORNING FOR HEART.  45 tablet  5  . metoprolol tartrate (LOPRESSOR) 25 MG tablet TAKE 1/2 TABLET BY MOUTH TWICE A DAY. (HIGH BLOOD PRESSURE)  30 tablet  6  . moxifloxacin (AVELOX) 400 MG tablet Take 1 tablet (  400 mg total) by mouth daily.  14 tablet  0  . nitroGLYCERIN (NITROSTAT) 0.4 MG SL tablet Place 1 tablet (0.4 mg total) under the tongue every 5 (five) minutes as needed for chest pain.  25 tablet  6  . omeprazole (PRILOSEC) 40 MG capsule Take 40 mg by mouth daily.      . ondansetron (ZOFRAN) 4 MG tablet Take 1 tablet (4 mg total) by mouth every 8 (eight) hours as needed.  30 tablet  5  . Polyethyl Glycol-Propyl Glycol (SYSTANE OP) Apply to eye. 1 drop to left eye 4 times daily for 2 days after monthly eye injection      . pramipexole (MIRAPEX) 0.25 MG tablet Take 0.5 mg by mouth at bedtime and may repeat dose one time if needed. TAKE ONE TABLET BY MOUTH 3 TIMES DAILY FOR PARKINSONS.      Marland Kitchen predniSONE (DELTASONE) 10 MG tablet Start with 60 mg and taper by 5 mg  daily until done.  39 tablet  0  . ranolazine (RANEXA) 500 MG 12 hr tablet Take 1 tablet (500 mg total) by mouth 2 (two) times daily. Take and additional dose twice daily as needed for chest pain  90 tablet  3  . traMADol (ULTRAM) 50 MG tablet Take 1 tablet (50 mg total) by mouth every 8 (eight) hours as needed.  30 tablet  5   Assessment: 79 yo lady admitted for broad spectrum antibiotics r/o PNA and heparin r/o ACS.  CrCl~20 ml/min  Vanc 1 gm given at [facility]  Goal of Therapy:  Vancomycin trough level 15-20 mcg/ml Heparin level 0.3-0.7 units/ml  Plan:  Cont vancomycin 1000 mg IV a24 hours Cefepime 1 gm IV q24 hours Heparin bolus 2500 units and drip at 750 units/hr. Check heparin level 8 hours after start and daily while on heparin.  Daily CBC.   Talbert Cage Poteet 05/02/2013,6:12 AM   Addendum 05/02/13 1345  SCr 2.44, est CrCl ~14 ml/min.  Change vancomycin to 1 gm IV q48h. Will f/u renal function, trough prn  Christoper Fabian, PharmD, BCPS Clinical pharmacist, pager [phone] 05/02/2013  1:44 PM

## 2013-05-02 NOTE — Consult Note (Addendum)
CARDIOLOGY CONSULT NOTE  Patient ID: Sharon [name] MRN: [id number], DOB/AGE: 1915-12-03   Admit date: 05/02/2013 Date of Consult: 05/02/2013  Primary Physician: Duncan Dull, MD Primary Cardiologist: Concha Se, MD   Pt. Profile  Sharon [name] is a 79 year old female with CAD (2/11 cath 40% dLM, 70% pLAD), Stage IV CKD, DMII, and HTN who initially presented to the ED at [facility] with worsening SOB.   Problem List  Past Medical History  Diagnosis Date  . Diabetes mellitus, type 2   . Diabetic nephropathy   . GERD (gastroesophageal reflux disease)   . Essential hypertension, benign   . Coronary atherosclerosis of native coronary artery     a. 2012 Cath: LM 40, LAD 70p, nonobs RCA dzs-->med managed;  b. 06/2011 Myoview: mod-large zone mod-sev rev defect of ant/antlat wall, EF 70%;  c. 01/2012 Echo: EF >55%, mod dil LA, mild TR.  Marland Kitchen Retroperitoneal bleed     a. in setting of plavix Rx in past.  . Osteoarthritis     with chronic back pain  . Osteopenia   . Urge incontinence     mild  . History of CVA (cerebrovascular accident) 63  . Vitamin D deficiency   . Hyperlipidemia   . Vertigo   . Restless leg syndrome   . Anemia   . Hiatal hernia   . Venous stasis     Past Surgical History  Procedure Laterality Date  . Bladder repair  1988  . Total abdominal hysterectomy  1980  . Neck surgery  1991  . Lumbar laminectomy    . Knee arthroscopy    . Spine surgery      lumbar laminectomy    Allergies  Allergies  Allergen Reactions  . Codeine     Nausea & vomiting  . Ezetimibe   . Hydrocodone Bitartrate   . Latex   . Levaquin [Levofloxacin] Nausea And Vomiting  . Naproxen    HPI   Sharon [name] is a 79 year old female with medically managed CAD (2/11 cath 40% dLM, 70% pLAD), Stage IV CKD, DMII, and HTN who is here with worsening SOB. One week ago she returned from [place] after visiting her daughter. Her daughter had symptoms of a LRTI while she was there  visiting. She initially felt fine upon returning home to her assisted living facility, but 3 days ago she began feeling SOB and having nasal congestion, wheezing, and a worsening cough. This SOB was worse than her baseline SOB with minimal exertion. She felt that she was more SOB than normal while moving around her apartment, and that she also felt some SOB even while resting in a chair. The SOB in her chair was new for her and concerning. She was placed on steroids and Avelox at her assisted living center. Her SOB, however, worsened over the next couple days and yesterday prompted her to present to her PCP. There she got an Albuterol neb treatment and a SL NTG. She was not having any CP at this time. She denies any CP with SOB episodes the past week other than some chest discomfort with coughing. She does have occasional CP at the assisted living center for which she will be given SL NTG, which resolves her pain. These episodes occur less than once per month, and she last had one several weeks ago. Of note, she normally does not sleep in bed in her apartment due to orthopnea, she sleeps in a chair instead. While visiting her daughter  the previous week, however, she was sleeping flat on a bed.   Upon returning to her apartment after seeing her PCP she continued to feel SOB and felt that it was not improving. She therefore was taken to the ED at [facility] 2/2 worsening SOB. There she had a chest XRay which apparently showed pulmonary edema. WBC ct was elevated above 15, she had a low grade fever, and was tachypenic with diffuse wheezes. She was given IV Lasix, placed on BiPAP and given IV antibiotics, and was eventually transferred to [facility]. Labs drawn at [facility] showed troponin 0.4 and BNP around 7000. EKG showed her chronic LBBB.   At [facility] a 2nd troponin was drawn which was elevated to 16.83, up from 0.4 at [facility]. Chest XRay repeated which showed no overt pulmonary edema or convincing  infiltrate, no pleural effusion or pneumothorax.    Inpatient Medications  . albuterol  2.5 mg Nebulization Q6H  . aspirin EC  325 mg Oral Daily  . atorvastatin  40 mg Oral q1800  . budesonide (PULMICORT) nebulizer solution  0.25 mg Nebulization BID  . [START ON 05/03/2013] ceFEPime (MAXIPIME) IV  1 g Intravenous Q24H  . [START ON 05/03/2013] furosemide  40 mg Intravenous Daily  . insulin aspart  0-9 Units Subcutaneous TID WC  . insulin glargine  25 Units Subcutaneous QHS  . ipratropium  0.5 mg Nebulization Q4H  . isosorbide mononitrate  30 mg Oral Daily  . methylPREDNISolone (SOLU-MEDROL) injection  40 mg Intravenous Q12H  . metoprolol tartrate  12.5 mg Oral BID  . nitroGLYCERIN  1 inch Topical Q6H  . pramipexole  0.5 mg Oral QHS  . ranolazine  500 mg Oral BID  . sodium chloride  3 mL Intravenous Q12H  . sodium chloride  3 mL Intravenous Q12H  . vancomycin  1,000 mg Intravenous Q24H   Family History Family History  Problem Relation Age of Onset  . Diabetes Mother     Social History History   Social History  . Marital Status: Widowed    Spouse Name: N/A    Number of Children: N/A  . Years of Education: N/A   Occupational History  . Not on file.   Social History Main Topics  . Smoking status: Never Smoker   . Smokeless tobacco: Never Used  . Alcohol Use: No  . Drug Use: No  . Sexual Activity: Not on file   Other Topics Concern  . Not on file   Social History Narrative  . No narrative on file    Review of Systems  General:  Mild fever at home. No chills or night sweats.  Cardiovascular:  No palpitations. Other symptoms as noted in above HPI.  Respiratory: As in above HPI.  Abdominal:   No nausea, vomiting, or diarrhea.  All other systems reviewed and are otherwise negative except as noted above.  Physical Exam  Blood pressure 112/42, pulse 103, temperature 98.5 F (36.9 C), temperature source Oral, resp. rate 34, height 5' (1.524 m), weight 191 lb 5.8 oz  (86.8 kg), SpO2 97.00%.  General: Pleasant, NAD Psych: Normal affect. Neuro: Alert and oriented X 3. Moves all extremities spontaneously.  Neck: Supple without bruits or JVD. Lungs:  Diffuse bilateral expiratory wheezes. No crackles heard.  Heart: RRR no s3, s4, or murmurs, though auscultation limited due to diffuse wheezes and pt's inability to hold her breath.  Abdomen: Soft, non-tender, non-distended, BS + x 4.  Extremities: 1+ bilateral pitting edema.  Labs   Recent Labs  05/02/13 0538 05/02/13 1035  CKTOTAL 517*  --   TROPONINI 16.83* 18.43*   Lab Results  Component Value Date   WBC 13.0* 05/02/2013   HGB 10.8* 05/02/2013   HCT 33.0* 05/02/2013   MCV 89.4 05/02/2013   PLT 201 05/02/2013     Recent Labs Lab 05/02/13 0538  NA 142  K 4.3  CL 102  CO2 30  BUN 32*  CREATININE 2.44*  CALCIUM 8.4  PROT 6.0  BILITOT 0.7  ALKPHOS 93  ALT 12  AST 51*  GLUCOSE 219*   Radiology/Studies  Dg Chest Port 1 View  05/02/2013   CLINICAL DATA:  Short of breath  EXAM: PORTABLE CHEST - 1 VIEW  COMPARISON:  None.  FINDINGS: Cardiac silhouette is normal in size. Mediastinum is normal in contour.  Medial lung base opacity is noted bilaterally, likely atelectasis. There is central interstitial thickening. Mild reticular opacities are noted in the left mid lung peripherally. No overt pulmonary edema or convincing infiltrate. No pleural effusion or pneumothorax is seen.  IMPRESSION: Central interstitial thickening with medial lung base atelectasis. No overt edema or convincing infiltrate.   Electronically Signed   By: Amie Portland M.D.   On: 05/02/2013 07:47   ECG  NSR, HR 98 bpm, with chronic LBBB. ST elevations in lead III, V2, V3 which are unchanged from previous EKG on 03/28/13.   ASSESSMENT AND PLAN  Sharon [name] is a 79 year old female with known CAD who transferred from [place] to [facility] with acute SOB. Chest XRay does not show any pulmonary edema and patient is on steroids  and antibiotics. 2nd troponin drawn was elevated over 16, though patient does not recall any CP other than with coughing.  1. CAD/NSTEMI - The patient has known CAD with a 08/2009 cath showing a 40% dLM and 70% pLAD located at the location where 2 diagonals branch off. Stress MPI 06/2011 showed moderate-severe anterior/anterolateral reversible ischemia, in line with what her cath. Decision was made at that time to treat her medically due to age and co-morbidities. Echo 01/2012 which showed EF 55% with mild concentric LVH. She has occasional CP for which she requires SL NTG approx one time per month. She has not experienced any CP over the past week other than with coughing. EKG chronic LBBB which is unchanged from previous EKGs. Trops 0.4 --> 16.83. Current presentation most likely consistent with subendocardial/demand ischemia in the setting of acute resp failure and hypoxia with recent resp illness/wheezing/cough, etc.  Given co-morbidities, will continue conservative therapy with asa, bb, statin, nitrate.  Will check echo to assess LV fxn (prev nl).  No P2Y12 inhibitor 2/2 prior h/o spont RP bleed while on plavix in the past.    2.   Acute Resp Failure/URI:  Abx, inhalers per IM.  Rec addition of steroids, despite #1.  No significant volume overload.  Checking echo.  3.  CKD III-IV:  Limits our aggressiveness, as it has in the past.  Poor cath candidate.  Follow on IV lasix.  4.  DM:  Per IM.  Expect higher sugars on steroids.  5.  HL:  Cont  Statin.  6.  HTN:  Stable.  Signed, Nicolasa Ducking, NP 05/02/2013, 1:12 PM    The patient was seen, examined and discussed with Ward Givens, PA-C and I agree with the above.  In summary, this is a very delightful 78 year old female with known CAD who presented with worsening  SOB and was found to have NSTEMI. On the last cardiac catheterization in 2011 she had 40 stenosis of the distal left main and 70 % LAD stenosis. She had a pharmacologic nuclear  stress test performed in December 2012 with findings of medium to large of moderate to severe ischemia in the anterior and anterolateral walls. The decision was to maximize conservative antianginal therapy because of CKD stage IV. The patient was asymptomatic until now. Her troponins are still uptrending the last one being 18. We will continue to monitor. Unfortunately, at this moment, due to chronic kidney disease we will continue conservative management. Meanwhile we will treat for COPD exacerbation (the patient is actively wheezing) with steroids and nebulizer treatments. The patient's prior echo showed normal EF without wall motion abnormalities, we will repeat today. There are no signs of fluid overload, we will continue just home dose of Lasix. Increase Lipitor dose to 40 mg PO QHS, continue Metoprolol and aspirin 325 mg PO QD.  Tobias Alexander, H 05/02/2013

## 2013-05-02 NOTE — Progress Notes (Signed)
Utilization review completed.  

## 2013-05-02 NOTE — Progress Notes (Signed)
Pt without measurable void since 11am. Bladder scanned showed <51ml. Notified MD new orders given. Will carry out and continue to monitor for changes.

## 2013-05-02 NOTE — Progress Notes (Signed)
Pt c/o foley discomfort. Able to let staff know when needs bedpan. Notified MD, removed foley-noted blood tinged bedpad and dark bloody urine in foley on removal. Foley placed at [facility] prior to tx to [facility]. Will continue to monitor.

## 2013-05-03 ENCOUNTER — Telehealth: Payer: Self-pay | Admitting: *Deleted

## 2013-05-03 LAB — BASIC METABOLIC PANEL
BUN: 53 mg/dL — ABNORMAL HIGH (ref 6–23)
CO2: 25 mEq/L (ref 19–32)
Creatinine, Ser: 3.14 mg/dL — ABNORMAL HIGH (ref 0.50–1.10)
GFR calc non Af Amer: 12 mL/min — ABNORMAL LOW (ref >90)
Glucose, Bld: 294 mg/dL — ABNORMAL HIGH (ref 70–99)
Potassium: 4.3 mEq/L (ref 3.5–5.1)

## 2013-05-03 LAB — HEPARIN LEVEL (UNFRACTIONATED)
Heparin Unfractionated: 0.22 IU/mL — ABNORMAL LOW (ref 0.30–0.70)
Heparin Unfractionated: 0.28 IU/mL — ABNORMAL LOW (ref 0.30–0.70)

## 2013-05-03 LAB — CBC
HCT: 28.4 % — ABNORMAL LOW (ref 36.0–46.0)
Hemoglobin: 9.4 g/dL — ABNORMAL LOW (ref 12.0–15.0)
MCH: 29.6 pg (ref 26.0–34.0)
MCHC: 33.1 g/dL (ref 30.0–36.0)
MCV: 89.3 fL (ref 78.0–100.0)
RDW: 15 % (ref 11.5–15.5)

## 2013-05-03 LAB — GLUCOSE, CAPILLARY
Glucose-Capillary: 242 mg/dL — ABNORMAL HIGH (ref 70–99)
Glucose-Capillary: 262 mg/dL — ABNORMAL HIGH (ref 70–99)
Glucose-Capillary: 241 mg/dL — ABNORMAL HIGH (ref 70–99)

## 2013-05-03 MED ORDER — SODIUM CHLORIDE 0.9 % IV BOLUS (SEPSIS)
250.0000 mL | Freq: Once | INTRAVENOUS | Status: AC
  Administered 2013-05-03: 250 mL via INTRAVENOUS

## 2013-05-03 MED ORDER — SODIUM CHLORIDE 0.9 % IV BOLUS (SEPSIS): 250.0000 mL | Freq: Once | INTRAVENOUS | Status: AC

## 2013-05-03 MED ORDER — INSULIN ASPART 100 UNIT/ML ~~LOC~~ SOLN
0.0000 [IU] | Freq: Three times a day (TID) | SUBCUTANEOUS | Status: DC
  Administered 2013-05-03: 17:00:00 11 [IU] via SUBCUTANEOUS
  Administered 2013-05-04 (×2): 5 [IU] via SUBCUTANEOUS
  Administered 2013-05-04: 07:00:00 3 [IU] via SUBCUTANEOUS
  Administered 2013-05-05: 17:00:00 5 [IU] via SUBCUTANEOUS
  Administered 2013-05-05: 11:00:00 8 [IU] via SUBCUTANEOUS
  Administered 2013-05-05 – 2013-05-06 (×2): 3 [IU] via SUBCUTANEOUS
  Administered 2013-05-06: 17:00:00 via SUBCUTANEOUS
  Administered 2013-05-06: 5 [IU] via SUBCUTANEOUS
  Administered 2013-05-07: 3 [IU] via SUBCUTANEOUS
  Administered 2013-05-07: 5 [IU] via SUBCUTANEOUS
  Administered 2013-05-07: 18:00:00 8 [IU] via SUBCUTANEOUS
  Administered 2013-05-08: 12:00:00 3 [IU] via SUBCUTANEOUS

## 2013-05-03 MED ORDER — INSULIN ASPART 100 UNIT/ML ~~LOC~~ SOLN
0.0000 [IU] | Freq: Every day | SUBCUTANEOUS | Status: DC
Start: 2013-05-03 — End: 2013-05-08
  Administered 2013-05-03 – 2013-05-07 (×5): 2 [IU] via SUBCUTANEOUS

## 2013-05-03 MED ORDER — ASPIRIN EC 81 MG PO TBEC
81.0000 mg | DELAYED_RELEASE_TABLET | Freq: Every day | ORAL | Status: DC
  Administered 2013-05-03 – 2013-05-08 (×6): 81 mg via ORAL
  Filled 2013-05-03 (×6): qty 1

## 2013-05-03 MED ORDER — FUROSEMIDE 20 MG PO TABS
20.0000 mg | ORAL_TABLET | Freq: Two times a day (BID) | ORAL | Status: DC
  Administered 2013-05-03: 20 mg via ORAL
  Filled 2013-05-03 (×5): qty 1

## 2013-05-03 NOTE — Telephone Encounter (Signed)
Refill Request  Furosemide 40 mg tablet   #30 tab  Take one tablet by mouth once daily

## 2013-05-03 NOTE — Progress Notes (Signed)
[facility] PROGRESS NOTE  Sharon [name] ZOX:[id number] DOB: 02-14-1916 DOA: 05/02/2013 PCP: Duncan Dull, MD  Assessment/Plan: Principal Problem:   NSTEMI (non-ST elevated myocardial infarction): Appreciate cardiology help. Medically managed. Continue nitroglycerin and heparin. Troponins trending up Active Problems:   Coronary artery disease, occlusive   Hypertension: Blood pressure stable. Continue beta blocker   Uncontrolled secondary diabetes mellitus with stage 4 CKD (GFR 15-29): Increase sliding scale, continue Lantus.   Chronic kidney disease (CKD), stage IV (severe)   GERD (gastroesophageal reflux disease)   Acute respiratory failure   CHF (congestive heart failure) versus COPD: Some measure likely both. Continue nebulizers and steroids. Gave some additional IV fluids yesterday given scanty urine output. Patient slightly wet today so we'll give 1 dose of oral Lasix. Continue to follow labs.   Code Status: DO NOT RESUSCITATE as confirmed by some Family Communication: Spoke with son Disposition Plan: Improved, being transferred to telemetry bed   Consultants:  [facility]  Procedures:  Echocardiogram done 10/7: Grade 1 diastolic dysfunction, ejection fraction 35-40%  Antibiotics:  Vancomycin and Zosyn 10/6-10/7  HPI/Subjective: Patient feeling markedly better. Denies any pain. This bleeding may be a little bit more heavier this morning  Objective: Filed Vitals:   05/03/13 1116  BP:   Pulse:   Temp: 97.6 F (36.4 C)  Resp:     Intake/Output Summary (Last 24 hours) at 05/03/13 1257 Last data filed at 05/03/13 0829  Gross per 24 hour  Intake 1488.42 ml  Output      0 ml  Net 1488.42 ml   Filed Weights   05/02/13 0420 05/03/13 0227  Weight: 86.8 kg (191 lb 5.8 oz) 87.7 kg (193 lb 5.5 oz)    Exam:   General:  Alert and oriented x3, no acute distress  Cardiovascular: Regular rate and rhythm, S1-S2, 2/6 systolic ejection  murmur  Respiratory: , Mild bilateral end expiratory wheezing  Abdomen: Soft, obese, nontender, positive bowel sounds  Musculoskeletal: No clubbing or cyanosis, trace pitting edema   Data Reviewed: Basic Metabolic Panel:  Recent Labs Lab 05/02/13 0538 05/03/13 0525  NA 142 140  K 4.3 4.3  CL 102 103  CO2 30 25  GLUCOSE 219* 294*  BUN 32* 53*  CREATININE 2.44* 3.14*  CALCIUM 8.4 7.7*   Liver Function Tests:  Recent Labs Lab 05/02/13 0538  AST 51*  ALT 12  ALKPHOS 93  BILITOT 0.7  PROT 6.0  ALBUMIN 2.7*   No results found for this basename: LIPASE, AMYLASE,  in the last 168 hours No results found for this basename: AMMONIA,  in the last 168 hours CBC:  Recent Labs Lab 05/02/13 0538 05/03/13 0525  WBC 13.0* 9.3  NEUTROABS 10.7*  --   HGB 10.8* 9.4*  HCT 33.0* 28.4*  MCV 89.4 89.3  PLT 201 178   Cardiac Enzymes:  Recent Labs Lab 05/02/13 0538 05/02/13 1035 05/02/13 1601  CKTOTAL 517*  --   --   TROPONINI 16.83* 18.43* >20.00*   BNP (last 3 results) No results found for this basename: PROBNP,  in the last 8760 hours CBG:  Recent Labs Lab 05/02/13 1231 05/02/13 1518 05/02/13 1711 05/02/13 2111 05/03/13 0825  GLUCAP 146* 106* 114* 165* 242*    Recent Results (from the past 240 hour(s))  MRSA PCR SCREENING     Status: None   Collection Time    05/02/13  4:59 AM      Result Value Range Status   MRSA by PCR NEGATIVE  NEGATIVE  Final   Comment:            The GeneXpert MRSA Assay (FDA     approved for NASAL specimens     only), is one component of a     comprehensive MRSA colonization     surveillance program. It is not     intended to diagnose MRSA     infection nor to guide or     monitor treatment for     MRSA infections.     Studies: Dg Chest Port 1 View  05/02/2013   CLINICAL DATA:  Short of breath  EXAM: PORTABLE CHEST - 1 VIEW  COMPARISON:  None.  FINDINGS: Cardiac silhouette is normal in size. Mediastinum is normal in contour.   Medial lung base opacity is noted bilaterally, likely atelectasis. There is central interstitial thickening. Mild reticular opacities are noted in the left mid lung peripherally. No overt pulmonary edema or convincing infiltrate. No pleural effusion or pneumothorax is seen.  IMPRESSION: Central interstitial thickening with medial lung base atelectasis. No overt edema or convincing infiltrate.   Electronically Signed   By: Amie Portland M.D.   On: 05/02/2013 07:47    Scheduled Meds: . albuterol  2.5 mg Nebulization Q6H  . aspirin EC  81 mg Oral Daily  . atorvastatin  40 mg Oral q1800  . budesonide (PULMICORT) nebulizer solution  0.25 mg Nebulization BID  . furosemide  20 mg Oral BID  . insulin aspart  0-15 Units Subcutaneous TID WC  . insulin aspart  0-5 Units Subcutaneous QHS  . insulin glargine  25 Units Subcutaneous QHS  . ipratropium  0.5 mg Nebulization Q6H  . isosorbide mononitrate  30 mg Oral Daily  . methylPREDNISolone (SOLU-MEDROL) injection  40 mg Intravenous Q12H  . metoprolol tartrate  12.5 mg Oral BID  . pramipexole  0.5 mg Oral QHS  . ranolazine  500 mg Oral BID  . sodium chloride  3 mL Intravenous Q12H  . sodium chloride  3 mL Intravenous Q12H   Continuous Infusions: . heparin 900 Units/hr (05/03/13 0829)    Principal Problem:   NSTEMI (non-ST elevated myocardial infarction) Active Problems:   Coronary artery disease, occlusive   Hypertension   Uncontrolled secondary diabetes mellitus with stage 4 CKD (GFR 15-29)   Chronic kidney disease (CKD), stage IV (severe)   GERD (gastroesophageal reflux disease)   Acute respiratory failure   CHF (congestive heart failure)    Time spent: 25 minutes    Sharon [name]  [facility] Pager [phone]. If 7PM-7AM, please contact night-coverage at [email or web address], password [facility] 05/03/2013, 12:57 PM  LOS: 1 day

## 2013-05-03 NOTE — Progress Notes (Signed)
ANTICOAGULATION CONSULT NOTE Pharmacy Consult for Heparin Indication: NSTEMI  Allergies  Allergen Reactions  . Codeine     Nausea & vomiting  . Ezetimibe   . Hydrocodone Bitartrate   . Latex   . Levaquin [Levofloxacin] Nausea And Vomiting  . Naproxen     Patient Measurements: Height: 5' (152.4 cm) Weight: 193 lb 5.5 oz (87.7 kg) IBW/kg (Calculated) : 45.5 Heparin Dosing Weight: 66 kg  Vital Signs: Temp: 98.5 F (36.9 C) (10/08 0320) Temp src: Oral (10/08 0320) BP: 121/41 mmHg (10/08 0600) Pulse Rate: 75 (10/08 0600)  Labs:  Recent Labs  05/02/13 0538 05/02/13 1035 05/02/13 1418 05/02/13 1601 05/02/13 2229 05/03/13 0525  HGB 10.8*  --   --   --   --  9.4*  HCT 33.0*  --   --   --   --  28.4*  PLT 201  --   --   --   --  178  HEPARINUNFRC  --   --  0.53  --  0.31 0.22*  CREATININE 2.44*  --   --   --   --   --   CKTOTAL 517*  --   --   --   --   --   TROPONINI 16.83* 18.43*  --  >20.00*  --   --     Estimated Creatinine Clearance: 13.6 ml/min (by C-G formula based on Cr of 2.44).  Assessment: 79 y.o. female with NSTEMI for heparin Goal of Therapy:  Heparin level 0.3-0.7 units/ml Monitor platelets by anticoagulation protocol: Yes   Plan:  Increase Heparin 900 units/hr  Geannie Risen, PharmD, BCPS  05/03/2013 6:09 AM

## 2013-05-03 NOTE — Progress Notes (Signed)
    I have seen and examined the patient. I agree with the above note with the addition of : no chest pain. Still with dyspnea and significant wheezing by exam.  TnI trended >20. Echo with new systolic dysfunction. Thus, this was likely a true MI . Due to age and co morbidities, I agree with medical therapy.  Heparin can be stopped after 48 hours of treatment. Continue PO Lasix and monitor renal function.   Lorine Bears MD, [facility] 05/03/2013 4:15 PM

## 2013-05-03 NOTE — Progress Notes (Signed)
Heparin drip running at 91ml/hr. Rate increased to 10.57ml/hr. Cosigned and verified by Jasper Riling, RN.

## 2013-05-03 NOTE — Progress Notes (Signed)
Inpatient Diabetes Program Recommendations  AACE/ADA: New Consensus Statement on Inpatient Glycemic Control (2013)  Target Ranges:  Prepandial:   less than 140 mg/dL      Peak postprandial:   less than 180 mg/dL (1-2 hours)      Critically ill patients:  140 - 180 mg/dL  Results for SELENE, PELTZER (MRN [id number]) as of 05/03/2013 12:39  Ref. Range 05/02/2013 12:31 05/02/2013 15:18 05/02/2013 17:11 05/02/2013 21:11 05/03/2013 08:25  Glucose-Capillary Latest Range: 70-99 mg/dL 409 (H) 811 (H) 914 (H) 165 (H) 242 (H)   Inpatient Diabetes Program Recommendations Correction (SSI): increase to resistant during steroid therapy Thank you  Piedad Climes BSN, RN,CDE Inpatient Diabetes Coordinator [phone] (team pager)

## 2013-05-03 NOTE — Progress Notes (Signed)
Pt tx 4E per Md order, pt VSS, pt c/o wheezing, MD aware no change from admission baseline, pt verbalized understanding of tx, report called to RN, all questions answered

## 2013-05-03 NOTE — Progress Notes (Signed)
Pt arrived to floor. Patient placed on monitor and CCMD notified. VS stable. Patient alert and oriented, in no distress. Patient has no complaints at this time. Will continue to monitor.

## 2013-05-03 NOTE — Clinical Social Work Note (Signed)
3S/6N CSW met with pt at bedside on 3S. Pt stated that prior to admission at [facility], pt was living 100% independently at [facility] in [place]. Pt stated that once she is medically discharged she would like to return to her home at [facility]. CSW notified 4E CSW of information above. CSW signing off due to pt returning to independent living.  Please re consult CSW if discharge disposition changes.  Darlyn Chamber, MSW, LCSWA Clinical Social Work [phone]

## 2013-05-03 NOTE — Progress Notes (Addendum)
Patient Name: Sharon [name] Date of Encounter: 05/03/2013   Principal Problem:   NSTEMI (non-ST elevated myocardial infarction) Active Problems:   Coronary artery disease, occlusive   Hypertension   Uncontrolled secondary diabetes mellitus with stage 4 CKD (GFR 15-29)   Chronic kidney disease (CKD), stage IV (severe)   GERD (gastroesophageal reflux disease)   Acute respiratory failure   CHF (congestive heart failure)    SUBJECTIVE  Sharon [name] slept well last night. Has had no CP over previous 24 hours other than with coughing. She feels her breathing has improved and that she feels 'much better' than she did at [employer] IM team had conversation with patient and son yest confirming that they wish for her to be DNR.   CURRENT MEDS . albuterol  2.5 mg Nebulization Q6H  . aspirin EC  325 mg Oral Daily  . atorvastatin  40 mg Oral q1800  . budesonide (PULMICORT) nebulizer solution  0.25 mg Nebulization BID  . furosemide  40 mg Intravenous Daily  . insulin aspart  0-9 Units Subcutaneous TID WC  . insulin glargine  25 Units Subcutaneous QHS  . ipratropium  0.5 mg Nebulization Q6H  . isosorbide mononitrate  30 mg Oral Daily  . methylPREDNISolone (SOLU-MEDROL) injection  40 mg Intravenous Q12H  . metoprolol tartrate  12.5 mg Oral BID  . nitroGLYCERIN  1 inch Topical Q6H  . pramipexole  0.5 mg Oral QHS  . ranolazine  500 mg Oral BID  . sodium chloride  3 mL Intravenous Q12H  . sodium chloride  3 mL Intravenous Q12H    OBJECTIVE  Filed Vitals:   05/03/13 0227 05/03/13 0320 05/03/13 0400 05/03/13 0600  BP:  104/38 114/41 121/41  Pulse:  84 83 75  Temp:  98.5 F (36.9 C)    TempSrc:  Oral    Resp:  20 22 17   Height:      Weight: 193 lb 5.5 oz (87.7 kg)     SpO2: 95% 96% 98% 97%    Intake/Output Summary (Last 24 hours) at 05/03/13 0700 Last data filed at 05/03/13 0600  Gross per 24 hour  Intake    983 ml  Output    175 ml  Net    808 ml   Filed Weights   05/02/13 0420 05/03/13 0227  Weight: 191 lb 5.8 oz (86.8 kg) 193 lb 5.5 oz (87.7 kg)    PHYSICAL EXAM  General: Pleasant, NAD. Neuro: Alert and oriented X 3. Moves all extremities spontaneously. Psych: Normal affect. Neck: Supple without bruits or JVD. Lungs:  Diffuse bilateral expiratory wheezes. No crackles heard.  Heart: RRR no s3, s4, or murmurs, though auscultation limited due to diffuse wheezes and pt's inability to hold her breath.  Abdomen: Soft, non-tender, non-distended, BS + x 4.  Extremities: Diffuse bilateral expiratory wheezes. No crackles heard.   Accessory Clinical Findings  CBC  Recent Labs  05/02/13 0538 05/03/13 0525  WBC 13.0* 9.3  NEUTROABS 10.7*  --   HGB 10.8* 9.4*  HCT 33.0* 28.4*  MCV 89.4 89.3  PLT 201 178   Basic Metabolic Panel  Recent Labs  05/02/13 0538 05/03/13 0525  NA 142 140  K 4.3 4.3  CL 102 103  CO2 30 25  GLUCOSE 219* 294*  BUN 32* 53*  CREATININE 2.44* 3.14*  CALCIUM 8.4 7.7*   Liver Function Tests  Recent Labs  05/02/13 0538  AST 51*  ALT 12  ALKPHOS 93  BILITOT 0.7  PROT  6.0  ALBUMIN 2.7*   Cardiac Enzymes  Recent Labs  05/02/13 0538 05/02/13 1035 05/02/13 1601  CKTOTAL 517*  --   --   TROPONINI 16.83* 18.43* >20.00*   Thyroid Function Tests  Recent Labs  05/02/13 0538  TSH 1.524   _____________   Delton See  NSR with chronic LBBB. HRs have been WNL.   Radiology/Studies  2D Echocardiogram 10.7.2014  - Left ventricle: The cavity size was normal. Wall thickness   was increased in a pattern of mild LVH. Systolic function   was moderately reduced. The estimated ejection fraction   was in the range of 35% to 40%. Diffuse hypokinesis.   Doppler parameters are consistent with abnormal left   ventricular relaxation (grade 1 diastolic dysfunction). - Ventricular septum: Septal motion showed paradox. - Mitral valve: Calcified annulus. - Left atrium: The atrium was mildly dilated. - Pulmonary  arteries: PA peak pressure: 39mm Hg (S).   Dg Chest Port 1 View  05/02/2013   CLINICAL DATA:  Short of breath  EXAM: PORTABLE CHEST - 1 VIEW  COMPARISON:  None.  FINDINGS: Cardiac silhouette is normal in size. Mediastinum is normal in contour.  Medial lung base opacity is noted bilaterally, likely atelectasis. There is central interstitial thickening. Mild reticular opacities are noted in the left mid lung peripherally. No overt pulmonary edema or convincing infiltrate. No pleural effusion or pneumothorax is seen.  IMPRESSION: Central interstitial thickening with medial lung base atelectasis. No overt edema or convincing infiltrate.   Electronically Signed   By: Amie Portland M.D.   On: 05/02/2013 07:47    ASSESSMENT AND PLAN  Sharon [name] is a 79 year old female with known CAD who presented with acute resp failure and hypoxia and was found to have a NSTEMI.   1. NSTEMI:  Note from IM stating that conversations with son and patient yest confirmed that they wish for patient to be DNR. Trops continue to trend up. 16.83 --> 18.43 --> 20.00 this AM. Continue to trend.  She has not had any CP in past 24 hours other than discomfort with coughing. Echo done yest showed EF 35-40% with diffuse hypokinesis. This is a new finding from prev echo on 7/13 with EF 55%. Monitor shows sinus, chronic LBBB with HRs <100.  As patient is not a candidate for intervention, we will continue with conservative therapy regimen of Imdur 30mg , Metoprolol 12.5mg  BID, Atorvastatin 40mg , ASA 325 mg, Ranexa 500mg  BID.   2.  ICM:  New finding of EF 35-40%, glob HK.  Has been receiving 40mg  IV Lasix QD, though SCr has been trending up, rec D/C IV Lasix, see #3. Cont bb.  Will plan to consolidate to toprol xl prior to d/c.  Will not change to coreg 2/2 active wheezing.  No ACEI/ARB 2/2 acute on chronic renal failure.  2. Acute resp failure:  She feels moderately better this AM in regards to breathing, though she states she is much  better than she was a couple days ago. Still diffuse bilateral wheezes but these are sign improved from yest. Currently receiving albuterol 2.5mg  nebs Q6, Pulmicort 0.25mg  nebs BID, Atrovent 0.5mg  nebs Q6, and Solu-Medrol 40mg  IV BID.  Antibiotics were D/C by IM as acute resp failure/hypoxia not felt to be from infectious cause.   3. Acute on CKD IV:  Baseline SCr 2.2. This hospitalization 2.44 --> 3.14. As most recent CXR showed no signs of pulm edema and BPs and HRs have been WNL rec D/C IV Lasix  2/2 increasing SCr. Rec repeat CXR tomo morning to ensure no pulm edema after D/C Lasix. Cont to monitor Is&Os and daily weights. Peripheral edema present but unchanged from yest. Recorded UOP up to this point only 175 mL, though patient states she feels she is leaking urine with all her coughing.   4. DM:  BG readings of 219 and 294. Elevated in setting of steroids. Lantus nightly and Novolog SS has been added.   5. HL:  Increased Atorvastatin dose yest from 20 to 40mg  2/2 NSTEMI.   6. HTN:  Stable. On meds in #1.   Signed, Raelyn Ensign, [employer] student  Addendum:  Pt seen and examined with Mr. Agustin Cree and records extensively reviewed.  Though her breathing was somewhat better earlier this AM, she now has diff rhonchi and insp/exp wheezing following neb treatment.  She feels like she cannot clear her secretions.  No chest pain.  Exam - pleasant, nad, aaox3, neck obese but no obvious jvd, lungs scatt rhonchi, insp/exp wheezing, easily audible w/o stethoscope.  Cor distant rrr, abd soft, nt/nd/bs + 4, Ext trace edema bilat. A/P: 1. NSTEMI/CAD:  Suspect type II nstemi in setting of acute resp illness.  Difficult situation given known prior surgical/intervenable disease but ongoing CKD (acutely worse) and prior h/o RP bleed on plavix.  She is having no chest pain.  Troponin has risen to > 20.  Echo yesterday showed newly reduced LV fxn, EF 35-40%.  Cont conservative Rx with asa, bb, statin, nitrate,  ranolazine.  Long discussion with patient and son who are in full agreement with that approach. 2. ICM:  New.  Volume somewhat difficult to ascertain given body habitus but suspect that she is close to euvolemic.  D/c lasix and follow renal fxn.  Cont bb with plan to change to XL prior to d/c.  No acei/arb 2/2 acute on chronic kidney dzs. 3.  Acute resp failure:  abx d/c'd per IM.  WBC coming down.  Still with significant wheezing and rhonchi.  Cont nebs/steroids. 4.  Acute on chronic stage IV CKD:  BUN/Creat rising while receiving IV lasix.  D/c.  Follow.  No ACEI/ARB. 5.  DM:  Sugars up in setting of steroids.  Per IM.  Nicolasa Ducking 9:56 AM 05/03/2013

## 2013-05-03 NOTE — Progress Notes (Signed)
BP 93/48 Nitro due at 0000. Schorr notified, instructed to hold nitro and give 250cc bolus. Will reassess and give next dose at 0600. Schorr also notified of no urine output, bladder scan showed 57cc. Will continue to monitor. [place], [place], Charity fundraiser

## 2013-05-03 NOTE — Progress Notes (Signed)
ANTICOAGULATION CONSULT NOTE - Follow Up Consult  Pharmacy Consult for heparin Indication: NSTEMI  Allergies  Allergen Reactions  . Codeine     Nausea & vomiting  . Ezetimibe   . Hydrocodone Bitartrate   . Latex   . Levaquin [Levofloxacin] Nausea And Vomiting  . Naproxen     Patient Measurements: Height: 5' (152.4 cm) Weight: 193 lb 5.5 oz (87.7 kg) IBW/kg (Calculated) : 45.5 Heparin Dosing Weight: 66 kg   Vital Signs: Temp: 97.6 F (36.4 C) (10/08 1116) Temp src: Oral (10/08 1116) BP: 122/50 mmHg (10/08 0800) Pulse Rate: 80 (10/08 0800)  Labs:  Recent Labs  05/02/13 0538 05/02/13 1035  05/02/13 1601 05/02/13 2229 05/03/13 0525 05/03/13 1400  HGB 10.8*  --   --   --   --  9.4*  --   HCT 33.0*  --   --   --   --  28.4*  --   PLT 201  --   --   --   --  178  --   HEPARINUNFRC  --   --   < >  --  0.31 0.22* 0.28*  CREATININE 2.44*  --   --   --   --  3.14*  --   CKTOTAL 517*  --   --   --   --   --   --   TROPONINI 16.83* 18.43*  --  >20.00*  --   --   --   < > = values in this interval not displayed.  Estimated Creatinine Clearance: 10.6 ml/min (by C-G formula based on Cr of 3.14).   Medications:  Scheduled:  . albuterol  2.5 mg Nebulization Q6H  . aspirin EC  81 mg Oral Daily  . atorvastatin  40 mg Oral q1800  . budesonide (PULMICORT) nebulizer solution  0.25 mg Nebulization BID  . furosemide  20 mg Oral BID  . insulin aspart  0-15 Units Subcutaneous TID WC  . insulin aspart  0-5 Units Subcutaneous QHS  . insulin glargine  25 Units Subcutaneous QHS  . ipratropium  0.5 mg Nebulization Q6H  . isosorbide mononitrate  30 mg Oral Daily  . methylPREDNISolone (SOLU-MEDROL) injection  40 mg Intravenous Q12H  . metoprolol tartrate  12.5 mg Oral BID  . pramipexole  0.5 mg Oral QHS  . ranolazine  500 mg Oral BID  . sodium chloride  3 mL Intravenous Q12H  . sodium chloride  3 mL Intravenous Q12H   Infusions:  . heparin 900 Units/hr (05/03/13 4782)     Assessment: 79 yo female with NSTEMI is currently on subtherapeutic heparin.  Heparin level was 0.28. Goal of Therapy:  Heparin level 0.3-0.7 units/ml Monitor platelets by anticoagulation protocol: Yes   Plan:  1) Increase heparin to 1050 units/hr. No bolus 2) 8hr heparin level   Caster Fayette, Tsz-Yin 05/03/2013,4:14 PM

## 2013-05-04 ENCOUNTER — Ambulatory Visit: Payer: MEDICARE | Admitting: Adult Health

## 2013-05-04 LAB — GLUCOSE, CAPILLARY
Glucose-Capillary: 197 mg/dL — ABNORMAL HIGH (ref 70–99)
Glucose-Capillary: 224 mg/dL — ABNORMAL HIGH (ref 70–99)

## 2013-05-04 LAB — HEPARIN LEVEL (UNFRACTIONATED): Heparin Unfractionated: 0.45 IU/mL (ref 0.30–0.70)

## 2013-05-04 LAB — CBC
HCT: 29.4 % — ABNORMAL LOW (ref 36.0–46.0)
Hemoglobin: 10 g/dL — ABNORMAL LOW (ref 12.0–15.0)
MCH: 30.2 pg (ref 26.0–34.0)
MCHC: 34 g/dL (ref 30.0–36.0)
MCV: 88.8 fL (ref 78.0–100.0)
WBC: 12.4 10*3/uL — ABNORMAL HIGH (ref 4.0–10.5)

## 2013-05-04 LAB — BASIC METABOLIC PANEL
BUN: 76 mg/dL — ABNORMAL HIGH (ref 6–23)
CO2: 22 mEq/L (ref 19–32)
Calcium: 7.8 mg/dL — ABNORMAL LOW (ref 8.4–10.5)
Chloride: 100 mEq/L (ref 96–112)
Creatinine, Ser: 3.46 mg/dL — ABNORMAL HIGH (ref 0.50–1.10)
GFR calc non Af Amer: 10 mL/min — ABNORMAL LOW (ref >90)
Glucose, Bld: 212 mg/dL — ABNORMAL HIGH (ref 70–99)
Potassium: 4.6 mEq/L (ref 3.5–5.1)

## 2013-05-04 LAB — TROPONIN I: Troponin I: 4.46 ng/mL (ref <0.30)

## 2013-05-04 MED ORDER — SODIUM CHLORIDE 0.9 % IV SOLN
INTRAVENOUS | Status: DC
  Administered 2013-05-04: 09:00:00 via INTRAVENOUS

## 2013-05-04 MED ORDER — INSULIN GLARGINE 100 UNIT/ML ~~LOC~~ SOLN
30.0000 [IU] | Freq: Every day | SUBCUTANEOUS | Status: DC
  Administered 2013-05-04: 30 [IU] via SUBCUTANEOUS
  Filled 2013-05-04 (×2): qty 0.3

## 2013-05-04 MED ORDER — LEVOFLOXACIN 250 MG PO TABS
250.0000 mg | ORAL_TABLET | Freq: Every day | ORAL | Status: DC
  Administered 2013-05-04 – 2013-05-08 (×5): 250 mg via ORAL
  Filled 2013-05-04 (×5): qty 1

## 2013-05-04 MED ORDER — GUAIFENESIN 100 MG/5ML PO SYRP
200.0000 mg | ORAL_SOLUTION | ORAL | Status: DC | PRN
  Administered 2013-05-04 (×2): 200 mg via ORAL
  Filled 2013-05-04 (×2): qty 10

## 2013-05-04 MED ORDER — INSULIN GLARGINE 100 UNIT/ML ~~LOC~~ SOLN
28.0000 [IU] | Freq: Every day | SUBCUTANEOUS | Status: DC
  Filled 2013-05-04: qty 0.28

## 2013-05-04 NOTE — Progress Notes (Signed)
Patient ID: Sharon [name], female   DOB: Mar 07, 1916, 79 y.o.   MRN: [id number] Subjective:  Dyspnea improved. No chest pain  Objective:  Vital Signs in the last 24 hours: Temp:  [97.6 F (36.4 C)-98.5 F (36.9 C)] 97.9 F (36.6 C) (10/09 0515) Pulse Rate:  [73-95] 75 (10/09 0515) Resp:  [18-20] 20 (10/09 0515) BP: (128-141)/(41-50) 137/46 mmHg (10/09 0515) SpO2:  [95 %-99 %] 99 % (10/09 0515) Weight:  [194 lb 3.2 oz (88.089 kg)-195 lb 12.3 oz (88.8 kg)] 194 lb 3.2 oz (88.089 kg) (10/09 0515)  Intake/Output from previous day: 10/08 0701 - 10/09 0700 In: 745.4 [P.O.:600; I.V.:145.4] Out: -  Intake/Output from this shift:    Physical Exam: Elderly appearing 79 yo woman, NAD HEENT: Unremarkable Neck:  7 cm JVD, no thyromegally Back:  No CVA tenderness Lungs:  Scattered rales and rhonchi but no increased work of breathing. HEART:  Regular rate rhythm, no murmurs, no rubs, no clicks Abd:  soft, positive bowel sounds, no organomegally, no rebound, no guarding Ext:  2 plus pulses, no edema, no cyanosis, no clubbing Skin:  No rashes no nodules Neuro:  CN II through XII intact, motor grossly intact  Lab Results:  Recent Labs  05/02/13 0538 05/03/13 0525  WBC 13.0* 9.3  HGB 10.8* 9.4*  PLT 201 178    Recent Labs  05/02/13 0538 05/03/13 0525  NA 142 140  K 4.3 4.3  CL 102 103  CO2 30 25  GLUCOSE 219* 294*  BUN 32* 53*  CREATININE 2.44* 3.14*    Recent Labs  05/02/13 1035 05/02/13 1601  TROPONINI 18.43* >20.00*   Hepatic Function Panel  Recent Labs  05/02/13 0538  PROT 6.0  ALBUMIN 2.7*  AST 51*  ALT 12  ALKPHOS 93  BILITOT 0.7   No results found for this basename: CHOL,  in the last 72 hours No results found for this basename: PROTIME,  in the last 72 hours  Imaging: No results found.  Cardiac Studies: Tele - NSR with LBBB Assessment/Plan:  1. NSTEMI 2. Acute on chronic renal failure 3. Acute on chronic systolic heart failure Rec: note  prior recs for conservative therapy. Ok to continue IV heparin another 24 hours then would wean. Could consider plavix/asa/low dose beta blocker. No ARB or ACE due to worsening renal dysfunction. If kidney function worsens and we are not going to consider dialysis, could consider hospice.  LOS: 2 days    Thurl Boen,M.D. 05/04/2013, 8:39 AM

## 2013-05-04 NOTE — Progress Notes (Signed)
Pt a/o, no c/o pain, pt c/o cough, PRN med given as ordered, will continue to monitor

## 2013-05-04 NOTE — Progress Notes (Signed)
ANTICOAGULATION CONSULT NOTE - Follow Up Consult  Pharmacy Consult for heparin Indication: NSTEMI  Allergies  Allergen Reactions  . Codeine     Nausea & vomiting  . Ezetimibe   . Hydrocodone Bitartrate   . Latex   . Levaquin [Levofloxacin] Nausea And Vomiting  . Naproxen     Patient Measurements: Height: 5' (152.4 cm) Weight: 194 lb 3.2 oz (88.089 kg) (bed scale) IBW/kg (Calculated) : 45.5 Heparin Dosing Weight: 66 kg   Vital Signs: Temp: 97.9 F (36.6 C) (10/09 0515) Temp src: Oral (10/09 0515) BP: 137/46 mmHg (10/09 0515) Pulse Rate: 75 (10/09 0515)  Labs:  Recent Labs  05/02/13 0538 05/02/13 1035  05/02/13 1601  05/03/13 0525 05/03/13 1400 05/04/13 0915  HGB 10.8*  --   --   --   --  9.4*  --  10.0*  HCT 33.0*  --   --   --   --  28.4*  --  29.4*  PLT 201  --   --   --   --  178  --  218  HEPARINUNFRC  --   --   < >  --   < > 0.22* 0.28* 0.45  CREATININE 2.44*  --   --   --   --  3.14*  --  3.46*  CKTOTAL 517*  --   --   --   --   --   --   --   TROPONINI 16.83* 18.43*  --  >20.00*  --   --   --   --   < > = values in this interval not displayed.  Estimated Creatinine Clearance: 9.6 ml/min (by C-G formula based on Cr of 3.46).   Assessment: 79 yo female with NSTEMI is currently on therapeutic heparin.  Heparin level is 0.45. Goal of Therapy:  Heparin level 0.3-0.7 units/ml Monitor platelets by anticoagulation protocol: Yes   Plan:  1) Continue heparin at 1050 units/hr 2) Daily heparin level, CBC  Mickeal Skinner 05/04/2013,12:48 PM

## 2013-05-04 NOTE — Progress Notes (Signed)
[facility] PROGRESS NOTE  Sharon [name] WUJ:[id number] DOB: [birth year] DOA: 05/02/2013 PCP: Duncan Dull, MD  Assessment/Plan: Principal Problem:   NSTEMI (non-ST elevated myocardial infarction): Appreciate cardiology help. Medically managed. Continue nitroglycerin and heparin. Recheck troponin today. Cardiology feels that we can do one more day of heparin and we could possibly stop tomorrow. Active Problems:   Coronary artery disease, occlusive   Hypertension: Blood pressure stable. Continue beta blocker   Uncontrolled secondary diabetes mellitus with stage 4 CKD (GFR 15-29): Continue sliding scale, and increase Lantus from 25-30.   Chronic kidney disease (CKD), stage IV (severe)   GERD (gastroesophageal reflux disease): Stable   Acute respiratory failure   CHF (congestive heart failure) versus COPD: Some measure likely both. Continue nebulizers and steroids. Gave some additional IV fluids yesterday given scanty urine output. Noted slightly increasing white blood cell count, will check UA and started Levaquin ARF: Slightly worse today. Stop Lasix. Minimal gentle hydration   Code Status: DO NOT RESUSCITATE as confirmed by some Family Communication: Left message with son Disposition Plan: Possibly discharge tomorrow. Patient is from independent living, will check physical therapy evaluation   Consultants:  [place] [facility]  Procedures:  Echocardiogram done 10/7: Grade 1 diastolic dysfunction, ejection fraction 35-40%  Antibiotics:  Vancomycin and Zosyn 10/6-10/7  Oral Levaquin started 10/9  HPI/Subjective: Patient feeling markedly better. Denies any pain. Her only complaint is inability to get any phlegm up  Objective: Filed Vitals:   05/04/13 0515  BP: 137/46  Pulse: 75  Temp: 97.9 F (36.6 C)  Resp: 20    Intake/Output Summary (Last 24 hours) at 05/04/13 0855 Last data filed at 05/03/13 1902  Gross per 24 hour  Intake    240 ml  Output      0 ml   Net    240 ml   Filed Weights   05/03/13 0227 05/03/13 1618 05/04/13 0515  Weight: 87.7 kg (193 lb 5.5 oz) 88.8 kg (195 lb 12.3 oz) 88.089 kg (194 lb 3.2 oz)    Exam:   General:  Alert and oriented x3, no acute distress  Cardiovascular: Regular rate and rhythm, S1-S2, 2/6 systolic ejection murmur  Respiratory: , Mild bilateral end expiratory wheezing  Abdomen: Soft, obese, nontender, positive bowel sounds  Musculoskeletal: No clubbing or cyanosis, trace pitting edema   Data Reviewed: Basic Metabolic Panel:  Recent Labs Lab 05/02/13 0538 05/03/13 0525  NA 142 140  K 4.3 4.3  CL 102 103  CO2 30 25  GLUCOSE 219* 294*  BUN 32* 53*  CREATININE 2.44* 3.14*  CALCIUM 8.4 7.7*   Liver Function Tests:  Recent Labs Lab 05/02/13 0538  AST 51*  ALT 12  ALKPHOS 93  BILITOT 0.7  PROT 6.0  ALBUMIN 2.7*   No results found for this basename: LIPASE, AMYLASE,  in the last 168 hours No results found for this basename: AMMONIA,  in the last 168 hours CBC:  Recent Labs Lab 05/02/13 0538 05/03/13 0525  WBC 13.0* 9.3  NEUTROABS 10.7*  --   HGB 10.8* 9.4*  HCT 33.0* 28.4*  MCV 89.4 89.3  PLT 201 178   Cardiac Enzymes:  Recent Labs Lab 05/02/13 0538 05/02/13 1035 05/02/13 1601  CKTOTAL 517*  --   --   TROPONINI 16.83* 18.43* >20.00*   BNP (last 3 results) No results found for this basename: PROBNP,  in the last 8760 hours CBG:  Recent Labs Lab 05/03/13 0825 05/03/13 1109 05/03/13 1630 05/03/13 2125 05/04/13 0702  GLUCAP  242* 262* 338* 241* 197*    Recent Results (from the past 240 hour(s))  MRSA PCR SCREENING     Status: None   Collection Time    05/02/13  4:59 AM      Result Value Range Status   MRSA by PCR NEGATIVE  NEGATIVE Final   Comment:            The GeneXpert MRSA Assay (FDA     approved for NASAL specimens     only), is one component of a     comprehensive MRSA colonization     surveillance program. It is not     intended to  diagnose MRSA     infection nor to guide or     monitor treatment for     MRSA infections.     Studies: No results found.  Scheduled Meds: . albuterol  2.5 mg Nebulization Q6H  . aspirin EC  81 mg Oral Daily  . atorvastatin  40 mg Oral q1800  . budesonide (PULMICORT) nebulizer solution  0.25 mg Nebulization BID  . insulin aspart  0-15 Units Subcutaneous TID WC  . insulin aspart  0-5 Units Subcutaneous QHS  . insulin glargine  28 Units Subcutaneous QHS  . ipratropium  0.5 mg Nebulization Q6H  . isosorbide mononitrate  30 mg Oral Daily  . methylPREDNISolone (SOLU-MEDROL) injection  40 mg Intravenous Q12H  . metoprolol tartrate  12.5 mg Oral BID  . pramipexole  0.5 mg Oral QHS  . ranolazine  500 mg Oral BID  . sodium chloride  3 mL Intravenous Q12H  . sodium chloride  3 mL Intravenous Q12H   Continuous Infusions: . sodium chloride    . heparin 1,050 Units/hr (05/03/13 1654)    Principal Problem:   NSTEMI (non-ST elevated myocardial infarction) Active Problems:   Coronary artery disease, occlusive   Hypertension   Uncontrolled secondary diabetes mellitus with stage 4 CKD (GFR 15-29)   Chronic kidney disease (CKD), stage IV (severe)   GERD (gastroesophageal reflux disease)   Acute respiratory failure   CHF (congestive heart failure)    Time spent: 25 minutes    [name]  [facility] Pager [phone]. If 7PM-7AM, please contact night-coverage at [email or web address], password [facility] 05/04/2013, 8:55 AM  LOS: 2 days

## 2013-05-05 LAB — GLUCOSE, CAPILLARY
Glucose-Capillary: 168 mg/dL — ABNORMAL HIGH (ref 70–99)
Glucose-Capillary: 270 mg/dL — ABNORMAL HIGH (ref 70–99)

## 2013-05-05 LAB — HEPARIN LEVEL (UNFRACTIONATED): Heparin Unfractionated: 1.1 IU/mL — ABNORMAL HIGH (ref 0.30–0.70)

## 2013-05-05 MED ORDER — INSULIN GLARGINE 100 UNIT/ML ~~LOC~~ SOLN
27.0000 [IU] | Freq: Every day | SUBCUTANEOUS | Status: DC
  Administered 2013-05-05: 22:00:00 27 [IU] via SUBCUTANEOUS
  Filled 2013-05-05 (×2): qty 0.27

## 2013-05-05 MED ORDER — PREDNISONE 20 MG PO TABS
40.0000 mg | ORAL_TABLET | Freq: Two times a day (BID) | ORAL | Status: DC
  Administered 2013-05-05 – 2013-05-07 (×4): 40 mg via ORAL
  Filled 2013-05-05 (×6): qty 2

## 2013-05-05 MED ORDER — ENOXAPARIN SODIUM 30 MG/0.3ML ~~LOC~~ SOLN
30.0000 mg | SUBCUTANEOUS | Status: DC
  Administered 2013-05-05 – 2013-05-07 (×3): 30 mg via SUBCUTANEOUS
  Filled 2013-05-05 (×4): qty 0.3

## 2013-05-05 MED ORDER — HEPARIN (PORCINE) IN NACL 100-0.45 UNIT/ML-% IJ SOLN
900.0000 [IU]/h | INTRAMUSCULAR | Status: AC
  Administered 2013-05-05: 07:00:00 900 [IU]/h via INTRAVENOUS
  Filled 2013-05-05: qty 250

## 2013-05-05 MED ORDER — POLYETHYLENE GLYCOL 3350 17 G PO PACK
17.0000 g | PACK | Freq: Every day | ORAL | Status: DC
  Administered 2013-05-05 – 2013-05-08 (×4): 17 g via ORAL
  Filled 2013-05-05 (×4): qty 1

## 2013-05-05 NOTE — Progress Notes (Signed)
    Patient ID: Sharon [name], female   DOB: [birth year], 79 y.o.   MRN: [id number] Subjective:  Dyspnea improved. No chest pain Nonproductive cough  Objective:  Vital Signs in the last 24 hours: Temp:  [97.8 F (36.6 C)-98.1 F (36.7 C)] 97.8 F (36.6 C) (10/10 0620) Pulse Rate:  [80-85] 82 (10/10 0620) Resp:  [18-20] 18 (10/10 0620) BP: (118-155)/(52-68) 150/52 mmHg (10/10 0620) SpO2:  [95 %-100 %] 98 % (10/10 0620) Weight:  [196 lb 12.8 oz (89.268 kg)] 196 lb 12.8 oz (89.268 kg) (10/10 0620)  Intake/Output from previous day: 10/09 0701 - 10/10 0700 In: 726 [P.O.:720; I.V.:6] Out: -  Intake/Output from this shift:    Physical Exam: Elderly appearing 79 yo woman, NAD HEENT: Unremarkable Neck:  7 cm JVD, no thyromegally Back:  No CVA tenderness Lungs:  Scattered rales and rhonchi but no increased work of breathing. HEART:  Regular rate rhythm, no murmurs, no rubs, no clicks Abd:  soft, positive bowel sounds, no organomegally, no rebound, no guarding Ext:  2 plus pulses, no edema, no cyanosis, no clubbing Skin:  No rashes no nodules Neuro:  CN II through XII intact, motor grossly intact  Lab Results:  Recent Labs  05/03/13 0525 05/04/13 0915  WBC 9.3 12.4*  HGB 9.4* 10.0*  PLT 178 218    Recent Labs  05/03/13 0525 05/04/13 0915  NA 140 135  K 4.3 4.6  CL 103 100  CO2 25 22  GLUCOSE 294* 212*  BUN 53* 76*  CREATININE 3.14* 3.46*    Recent Labs  05/02/13 1601 05/04/13 1454  TROPONINI >20.00* 4.46*    Imaging: No results found.  Cardiac Studies: Tele - NSR with LBBB Assessment/Plan:  1. NSTEMI 2. Acute on chronic renal failure 3. Acute on chronic systolic heart failure Rec: note prior recs for conservative therapy. DNR   Not a dialysis candidate  Consider hospice  Will sign off   Needs Incentive spirometry  LOS: 3 days    Sharon [name],M.D. 05/05/2013, 8:00 AM

## 2013-05-05 NOTE — Progress Notes (Signed)
A/Ox4 and hearing impaired. She had no c/o pain and no signs of distress. She had c/o cough and prn cough syrup was given. Pharmacist called at 0618 to notify about patient's elevated heparin level and was told to stop heparin infusion and restart again at 0700 at a dose of 9 ml/hour (900 units/hour).

## 2013-05-05 NOTE — Progress Notes (Signed)
ANTICOAGULATION CONSULT NOTE - Follow Up Consult  Pharmacy Consult for heparin Indication: NSTEMI  Labs:  Recent Labs  05/02/13 1035  05/02/13 1601  05/03/13 0525 05/03/13 1400 05/04/13 0915 05/04/13 1454 05/05/13 0510  HGB  --   --   --   --  9.4*  --  10.0*  --   --   HCT  --   --   --   --  28.4*  --  29.4*  --   --   PLT  --   --   --   --  178  --  218  --   --   HEPARINUNFRC  --   < >  --   < > 0.22* 0.28* 0.45  --  1.10*  CREATININE  --   --   --   --  3.14*  --  3.46*  --   --   TROPONINI 18.43*  --  >20.00*  --   --   --   --  4.46*  --   < > = values in this interval not displayed.   Assessment: 79yo female now supratherapeutic on heparin after one level at goal, lab drawn correctly per RN.  Pt being tx'd conservatively for NSTEMI w/ plan for possible weaning off heparin today.  Goal of Therapy:  Heparin level 0.3-0.7 units/ml   Plan:  Will hold heparin gtt x48min then resume at lower rate of 900 units/hr and check level in 8hr vs f/u regarding LOT.  Vernard Gambles, PharmD, BCPS  05/05/2013,6:21 AM

## 2013-05-05 NOTE — Progress Notes (Signed)
Physical Therapy Evaluation Patient Details Name: Sharon [name] MRN: [id number] DOB: Oct 09, 1915 Today's Date: 05/05/2013 Time: 7829-5621 PT Time Calculation (min): 31 min  PT Assessment / Plan / Recommendation History of Present Illness  Pt admit with CHF and NSTEMI.  Clinical Impression  Pt admitted with above. Pt currently with functional limitations due to the deficits listed below (see PT Problem List). Pt will need SNF for therapy prior to return to her A  Living apartment.   Pt will benefit from skilled PT to increase their independence and safety with mobility to allow discharge to the venue listed below.     PT Assessment  Patient needs continued PT services    Follow Up Recommendations  SNF;Supervision/Assistance - 24 hour         Barriers to Discharge Decreased caregiver support      Equipment Recommendations  None recommended by PT         Frequency Min 3X/week    Precautions / Restrictions Precautions Precautions: Fall Restrictions Weight Bearing Restrictions: No   Pertinent Vitals/Pain VSS, No pain      Mobility  Bed Mobility Bed Mobility: Rolling Right;Right Sidelying to Sit;Sitting - Scoot to Edge of Bed Rolling Right: 4: Min assist;With rail Right Sidelying to Sit: 4: Min assist;HOB elevated;With rails Sitting - Scoot to Edge of Bed: 4: Min assist Details for Bed Mobility Assistance: cues and assist for technique. Total assist to don depends diaper as pt incontinent of urine PTA.   Transfers Transfers: Sit to Stand;Stand to Sit Sit to Stand: 1: +2 Total assist;With upper extremity assist;From bed Sit to Stand: Patient Percentage: 70% Stand to Sit: 1: +2 Total assist;With upper extremity assist;To chair/3-in-1;With armrests Stand to Sit: Patient Percentage: 70% Details for Transfer Assistance: Pt needed cues for hand placement for sit to stand and stand to sit.  Needed steadying assist upon standing.  Ambulation/Gait Ambulation/Gait Assistance:  1: +2 Total assist Ambulation/Gait: Patient Percentage: 80% Ambulation Distance (Feet): 100 Feet Assistive device: Rolling walker Ambulation/Gait Assistance Details: Pt ambulated with RW with min assist and cues.  Pt with instability in bil knees needing steadying assist.  Cues for postural stability and to stay close to RW.  Needed assist to move the RW as well.  Pt O2 sat 98% at rest on 5LO2.  Ambulated without O2 with sats at 90 and > with ambulation.  Desat to 88% once seated after walk but came back to 93% within seconds.  Replaced O2 at 5 LO2 as that is what it was initially but notified nursing re: that she ambulated pretty well off the O2.   Gait Pattern: Step-through pattern;Decreased stride length;Wide base of support;Trunk flexed Gait velocity: decreased Stairs: No Wheelchair Mobility Wheelchair Mobility: No         PT Diagnosis: Generalized weakness  PT Problem List: Decreased activity tolerance;Decreased balance;Decreased mobility;Decreased knowledge of use of DME;Decreased safety awareness;Decreased knowledge of precautions PT Treatment Interventions: Gait training;Functional mobility training;DME instruction;Therapeutic activities;Therapeutic exercise;Balance training;Patient/family education     PT Goals(Current goals can be found in the care plan section) Acute Rehab PT Goals Patient Stated Goal: to go home after a NH stay PT Goal Formulation: With patient Time For Goal Achievement: 05/19/13 Potential to Achieve Goals: Good  Visit Information  Last PT Received On: 05/05/13 Assistance Needed: +2 (for chair follow) History of Present Illness: Pt admit with CHF and NSTEMI.       Prior Functioning  Home Living Family/patient expects to be discharged to:: Skilled nursing  facility Living Arrangements: Alone Available Help at Discharge: Available PRN/intermittently (A living ) Type of Home: Assisted living Home Access: Level entry Home Layout: Two level (has  elevator) Home Equipment: Walker - 4 wheels;Walker - 2 wheels;Bedside commode;Shower seat - built in;Grab bars - toilet;Grab bars - tub/shower;Hand held shower head (3 wheeled walker she uses most) Prior Function Level of Independence: Independent with assistive device(s);Needs assistance Gait / Transfers Assistance Needed: Modif I with device ADL's / Homemaking Assistance Needed: min assist with bathing and independent with dressing. Communication Communication: No difficulties    Cognition  Cognition Arousal/Alertness: Awake/alert Behavior During Therapy: WFL for tasks assessed/performed Overall Cognitive Status: History of cognitive impairments - at baseline    Extremity/Trunk Assessment Upper Extremity Assessment Upper Extremity Assessment: Defer to OT evaluation Lower Extremity Assessment Lower Extremity Assessment: Generalized weakness Cervical / Trunk Assessment Cervical / Trunk Assessment: Kyphotic   Balance Balance Balance Assessed: Yes Static Standing Balance Static Standing - Balance Support: Bilateral upper extremity supported;During functional activity Static Standing - Level of Assistance: 4: Min assist Static Standing - Comment/# of Minutes: 3 High Level Balance High Level Balance Activites: Turns;Direction changes High Level Balance Comments: Has difficulty with turns and direction changes needing min steadying assist with RW.    End of Session PT - End of Session Equipment Utilized During Treatment: Gait belt;Oxygen Activity Tolerance: Patient limited by fatigue Patient left: in chair;with call bell/phone within reach Nurse Communication: Mobility status       INGOLD,Khaleelah Yowell 05/05/2013, 3:13 PM [facility] Acute Rehabilitation [phone] (pager)

## 2013-05-05 NOTE — Progress Notes (Signed)
[facility] PROGRESS NOTE  Sharon [name] LKG:[id number] DOB: 11-24-1915 DOA: 05/02/2013 PCP: Duncan Dull, MD  Interim summary:  Patient is a 79 year old white female from assisted living with multiple medical problems including CAD, advanced chronic kidney disease, COPD and diabetes mellitus plus CHF who presented in with shortness of breath. Patient is a DO NOT RESUSCITATE, but did show improvement in regards to BiPAP. Initially she was in the step down unit. Initial cardiac marker troponin was minimally elevated, but second set was markedly elevated at 16. Cardiology was consulted and patient was felt to have an acute non-STEMI. Given her advanced age and chronic medical conditions, medical management was up to be appropriate. Patient's son confirmed that no heroic measures were to be done. Patient was started on heparin and nitro paste and over several days, has somewhat stabilized. Active issues include worsening renal function.   Assessment/Plan: Principal Problem:   NSTEMI (non-ST elevated myocardial infarction): Appreciate cardiology help. Medically managed. Troponin down to 4 yesterday. Discontinue heparin this evening. Start prophylactic Lovenox. Active Problems:   Coronary artery disease, occlusive    Hypertension: Blood pressure stable. Continue beta blocker    Uncontrolled secondary diabetes mellitus with stage 4 CKD (GFR 15-29): Continue sliding scale. Lantus home dose of 25 was increased to 30 given increasing hyperglycemia, likely brought on by medical issues and steroids. At that we are changing Solu-Medrol prednisone, wean back Lantus.     GERD (gastroesophageal reflux disease): Stable    Acute respiratory failure: overall improved    CHF (congestive heart failure) versus COPD: Some measure likely both. Continue nebulizers. Change Solu-Medrol to prednisone by mouth taper. Started on by mouth Levaquin yesterday.  ARF in setting of  Chronic kidney disease (CKD),  stage IV (severe): Continues to worsen. Discussed this with the patient's son. Recheck tomorrow. If creatinine continues to worsen, would get palliative care involved. Patient's son is quite realistic and hopes his mother will get better, but understands she may not.   Code Status: DO NOT RESUSCITATE as confirmed by some Family Communication:  Disposition Plan:    Consultants:  [facility]  Procedures:  Echocardiogram done 10/7: Grade 1 diastolic dysfunction, ejection fraction 35-40%  Antibiotics:  Vancomycin and Zosyn 10/6-10/7  Oral Levaquin started 10/9  HPI/Subjective: Patient feeling somewhat better. Continued feeling of congestion but feels like she cannot cough it up. She says that this happens a lot. Feels like breathing overall is better.  Objective: Filed Vitals:   05/05/13 0620  BP: 150/52  Pulse: 82  Temp: 97.8 F (36.6 C)  Resp: 18    Intake/Output Summary (Last 24 hours) at 05/05/13 0915 Last data filed at 05/05/13 0849  Gross per 24 hour  Intake    726 ml  Output      0 ml  Net    726 ml   Filed Weights   05/03/13 1618 05/04/13 0515 05/05/13 0620  Weight: 88.8 kg (195 lb 12.3 oz) 88.089 kg (194 lb 3.2 oz) 89.268 kg (196 lb 12.8 oz)    Exam:   General:  Alert and oriented x3, no acute distress  Cardiovascular: Regular rate and rhythm, S1-S2, 2/6 systolic ejection murmur  Respiratory: , Mild bilateral end expiratory wheezing  Abdomen: Soft, obese, nontender, positive bowel sounds  Musculoskeletal: No clubbing or cyanosis, trace pitting edema   Data Reviewed: Basic Metabolic Panel:  Recent Labs Lab 05/02/13 0538 05/03/13 0525 05/04/13 0915  NA 142 140 135  K 4.3 4.3 4.6  CL 102 103 100  CO2 30 25 22   GLUCOSE 219* 294* 212*  BUN 32* 53* 76*  CREATININE 2.44* 3.14* 3.46*  CALCIUM 8.4 7.7* 7.8*   Liver Function Tests:  Recent Labs Lab 05/02/13 0538  AST 51*  ALT 12  ALKPHOS 93  BILITOT 0.7  PROT 6.0  ALBUMIN 2.7*    No results found for this basename: LIPASE, AMYLASE,  in the last 168 hours No results found for this basename: AMMONIA,  in the last 168 hours CBC:  Recent Labs Lab 05/02/13 0538 05/03/13 0525 05/04/13 0915  WBC 13.0* 9.3 12.4*  NEUTROABS 10.7*  --   --   HGB 10.8* 9.4* 10.0*  HCT 33.0* 28.4* 29.4*  MCV 89.4 89.3 88.8  PLT 201 178 218   Cardiac Enzymes:  Recent Labs Lab 05/02/13 0538 05/02/13 1035 05/02/13 1601 05/04/13 1454  CKTOTAL 517*  --   --   --   TROPONINI 16.83* 18.43* >20.00* 4.46*   BNP (last 3 results) No results found for this basename: PROBNP,  in the last 8760 hours CBG:  Recent Labs Lab 05/04/13 0702 05/04/13 1122 05/04/13 1639 05/04/13 2227 05/05/13 0640  GLUCAP 197* 224* 228* 209* 168*    Recent Results (from the past 240 hour(s))  MRSA PCR SCREENING     Status: None   Collection Time    05/02/13  4:59 AM      Result Value Range Status   MRSA by PCR NEGATIVE  NEGATIVE Final   Comment:            The GeneXpert MRSA Assay (FDA     approved for NASAL specimens     only), is one component of a     comprehensive MRSA colonization     surveillance program. It is not     intended to diagnose MRSA     infection nor to guide or     monitor treatment for     MRSA infections.     Studies: No results found.  Scheduled Meds: . albuterol  2.5 mg Nebulization Q6H  . aspirin EC  81 mg Oral Daily  . atorvastatin  40 mg Oral q1800  . budesonide (PULMICORT) nebulizer solution  0.25 mg Nebulization BID  . insulin aspart  0-15 Units Subcutaneous TID WC  . insulin aspart  0-5 Units Subcutaneous QHS  . insulin glargine  30 Units Subcutaneous QHS  . ipratropium  0.5 mg Nebulization Q6H  . isosorbide mononitrate  30 mg Oral Daily  . levofloxacin  250 mg Oral Daily  . methylPREDNISolone (SOLU-MEDROL) injection  40 mg Intravenous Q12H  . metoprolol tartrate  12.5 mg Oral BID  . polyethylene glycol  17 g Oral Daily  . pramipexole  0.5 mg Oral QHS   . ranolazine  500 mg Oral BID  . sodium chloride  3 mL Intravenous Q12H  . sodium chloride  3 mL Intravenous Q12H   Continuous Infusions: . sodium chloride 50 mL/hr at 05/04/13 0904  . heparin 900 Units/hr (05/05/13 0705)    Principal Problem:   NSTEMI (non-ST elevated myocardial infarction) Active Problems:   Coronary artery disease, occlusive   Hypertension   Uncontrolled secondary diabetes mellitus with stage 4 CKD (GFR 15-29)   Chronic kidney disease (CKD), stage IV (severe)   GERD (gastroesophageal reflux disease)   Acute respiratory failure   CHF (congestive heart failure)    Time spent: 25 minutes    [name]  [facility] Pager [phone]. If 7PM-7AM, please contact night-coverage at  [email or web address], password [facility] 05/05/2013, 9:15 AM  LOS: 3 days

## 2013-05-06 LAB — URINALYSIS, ROUTINE W REFLEX MICROSCOPIC
Bilirubin Urine: NEGATIVE
Glucose, UA: NEGATIVE mg/dL
Hgb urine dipstick: NEGATIVE
Ketones, ur: NEGATIVE mg/dL
Protein, ur: NEGATIVE mg/dL
pH: 5 (ref 5.0–8.0)

## 2013-05-06 LAB — GLUCOSE, CAPILLARY
Glucose-Capillary: 224 mg/dL — ABNORMAL HIGH (ref 70–99)
Glucose-Capillary: 190 mg/dL — ABNORMAL HIGH (ref 70–99)
Glucose-Capillary: 186 mg/dL — ABNORMAL HIGH (ref 70–99)
Glucose-Capillary: 205 mg/dL — ABNORMAL HIGH (ref 70–99)

## 2013-05-06 LAB — BASIC METABOLIC PANEL
CO2: 23 mEq/L (ref 19–32)
Calcium: 7.7 mg/dL — ABNORMAL LOW (ref 8.4–10.5)
Chloride: 102 mEq/L (ref 96–112)
GFR calc Af Amer: 14 mL/min — ABNORMAL LOW (ref >90)
Glucose, Bld: 206 mg/dL — ABNORMAL HIGH (ref 70–99)
Sodium: 136 mEq/L (ref 135–145)

## 2013-05-06 LAB — CULTURE, BLOOD (SINGLE)

## 2013-05-06 MED ORDER — INSULIN GLARGINE 100 UNIT/ML ~~LOC~~ SOLN
32.0000 [IU] | Freq: Every day | SUBCUTANEOUS | Status: DC
  Administered 2013-05-06 – 2013-05-07 (×2): 32 [IU] via SUBCUTANEOUS
  Filled 2013-05-06 (×3): qty 0.32

## 2013-05-06 NOTE — Progress Notes (Signed)
Pt still c/o SOB with minimal exertion, Wheezing present on auscultation, pt still on 5L O2 [place] with sat >94%. Denies any chest pain or discomfort, Neb tx given as ordered with relieve.

## 2013-05-06 NOTE — Progress Notes (Signed)
PATIENT DETAILS Name: Sharon [name] Age: 79 y.o. Sex: female Date of Birth: 03-Oct-1915 Admit Date: 05/02/2013 Admitting Physician Therisa Doyne, MD ZOX:[id number], MD  Subjective: No major complaints  Assessment/Plan:   NSTEMI (non-ST elevated myocardial infarction) - Seen by cardiology, plan is to manage medically. Currently chest pain-free - Continue with aspirin, statin, beta blocker and nitrates. - Echocardiogram shows EF of 35-40% with diffuse hypokinesis  Acute respiratory failure - Secondary to acute systolic heart failure and COPD exacerbation - Initially on admission required BiPAP, however now weaned off BiPAP and on oxygen via nasal cannula - Continue with steroids, nebulized bronchodilators, Levaquin, if creatinine continues to downtrend we'll resume Lasix  Acute systolic heart failure - Not a candidate for ACE inhibitor given advanced chronic kidney disease - Continue beta blockers - Resume Lasix when creatinine close to her usual baseline  Acute on chronic renal failure stage IV - Thankfully creatinine now down trending, continue to monitor electrolytes, not a candidate for hemodialysis. We'll start Lasix, if creatinine continues to downtrend.  Diabetes - Increase Lantus to 32 units - Continue SSI - Suspect better glycemic control once steroids continued to be weaned off  Hypertension - Moderate control - Continue beta blocker, Imdur  Restless leg syndrome - Continue Mirapex  Disposition: Remain inpatient  DVT Prophylaxis: Prophylactic Lovenox   Code Status: DNR  Family Communication None at bedside this morning  Procedures:  None  CONSULTS:  cardiology   MEDICATIONS: Scheduled Meds: . albuterol  2.5 mg Nebulization Q6H  . aspirin EC  81 mg Oral Daily  . atorvastatin  40 mg Oral q1800  . budesonide (PULMICORT) nebulizer solution  0.25 mg Nebulization BID  . enoxaparin (LOVENOX) injection  30 mg Subcutaneous Q24H  . insulin  aspart  0-15 Units Subcutaneous TID WC  . insulin aspart  0-5 Units Subcutaneous QHS  . insulin glargine  27 Units Subcutaneous QHS  . ipratropium  0.5 mg Nebulization Q6H  . isosorbide mononitrate  30 mg Oral Daily  . levofloxacin  250 mg Oral Daily  . metoprolol tartrate  12.5 mg Oral BID  . polyethylene glycol  17 g Oral Daily  . pramipexole  0.5 mg Oral QHS  . predniSONE  40 mg Oral BID WC  . ranolazine  500 mg Oral BID  . sodium chloride  3 mL Intravenous Q12H  . sodium chloride  3 mL Intravenous Q12H   Continuous Infusions:  PRN Meds:.acetaminophen, acetaminophen, albuterol, famotidine, guaifenesin, nitroGLYCERIN, ondansetron (ZOFRAN) IV, ondansetron, traMADol  Antibiotics: Anti-infectives   Start     Dose/Rate Route Frequency Ordered Stop   05/04/13 1400  levofloxacin (LEVAQUIN) tablet 250 mg     250 mg Oral Daily 05/04/13 1228     05/03/13 2200  vancomycin (VANCOCIN) IVPB 1000 mg/200 mL premix  Status:  Discontinued     1,000 mg 200 mL/hr over 60 Minutes Intravenous Every 48 hours 05/02/13 1341 05/02/13 1749   05/03/13 0600  ceFEPIme (MAXIPIME) 1 g in dextrose 5 % 50 mL IVPB  Status:  Discontinued     1 g 100 mL/hr over 30 Minutes Intravenous Every 24 hours 05/02/13 0632 05/02/13 1749   05/02/13 2200  vancomycin (VANCOCIN) IVPB 1000 mg/200 mL premix  Status:  Discontinued     1,000 mg 200 mL/hr over 60 Minutes Intravenous Every 24 hours 05/02/13 0631 05/02/13 1341   05/02/13 0630  ceFEPIme (MAXIPIME) 1 g in dextrose 5 % 50 mL IVPB     1 g 100 mL/hr over  30 Minutes Intravenous  Once 05/02/13 0620 05/02/13 0709   05/02/13 0630  vancomycin (VANCOCIN) IVPB 1000 mg/200 mL premix  Status:  Discontinued     1,000 mg 200 mL/hr over 60 Minutes Intravenous  Once 05/02/13 0620 05/02/13 0630       PHYSICAL EXAM: Vital signs in last 24 hours: Filed Vitals:   05/06/13 0429 05/06/13 0909 05/06/13 1000 05/06/13 1042  BP: 130/90  136/72 138/74  Pulse: 83  82 78  Temp: 97.2 F  (36.2 C)   97.6 F (36.4 C)  TempSrc: Oral   Oral  Resp: 22   16  Height:      Weight: 88.9 kg (195 lb 15.8 oz)     SpO2: 100% 99%  98%    Weight change: -0.368 kg (-13 oz) Filed Weights   05/04/13 0515 05/05/13 0620 05/06/13 0429  Weight: 88.089 kg (194 lb 3.2 oz) 89.268 kg (196 lb 12.8 oz) 88.9 kg (195 lb 15.8 oz)   Body mass index is 38.28 kg/(m^2).   Gen Exam: Awake and alert with clear speech.   Neck: Supple, No JVD.   Chest: Few scattered fine bibasilar rales CVS: S1 S2 Regular, no murmurs.  Abdomen: soft, BS +, non tender, non distended.  Extremities: 1+ edema, lower extremities warm to touch. Neurologic: Non Focal.   Skin: No Rash.   Wounds: N/A.    Intake/Output from previous day:  Intake/Output Summary (Last 24 hours) at 05/06/13 1112 Last data filed at 05/06/13 0821  Gross per 24 hour  Intake    878 ml  Output    700 ml  Net    178 ml     LAB RESULTS: CBC  Recent Labs Lab 05/02/13 0538 05/03/13 0525 05/04/13 0915  WBC 13.0* 9.3 12.4*  HGB 10.8* 9.4* 10.0*  HCT 33.0* 28.4* 29.4*  PLT 201 178 218  MCV 89.4 89.3 88.8  MCH 29.3 29.6 30.2  MCHC 32.7 33.1 34.0  RDW 14.7 15.0 14.6  LYMPHSABS 1.4  --   --   MONOABS 0.8  --   --   EOSABS 0.1  --   --   BASOSABS 0.0  --   --     Chemistries   Recent Labs Lab 05/02/13 0538 05/03/13 0525 05/04/13 0915 05/06/13 0540  NA 142 140 135 136  K 4.3 4.3 4.6 4.8  CL 102 103 100 102  CO2 30 25 22 23   GLUCOSE 219* 294* 212* 206*  BUN 32* 53* 76* 88*  CREATININE 2.44* 3.14* 3.46* 3.06*  CALCIUM 8.4 7.7* 7.8* 7.7*    CBG:  Recent Labs Lab 05/05/13 0640 05/05/13 1119 05/05/13 1633 05/05/13 2131 05/06/13 0545  GLUCAP 168* 270* 232* 220* 224*    GFR Estimated Creatinine Clearance: 10.9 ml/min (by C-G formula based on Cr of 3.06).  Coagulation profile No results found for this basename: INR, PROTIME,  in the last 168 hours  Cardiac Enzymes  Recent Labs Lab 05/02/13 1035 05/02/13 1601  05/04/13 1454  TROPONINI 18.43* >20.00* 4.46*    No components found with this basename: POCBNP,  No results found for this basename: DDIMER,  in the last 72 hours No results found for this basename: HGBA1C,  in the last 72 hours No results found for this basename: CHOL, HDL, LDLCALC, TRIG, CHOLHDL, LDLDIRECT,  in the last 72 hours No results found for this basename: TSH, T4TOTAL, FREET3, T3FREE, THYROIDAB,  in the last 72 hours No results found for this basename: VITAMINB12, FOLATE,  FERRITIN, TIBC, IRON, RETICCTPCT,  in the last 72 hours No results found for this basename: LIPASE, AMYLASE,  in the last 72 hours  Urine Studies No results found for this basename: UACOL, UAPR, USPG, UPH, UTP, UGL, UKET, UBIL, UHGB, UNIT, UROB, ULEU, UEPI, UWBC, URBC, UBAC, CAST, CRYS, UCOM, BILUA,  in the last 72 hours  MICROBIOLOGY: Recent Results (from the past 240 hour(s))  MRSA PCR SCREENING     Status: None   Collection Time    05/02/13  4:59 AM      Result Value Range Status   MRSA by PCR NEGATIVE  NEGATIVE Final   Comment:            The GeneXpert MRSA Assay (FDA     approved for NASAL specimens     only), is one component of a     comprehensive MRSA colonization     surveillance program. It is not     intended to diagnose MRSA     infection nor to guide or     monitor treatment for     MRSA infections.    RADIOLOGY STUDIES/RESULTS: Dg Chest Port 1 View  05/02/2013   CLINICAL DATA:  Short of breath  EXAM: PORTABLE CHEST - 1 VIEW  COMPARISON:  None.  FINDINGS: Cardiac silhouette is normal in size. Mediastinum is normal in contour.  Medial lung base opacity is noted bilaterally, likely atelectasis. There is central interstitial thickening. Mild reticular opacities are noted in the left mid lung peripherally. No overt pulmonary edema or convincing infiltrate. No pleural effusion or pneumothorax is seen.  IMPRESSION: Central interstitial thickening with medial lung base atelectasis. No overt  edema or convincing infiltrate.   Electronically Signed   By: Amie Portland M.D.   On: 05/02/2013 07:47    Jeoffrey Massed, MD  [facility] Pager:[phone]  If 7PM-7AM, please contact night-coverage [email or web address] Password [email or web address] 05/06/2013, 11:12 AM   LOS: 4 days

## 2013-05-06 NOTE — Progress Notes (Signed)
Pt is very restless, continues with 5L O2 [place] with saturation >95%, denies any SOB or pain at this time. We'll continue with POC.

## 2013-05-06 NOTE — Progress Notes (Signed)
Tolerating being up in chair.  SOB with minimal exertion.

## 2013-05-07 ENCOUNTER — Inpatient Hospital Stay (HOSPITAL_COMMUNITY): Payer: MEDICARE

## 2013-05-07 LAB — URINE CULTURE

## 2013-05-07 LAB — BASIC METABOLIC PANEL
CO2: 26 mEq/L (ref 19–32)
Calcium: 8.2 mg/dL — ABNORMAL LOW (ref 8.4–10.5)
Creatinine, Ser: 2.99 mg/dL — ABNORMAL HIGH (ref 0.50–1.10)
GFR calc Af Amer: 14 mL/min — ABNORMAL LOW (ref >90)
GFR calc non Af Amer: 12 mL/min — ABNORMAL LOW (ref >90)
Glucose, Bld: 239 mg/dL — ABNORMAL HIGH (ref 70–99)
Sodium: 138 mEq/L (ref 135–145)

## 2013-05-07 LAB — GLUCOSE, CAPILLARY: Glucose-Capillary: 218 mg/dL — ABNORMAL HIGH (ref 70–99)

## 2013-05-07 MED ORDER — PREDNISONE 20 MG PO TABS
40.0000 mg | ORAL_TABLET | Freq: Every day | ORAL | Status: DC
  Administered 2013-05-08: 07:00:00 40 mg via ORAL
  Filled 2013-05-07 (×2): qty 2

## 2013-05-07 MED ORDER — FUROSEMIDE 10 MG/ML IJ SOLN
80.0000 mg | Freq: Once | INTRAMUSCULAR | Status: AC
  Administered 2013-05-07: 80 mg via INTRAVENOUS
  Filled 2013-05-07: qty 8

## 2013-05-07 NOTE — Progress Notes (Signed)
Pt resting comfortable on bed. O2 [place] decreased to 3L at this time. No c/o pain or SOB, VSS, no distress noticed. We'll continue with POC.

## 2013-05-07 NOTE — Clinical Social Work Psychosocial (Signed)
Clinical Social Work Department BRIEF PSYCHOSOCIAL ASSESSMENT 05/07/2013  Patient:  Sharon [name], Sharon [name]     Account Number:  [id number]     Admit date:  05/02/2013  Clinical Social Worker:  Lavell Luster  Date/Time:  05/07/2013 02:00 PM  Referred by:  Physician  Date Referred:  05/07/2013 Referred for  SNF Placement   Other Referral:   Interview type:  Family Other interview type:   Per patient's request, CSW interviewed patient's son.    PSYCHOSOCIAL DATA Living Status:  FACILITY Admitted from facility:  [facility] Level of care:  Assisted Living Primary support name:  Myca Perno [phone] or [phone] Primary support relationship to patient:  CHILD, ADULT Degree of support available:   Support is strong.    CURRENT CONCERNS Current Concerns  Post-Acute Placement   Other Concerns:    SOCIAL WORK ASSESSMENT / PLAN CSW attempted to complete assessment with patient at bedside but patient asked CSW to contact son [name] to complete assessment. [name] states that patient is from [facility] in [place], [place]. Son is agreeable to SNF placement and states that plan is for patient to go to the SNF level of [facility] in [place]. Son has been in contact with facility and they are expecting information from CSW department at [facility]. CSW informed son that patient's information will be faxed to [facility] will leave report for weekday CSW.   Assessment/plan status:  Psychosocial Support/Ongoing Assessment of Needs Other assessment/ plan:   Complete FL2, PASRR, Fax out   Information/referral to community resources:   SNF list and CSW contact information left in patient's room.    PATIENT'S/FAMILY'S RESPONSE TO PLAN OF CARE: Patient and son are pleasant and appreciative of CSW contact. son plans for patient to DC to [facility]. Son plans to follow up with weekday CSW.    Roddie Mc, [place], [place], [phone]

## 2013-05-07 NOTE — Progress Notes (Signed)
Client becomes SOB with minimal exertion.  Gait is stiff and she has difficulty transferring from bed to chair and vice versa.  Generalized weakness

## 2013-05-07 NOTE — Clinical Social Work Placement (Addendum)
Clinical Social Work Department CLINICAL SOCIAL WORK PLACEMENT NOTE 05/07/2013  Patient:  Sharon [name], Sharon [name]  Account Number:  [id number] Admit date:  05/02/2013  Clinical Social Worker:  Cherre Blanc, [place]  Date/time:  05/07/2013 04:25 PM  Clinical Social Work is seeking post-discharge placement for this patient at the following level of care:   SKILLED NURSING   (*CSW will update this form in Epic as items are completed)   05/07/2013  Patient/family provided with [facility] Department of Clinical Social Work's list of facilities offering this level of care within the geographic area requested by the patient (or if unable, by the patient's family).  05/07/2013  Patient/family informed of their freedom to choose among providers that offer the needed level of care, that participate in Medicare, Medicaid or managed care program needed by the patient, have an available bed and are willing to accept the patient.  05/07/2013  Patient/family informed of [facility]' ownership interest in [facility], as well as of the fact that they are under no obligation to receive care at this facility.  PASARR submitted to EDS on  PASARR number received from EDS on 05/07/2013  FL2 transmitted to all facilities in geographic area requested by pt/family on  05/07/2013 FL2 transmitted to all facilities within larger geographic area on   Patient informed that his/her managed care company has contracts with or will negotiate with  certain facilities, including the following:     Patient/family informed of bed offers received:  05/08/2013 Patient chooses bed at  [facility] Physician recommends and patient chooses bed at    Patient to be transferred to [facility] on 05/08/2013  (JS) Patient to be transferred to facility by  Ambulance  (JS)  The following physician request were entered in Epic:   Roddie Mc, [place], [place], [phone]  Additional Comments: DC plan completed;  patient and family are aware. SNF notified and EMS called. Nursing to call report.  No further CSW needs identified.  Lorri Frederick. [place]  [phone]

## 2013-05-07 NOTE — Clinical Social Work Note (Signed)
CSW went to assess patient for SNF. Patient requested that CSW contact son [place] instead. CSW attempted to contact patient's son at 11:05 AM and left message to discuss recommendation for patient to go to SNF. CSW will continue to follow. FL2 and PASRR will still be completed, but patient's information cannot be faxed out without her permission and understanding of SNF search. CSW waits to hear back from patient's son.   Roddie Mc, [place], [place], [phone]

## 2013-05-07 NOTE — Clinical Social Work Note (Signed)
CSW has made second attempt at contacting patient's son Sharon [name] at 12:29 PM. Another message left for son. CSW will continue to follow.   Roddie Mc, [place], [place], [phone]

## 2013-05-07 NOTE — Progress Notes (Signed)
PATIENT DETAILS Name: Sharon [name] Age: 79 y.o. Sex: female Date of Birth: 1915/12/28 Admit Date: 05/02/2013 Admitting Physician Therisa Doyne, MD WUJ:WJXBJ,YNWGNF, MD  Subjective: No major complaints  Assessment/Plan:   NSTEMI (non-ST elevated myocardial infarction) - Seen by cardiology, plan is to manage medically. Currently chest pain-free - Continue with aspirin, statin, beta blocker and nitrates. - Echocardiogram shows EF of 35-40% with diffuse hypokinesis  Acute respiratory failure - Secondary to acute systolic heart failure and COPD exacerbation - Initially on admission required BiPAP, however now weaned off BiPAP and on oxygen via nasal cannula - Continue with steroids, nebulized bronchodilators, Levaquin, since creatinine continues to downtrend- and BNP significantly elevated-will give one dose of Lasix  Acute systolic heart failure - Not a candidate for ACE inhibitor given advanced chronic kidney disease - Continue beta blockers - Start Lasix today-watch renal function,spoke with son [name] on 10/12-updated him regarding the situation. He is aware that patient is overloaded and needs IV Lasix, but is aware that renal function can further deteriorate and patient is not a candidate for Hemodialysis  Acute on chronic renal failure stage IV - Thankfully creatinine now down trending, continue to monitor electrolytes, not a candidate for hemodialysis. Starting Lasix, monitor lytes  Diabetes -c/w  Lantus to 32 units - Continue SSI - Suspect better glycemic control once steroids continued to be weaned off  Hypertension - Moderate control - Continue beta blocker, Imdur  Restless leg syndrome - Continue Mirapex  Disposition: Remain inpatient-suspect discharge to SNF in next 2-3 days  DVT Prophylaxis: Prophylactic Lovenox   Code Status: DNR  Family Communication Son-Clyde Lizarraga-phone 10/12  Procedures:  None  CONSULTS:   cardiology   MEDICATIONS: Scheduled Meds: . albuterol  2.5 mg Nebulization Q6H  . aspirin EC  81 mg Oral Daily  . atorvastatin  40 mg Oral q1800  . budesonide (PULMICORT) nebulizer solution  0.25 mg Nebulization BID  . enoxaparin (LOVENOX) injection  30 mg Subcutaneous Q24H  . furosemide  80 mg Intravenous Once  . insulin aspart  0-15 Units Subcutaneous TID WC  . insulin aspart  0-5 Units Subcutaneous QHS  . insulin glargine  32 Units Subcutaneous QHS  . ipratropium  0.5 mg Nebulization Q6H  . isosorbide mononitrate  30 mg Oral Daily  . levofloxacin  250 mg Oral Daily  . metoprolol tartrate  12.5 mg Oral BID  . polyethylene glycol  17 g Oral Daily  . pramipexole  0.5 mg Oral QHS  . [START ON 05/08/2013] predniSONE  40 mg Oral Q breakfast  . ranolazine  500 mg Oral BID  . sodium chloride  3 mL Intravenous Q12H  . sodium chloride  3 mL Intravenous Q12H   Continuous Infusions:  PRN Meds:.acetaminophen, acetaminophen, albuterol, famotidine, guaifenesin, nitroGLYCERIN, ondansetron (ZOFRAN) IV, ondansetron, traMADol  Antibiotics: Anti-infectives   Start     Dose/Rate Route Frequency Ordered Stop   05/04/13 1400  levofloxacin (LEVAQUIN) tablet 250 mg     250 mg Oral Daily 05/04/13 1228     05/03/13 2200  vancomycin (VANCOCIN) IVPB 1000 mg/200 mL premix  Status:  Discontinued     1,000 mg 200 mL/hr over 60 Minutes Intravenous Every 48 hours 05/02/13 1341 05/02/13 1749   05/03/13 0600  ceFEPIme (MAXIPIME) 1 g in dextrose 5 % 50 mL IVPB  Status:  Discontinued     1 g 100 mL/hr over 30 Minutes Intravenous Every 24 hours 05/02/13 0632 05/02/13 1749   05/02/13 2200  vancomycin (VANCOCIN) IVPB 1000  mg/200 mL premix  Status:  Discontinued     1,000 mg 200 mL/hr over 60 Minutes Intravenous Every 24 hours 05/02/13 0631 05/02/13 1341   05/02/13 0630  ceFEPIme (MAXIPIME) 1 g in dextrose 5 % 50 mL IVPB     1 g 100 mL/hr over 30 Minutes Intravenous  Once 05/02/13 0620 05/02/13 0709   05/02/13  0630  vancomycin (VANCOCIN) IVPB 1000 mg/200 mL premix  Status:  Discontinued     1,000 mg 200 mL/hr over 60 Minutes Intravenous  Once 05/02/13 0620 05/02/13 0630       PHYSICAL EXAM: Vital signs in last 24 hours: Filed Vitals:   05/06/13 2204 05/07/13 0304 05/07/13 0521 05/07/13 0931  BP:   142/55   Pulse:   66   Temp:   97 F (36.1 C)   TempSrc:   Oral   Resp:   18   Height:      Weight:   88.587 kg (195 lb 4.8 oz)   SpO2: 95% 93% 95% 94%    Weight change: -0.312 kg (-11 oz) Filed Weights   05/05/13 0620 05/06/13 0429 05/07/13 0521  Weight: 89.268 kg (196 lb 12.8 oz) 88.9 kg (195 lb 15.8 oz) 88.587 kg (195 lb 4.8 oz)   Body mass index is 38.14 kg/(m^2).   Gen Exam: Awake and alert with clear speech.   Neck: Supple, No JVD.   Chest: Few scattered fine bibasilar rales and rhonchi all over CVS: S1 S2 Regular, no murmurs.  Abdomen: soft, BS +, non tender, non distended.  Extremities: 1+ edema, lower extremities warm to touch. Neurologic: Non Focal.   Skin: No Rash.   Wounds: N/A.    Intake/Output from previous day:  Intake/Output Summary (Last 24 hours) at 05/07/13 0953 Last data filed at 05/07/13 0622  Gross per 24 hour  Intake    780 ml  Output   1100 ml  Net   -320 ml     LAB RESULTS: CBC  Recent Labs Lab 05/02/13 0538 05/03/13 0525 05/04/13 0915  WBC 13.0* 9.3 12.4*  HGB 10.8* 9.4* 10.0*  HCT 33.0* 28.4* 29.4*  PLT 201 178 218  MCV 89.4 89.3 88.8  MCH 29.3 29.6 30.2  MCHC 32.7 33.1 34.0  RDW 14.7 15.0 14.6  LYMPHSABS 1.4  --   --   MONOABS 0.8  --   --   EOSABS 0.1  --   --   BASOSABS 0.0  --   --     Chemistries   Recent Labs Lab 05/02/13 0538 05/03/13 0525 05/04/13 0915 05/06/13 0540 05/07/13 0456  NA 142 140 135 136 138  K 4.3 4.3 4.6 4.8 4.9  CL 102 103 100 102 102  CO2 30 25 22 23 26   GLUCOSE 219* 294* 212* 206* 239*  BUN 32* 53* 76* 88* 91*  CREATININE 2.44* 3.14* 3.46* 3.06* 2.99*  CALCIUM 8.4 7.7* 7.8* 7.7* 8.2*     CBG:  Recent Labs Lab 05/06/13 0545 05/06/13 1109 05/06/13 1614 05/06/13 2055 05/07/13 0605  GLUCAP 224* 190* 186* 205* 226*    GFR Estimated Creatinine Clearance: 11.1 ml/min (by C-G formula based on Cr of 2.99).  Coagulation profile No results found for this basename: INR, PROTIME,  in the last 168 hours  Cardiac Enzymes  Recent Labs Lab 05/02/13 1035 05/02/13 1601 05/04/13 1454  TROPONINI 18.43* >20.00* 4.46*    No components found with this basename: POCBNP,  No results found for this basename: DDIMER,  in  the last 72 hours No results found for this basename: HGBA1C,  in the last 72 hours No results found for this basename: CHOL, HDL, LDLCALC, TRIG, CHOLHDL, LDLDIRECT,  in the last 72 hours No results found for this basename: TSH, T4TOTAL, FREET3, T3FREE, THYROIDAB,  in the last 72 hours No results found for this basename: VITAMINB12, FOLATE, FERRITIN, TIBC, IRON, RETICCTPCT,  in the last 72 hours No results found for this basename: LIPASE, AMYLASE,  in the last 72 hours  Urine Studies No results found for this basename: UACOL, UAPR, USPG, UPH, UTP, UGL, UKET, UBIL, UHGB, UNIT, UROB, ULEU, UEPI, UWBC, URBC, UBAC, CAST, CRYS, UCOM, BILUA,  in the last 72 hours  MICROBIOLOGY: Recent Results (from the past 240 hour(s))  MRSA PCR SCREENING     Status: None   Collection Time    05/02/13  4:59 AM      Result Value Range Status   MRSA by PCR NEGATIVE  NEGATIVE Final   Comment:            The GeneXpert MRSA Assay (FDA     approved for NASAL specimens     only), is one component of a     comprehensive MRSA colonization     surveillance program. It is not     intended to diagnose MRSA     infection nor to guide or     monitor treatment for     MRSA infections.    RADIOLOGY STUDIES/RESULTS: Dg Chest Port 1 View  05/02/2013   CLINICAL DATA:  Short of breath  EXAM: PORTABLE CHEST - 1 VIEW  COMPARISON:  None.  FINDINGS: Cardiac silhouette is normal in size.  Mediastinum is normal in contour.  Medial lung base opacity is noted bilaterally, likely atelectasis. There is central interstitial thickening. Mild reticular opacities are noted in the left mid lung peripherally. No overt pulmonary edema or convincing infiltrate. No pleural effusion or pneumothorax is seen.  IMPRESSION: Central interstitial thickening with medial lung base atelectasis. No overt edema or convincing infiltrate.   Electronically Signed   By: Amie Portland M.D.   On: 05/02/2013 07:47    Jeoffrey Massed, MD  [facility] Pager:[phone]  If 7PM-7AM, please contact night-coverage [email or web address] Password [email or web address] 05/07/2013, 9:53 AM   LOS: 5 days

## 2013-05-08 DIAGNOSIS — I5021 Acute systolic (congestive) heart failure: Secondary | ICD-10-CM

## 2013-05-08 LAB — GLUCOSE, CAPILLARY: Glucose-Capillary: 111 mg/dL — ABNORMAL HIGH (ref 70–99)

## 2013-05-08 LAB — BASIC METABOLIC PANEL
BUN: 83 mg/dL — ABNORMAL HIGH (ref 6–23)
CO2: 28 mEq/L (ref 19–32)
Chloride: 104 mEq/L (ref 96–112)
Creatinine, Ser: 2.6 mg/dL — ABNORMAL HIGH (ref 0.50–1.10)
GFR calc Af Amer: 17 mL/min — ABNORMAL LOW (ref >90)

## 2013-05-08 MED ORDER — IPRATROPIUM BROMIDE 0.02 % IN SOLN
0.5000 mg | Freq: Three times a day (TID) | RESPIRATORY_TRACT | Status: DC
  Administered 2013-05-08: 09:00:00 0.5 mg via RESPIRATORY_TRACT
  Filled 2013-05-08 (×2): qty 2.5

## 2013-05-08 MED ORDER — INSULIN ASPART 100 UNIT/ML ~~LOC~~ SOLN: SUBCUTANEOUS | Status: DC

## 2013-05-08 MED ORDER — FUROSEMIDE 40 MG PO TABS: 60.0000 mg | ORAL_TABLET | Freq: Two times a day (BID) | ORAL | Status: AC

## 2013-05-08 MED ORDER — ATORVASTATIN CALCIUM 40 MG PO TABS: 40.0000 mg | ORAL_TABLET | Freq: Every day | ORAL | Status: AC

## 2013-05-08 MED ORDER — ISOSORBIDE MONONITRATE ER 30 MG PO TB24: 30.0000 mg | ORAL_TABLET | Freq: Every day | ORAL | Status: AC

## 2013-05-08 MED ORDER — FAMOTIDINE 20 MG PO TABS: 20.0000 mg | ORAL_TABLET | Freq: Two times a day (BID) | ORAL | Status: AC | PRN

## 2013-05-08 MED ORDER — ALBUTEROL SULFATE (5 MG/ML) 0.5% IN NEBU
2.5000 mg | INHALATION_SOLUTION | Freq: Three times a day (TID) | RESPIRATORY_TRACT | Status: DC
  Administered 2013-05-08: 2.5 mg via RESPIRATORY_TRACT
  Filled 2013-05-08 (×2): qty 0.5

## 2013-05-08 MED ORDER — PREDNISONE 10 MG PO TABS: ORAL_TABLET | ORAL | Status: DC

## 2013-05-08 MED ORDER — TRAMADOL HCL 50 MG PO TABS: 50.0000 mg | ORAL_TABLET | Freq: Three times a day (TID) | ORAL | Status: AC | PRN

## 2013-05-08 MED ORDER — BUDESONIDE 0.25 MG/2ML IN SUSP: 0.2500 mg | Freq: Two times a day (BID) | RESPIRATORY_TRACT | Status: DC

## 2013-05-08 MED ORDER — INSULIN GLARGINE 100 UNIT/ML ~~LOC~~ SOLN
28.0000 [IU] | Freq: Every day | SUBCUTANEOUS | Status: DC
  Filled 2013-05-08: qty 0.28

## 2013-05-08 MED ORDER — ALBUTEROL SULFATE (5 MG/ML) 0.5% IN NEBU: 2.5000 mg | INHALATION_SOLUTION | RESPIRATORY_TRACT | Status: DC | PRN

## 2013-05-08 MED ORDER — IPRATROPIUM BROMIDE 0.02 % IN SOLN: 0.5000 mg | Freq: Four times a day (QID) | RESPIRATORY_TRACT | Status: DC

## 2013-05-08 MED ORDER — FUROSEMIDE 10 MG/ML IJ SOLN
80.0000 mg | Freq: Once | INTRAMUSCULAR | Status: AC
  Administered 2013-05-08: 80 mg via INTRAVENOUS
  Filled 2013-05-08: qty 8

## 2013-05-08 MED ORDER — ALBUTEROL SULFATE (2.5 MG/3ML) 0.083% IN NEBU: 2.5000 mg | INHALATION_SOLUTION | Freq: Four times a day (QID) | RESPIRATORY_TRACT | Status: DC

## 2013-05-08 NOTE — Discharge Summary (Signed)
PATIENT DETAILS Name: Sharon [name] Age: 79 y.o. Sex: female Date of Birth: [birth year] MRN: [id number]. Admit Date: 05/02/2013 Admitting Physician: Therisa Doyne, MD NWG:NFAOZ,HYQMVH, MD  Recommendations for Outpatient Follow-up:  1. Monitor renal function closely 2. Please get palliative care consult while at the SNF 3. DNR 4. Check daily weights and adjust dosing of lasix accordingly  PRIMARY DISCHARGE DIAGNOSIS:  Principal Problem:   NSTEMI (non-ST elevated myocardial infarction) Active Problems:   Coronary artery disease, occlusive   Hypertension   Uncontrolled secondary diabetes mellitus with stage 4 CKD (GFR 15-29)   Acute on Chronic kidney disease (CKD), stage IV (severe)   GERD (gastroesophageal reflux disease)   Acute respiratory failure   Acute Systolic CHF (congestive heart failure)      PAST MEDICAL HISTORY: Past Medical History  Diagnosis Date  . Diabetes mellitus, type 2   . Diabetic nephropathy   . GERD (gastroesophageal reflux disease)   . Essential hypertension, benign   . Coronary atherosclerosis of native coronary artery     a. 2012 Cath: LM 40, LAD 70p, nonobs RCA dzs-->med managed;  b. 06/2011 Myoview: mod-large zone mod-sev rev defect of ant/antlat wall, EF 70%;  c. 01/2012 Echo: EF >55%, mod dil LA, mild TR.  Marland Kitchen Retroperitoneal bleed     a. in setting of plavix Rx in past.  . Osteoarthritis     with chronic back pain  . Osteopenia   . Urge incontinence     mild  . History of CVA (cerebrovascular accident) 53  . Vitamin D deficiency   . Hyperlipidemia   . Vertigo   . Restless leg syndrome   . Anemia   . Hiatal hernia   . Venous stasis     DISCHARGE MEDICATIONS:   Medication List    STOP taking these medications       moxifloxacin 400 MG tablet  Commonly known as:  AVELOX      TAKE these medications       albuterol (2.5 MG/3ML) 0.083% nebulizer solution  Commonly known as:  PROVENTIL  Take 3 mLs (2.5 mg total) by  nebulization 4 (four) times daily.     albuterol (5 MG/ML) 0.5% nebulizer solution  Commonly known as:  PROVENTIL  Take 0.5 mLs (2.5 mg total) by nebulization every 2 (two) hours as needed for wheezing.     alum & mag hydroxide-simeth 200-200-20 MG/5ML suspension  Commonly known as:  MAALOX/MYLANTA  Take 15 mLs by mouth every 6 (six) hours as needed for indigestion.     aspirin EC 81 MG tablet  Take 81 mg by mouth every evening.     atorvastatin 40 MG tablet  Commonly known as:  LIPITOR  Take 1 tablet (40 mg total) by mouth daily at 6 PM.     budesonide 0.25 MG/2ML nebulizer solution  Commonly known as:  PULMICORT  Take 2 mLs (0.25 mg total) by nebulization 2 (two) times daily.     famotidine 20 MG tablet  Commonly known as:  PEPCID  Take 1 tablet (20 mg total) by mouth 2 (two) times daily as needed for heartburn.     furosemide 40 MG tablet  Commonly known as:  LASIX  Take 1.5 tablets (60 mg total) by mouth 2 (two) times daily.     guaiFENesin-dextromethorphan 100-10 MG/5ML syrup  Commonly known as:  ROBITUSSIN DM  Take 5 mLs by mouth 3 (three) times daily as needed for cough.     insulin aspart 100 UNIT/ML injection  Commonly known as:  novoLOG  - 0-15 Units, Subcutaneous, 3 times daily with meals  - CBG < 70: implement hypoglycemia protocol  - CBG 70 - 120: 0 units  - CBG 121 - 150: 2 units  - CBG 151 - 200: 3 units  - CBG 201 - 250: 5 units  - CBG 251 - 300: 8 units  - CBG 301 - 350: 11 units  - CBG 351 - 400: 15 units  - CBG > 400: call MD     insulin glargine 100 UNIT/ML injection  Commonly known as:  LANTUS  Inject 0.25 mLs (25 Units total) into the skin at bedtime.     ipratropium 0.02 % nebulizer solution  Commonly known as:  ATROVENT  Take 2.5 mLs (0.5 mg total) by nebulization 4 (four) times daily.     isosorbide mononitrate 30 MG 24 hr tablet  Commonly known as:  IMDUR  Take 1 tablet (30 mg total) by mouth daily.     metoprolol tartrate  25 MG tablet  Commonly known as:  LOPRESSOR  Take 12.5 mg by mouth 2 (two) times daily.     nitroGLYCERIN 0.4 MG SL tablet  Commonly known as:  NITROSTAT  Place 1 tablet (0.4 mg total) under the tongue every 5 (five) minutes as needed for chest pain.     omeprazole 40 MG capsule  Commonly known as:  PRILOSEC  Take 40 mg by mouth every morning.     ondansetron 4 MG tablet  Commonly known as:  ZOFRAN  Take 4 mg by mouth every 8 (eight) hours as needed for nausea (take 30 min before tramadol).     pramipexole 0.25 MG tablet  Commonly known as:  MIRAPEX  Take 0.5-1 mg by mouth at bedtime as needed (may take additional tablet at bedtime as needed for restless legs).     predniSONE 10 MG tablet  Commonly known as:  DELTASONE  - Take 4 tablets daily for 2 days, then,   - Take 3 tablets daily for 2 days, then,   - Take 2 tablets daily for 2 days, then,   - Take 1 tablet daily for 1 day and then stop     ranolazine 500 MG 12 hr tablet  Commonly known as:  RANEXA  Take 1 tablet (500 mg total) by mouth 2 (two) times daily. Take and additional dose twice daily as needed for chest pain     SYSTANE OP  Place 1 drop into the left eye 4 (four) times daily as needed (for 2 days only after monthly eye injection).     traMADol 50 MG tablet  Commonly known as:  ULTRAM  Take 1 tablet (50 mg total) by mouth every 8 (eight) hours as needed.     Vitamin D (Ergocalciferol) 50000 UNITS Caps capsule  Commonly known as:  DRISDOL  Take 50,000 Units by mouth every 30 (thirty) days. 18th of each month        ALLERGIES:   Allergies  Allergen Reactions  . Codeine     Nausea & vomiting  . Ezetimibe   . Hydrocodone Bitartrate   . Latex   . Levaquin [Levofloxacin] Nausea And Vomiting  . Naproxen     BRIEF HPI:  See H&P, Labs, Consult and Test reports for all details in brief,Sharon [name] is a 79 y.o. female with known history of CAD and being treated conservatively as surgery was  deferred due to her chronic kidney disease, diabetes mellitus  was brought to the ER at [facility] due to acute shortness of breath. In the ER patient had a chest x-ray which has per the ER physician nose showed pulmonary edema and was placed on BiPAP. Patient was given Lasix 40 mg IV along with vancomycin and Zosyn. Critical care at [facility] cone was initially consulted. Since patient was eventually able to be weaned off BiPAP patient was transferred to step down under hospitalist care. Patient states that over the last 2 days she's been having shortness of breath with wheezing and cough. At the nursing home patient was placed on steroids and Avelox despite which patient's symptoms did not improve. Denies any chest pain nausea vomiting abdominal pain diarrhea. In the ER at [facility] patient's labs showed mildly elevated troponin at 0.4. Patient's EKG was showing LBBB which as per the ER physician was chronic. BNP was around 7000.  CONSULTATIONS:   cardiology  PERTINENT RADIOLOGIC STUDIES: Dg Chest Port 1 View  05/07/2013   CLINICAL DATA:  Short of breath.  EXAM: PORTABLE CHEST - 1 VIEW  COMPARISON:  05/02/2013  FINDINGS: Irregular interstitial thickening in the right upper lobe, left perihilar region and medial lung bases bilaterally is essentially stable. No new lung opacities.  The cardiac silhouette is normal in size. The mediastinum is normal in contour.  IMPRESSION: 1. No change from the prior exam. Areas of irregular interstitial thickening and medial lung base atelectasis are again noted without overt edema or convincing infiltrate.   Electronically Signed   By: Amie Portland M.D.   On: 05/07/2013 08:52   Dg Chest Port 1 View  05/02/2013   CLINICAL DATA:  Short of breath  EXAM: PORTABLE CHEST - 1 VIEW  COMPARISON:  None.  FINDINGS: Cardiac silhouette is normal in size. Mediastinum is normal in contour.  Medial lung base opacity is noted bilaterally, likely atelectasis. There is  central interstitial thickening. Mild reticular opacities are noted in the left mid lung peripherally. No overt pulmonary edema or convincing infiltrate. No pleural effusion or pneumothorax is seen.  IMPRESSION: Central interstitial thickening with medial lung base atelectasis. No overt edema or convincing infiltrate.   Electronically Signed   By: Amie Portland M.D.   On: 05/02/2013 07:47     PERTINENT LAB RESULTS: CBC: No results found for this basename: WBC, HGB, HCT, PLT,  in the last 72 hours CMET CMP     Component Value Date/Time   NA 140 05/08/2013 0440   K 4.2 05/08/2013 0440   CL 104 05/08/2013 0440   CO2 28 05/08/2013 0440   GLUCOSE 62* 05/08/2013 0440   BUN 83* 05/08/2013 0440   CREATININE 2.60* 05/08/2013 0440   CREATININE 2.24* 10/26/2011 0925   CALCIUM 8.7 05/08/2013 0440   CALCIUM 9.1 09/21/2012 1331   PROT 6.0 05/02/2013 0538   ALBUMIN 2.7* 05/02/2013 0538   AST 51* 05/02/2013 0538   ALT 12 05/02/2013 0538   ALKPHOS 93 05/02/2013 0538   BILITOT 0.7 05/02/2013 0538   GFRNONAA 15* 05/08/2013 0440   GFRAA 17* 05/08/2013 0440    GFR Estimated Creatinine Clearance: 12.8 ml/min (by C-G formula based on Cr of 2.6). No results found for this basename: LIPASE, AMYLASE,  in the last 72 hours No results found for this basename: CKTOTAL, CKMB, CKMBINDEX, TROPONINI,  in the last 72 hours No components found with this basename: POCBNP,  No results found for this basename: DDIMER,  in the last 72 hours No results found for this basename:  HGBA1C,  in the last 72 hours No results found for this basename: CHOL, HDL, LDLCALC, TRIG, CHOLHDL, LDLDIRECT,  in the last 72 hours No results found for this basename: TSH, T4TOTAL, FREET3, T3FREE, THYROIDAB,  in the last 72 hours No results found for this basename: VITAMINB12, FOLATE, FERRITIN, TIBC, IRON, RETICCTPCT,  in the last 72 hours Coags: No results found for this basename: PT, INR,  in the last 72 hours Microbiology: Recent Results  (from the past 240 hour(s))  MRSA PCR SCREENING     Status: None   Collection Time    05/02/13  4:59 AM      Result Value Range Status   MRSA by PCR NEGATIVE  NEGATIVE Final   Comment:            The GeneXpert MRSA Assay (FDA     approved for NASAL specimens     only), is one component of a     comprehensive MRSA colonization     surveillance program. It is not     intended to diagnose MRSA     infection nor to guide or     monitor treatment for     MRSA infections.  URINE CULTURE     Status: None   Collection Time    05/06/13  2:09 AM      Result Value Range Status   Specimen Description URINE, CLEAN CATCH   Final   Special Requests NONE   Final   Culture  Setup Time     Final   Value: 05/06/2013 16:40     Performed at [facility] Count     Final   Value: NO GROWTH     Performed at [facility]   Culture     Final   Value: NO GROWTH     Performed at [facility]   Report Status 05/07/2013 FINAL   Final     BRIEF HOSPITAL COURSE:   Principal Problem: NSTEMI (non-ST elevated myocardial infarction)  - Seen by cardiology, plan is to manage medically. Currently chest pain-free  - Continue with aspirin, statin, beta blocker and nitrates.  - Echocardiogram shows EF of 35-40% with diffuse hypokinesis -if further episodes of NSTEMI/Chest pain occur-will need to transition to comfort care-for now optimize maximal medical management-family-son-Clyde aware  Active Problems: Acute respiratory failure  - Secondary to acute systolic heart failure and COPD exacerbation  - Initially on admission required BiPAP, however now weaned off BiPAP and on oxygen via nasal cannula-this am now down to 2 L -continue with tapering prednisone, lasix and nebulized bronchodilators. Has completed 5 days of Levaquin, hence will not discharge on further antibiotics  Acute systolic heart failure  - Not a candidate for ACE inhibitor given advanced chronic kidney disease   - Continue beta blockers, initially given worsening renal function lasix dosing held, with improvement given 80 mg of IV Lasix on 10/12 and 10/13, on discharge will transition to Lasix 60 mg BID.  -Discharge weight is 88.2 kg please monitor weight closely, lytes closely and adjust dosing of diuretics accordingly  Acute on chronic renal failure stage IV -suspect this was pre-renal from CHF-with supportive care this has now improved from a peak of 3.46 tp 2.60 on discharge (baseline 2.2-2.4) -please monitor weight closely, lytes closely and adjust dosing of diuretics accordingly -is not a candidate for Dialysis-if renal function worsens will need Hospice/Palliative care-family-son [facility]   aware    DM -did have uncontrolled  DM from being on steroids -now on Lantus 28 units and SSI  Hypertension  - Moderate control-further optimization can be done in the outpatient setting - Continue beta blocker, Imdur   Restless leg syndrome  - Continue Mirapex  TODAY-DAY OF DISCHARGE:  Subjective:   Sharon [name] today has no headache,no chest abdominal pain,no new weakness tingling or numbness, feels much better wants to go home today. Is breathing much better, O2 down to 2L/min  Objective:   Blood pressure 155/49, pulse 69, temperature 97.8 F (36.6 C), temperature source Oral, resp. rate 20, height 5' (1.524 m), weight 88.2 kg (194 lb 7.1 oz), SpO2 100.00%.  Intake/Output Summary (Last 24 hours) at 05/08/13 0834 Last data filed at 05/08/13 0630  Gross per 24 hour  Intake   1260 ml  Output   1000 ml  Net    260 ml   Filed Weights   05/06/13 0429 05/07/13 0521 05/08/13 0600  Weight: 88.9 kg (195 lb 15.8 oz) 88.587 kg (195 lb 4.8 oz) 88.2 kg (194 lb 7.1 oz)    Exam Awake Alert, Oriented *3, No new F.N deficits, Normal affect [place].AT,PERRAL Supple Neck,No JVD, No cervical lymphadenopathy appriciated.  Symmetrical Chest wall movement, Good air movement bilaterally, CTAB RRR,No  Gallops,Rubs or new Murmurs, No Parasternal Heave +ve B.Sounds, Abd Soft, Non tender, No organomegaly appriciated, No rebound -guarding or rigidity. No Cyanosis, Clubbing or edema, No new Rash or bruise  DISCHARGE CONDITION: Stable  DISPOSITION: SNF  CODE STATUS DNR  DISCHARGE INSTRUCTIONS:    Activity:  As tolerated with Full fall precautions use walker/cane & assistance as needed Consider Palliative care consult while at the SNF  Diet recommendation: Diabetic Diet Heart Healthy diet Fluid restriction 1.5 lit/day Aspiration precautions:yes/No      Discharge Orders   Future Appointments Provider Department Dept Phone   05/24/2013 9:45 AM Max Maud Deed, DPM [facility] [phone]   06/27/2013 9:30 AM [facility] PRIMARY CARE [place] [phone]   07/04/2013 9:45 AM Sherlene Shams, MD [facility] PRIMARY CARE [place] [phone]   Future Orders Complete By Expires   (HEART FAILURE PATIENTS) Call MD:  Anytime you have any of the following symptoms: 1) 3 pound weight gain in 24 hours or 5 pounds in 1 week 2) shortness of breath, with or without a dry hacking cough 3) swelling in the hands, feet or stomach 4) if you have to sleep on extra pillows at night in order to breathe.  As directed    Call MD for:  difficulty breathing, headache or visual disturbances  As directed    Call MD for:  severe uncontrolled pain  As directed    Diet - low sodium heart healthy  As directed    Diet Carb Modified  As directed    Increase activity slowly  As directed       Follow-up Information   Follow up with Duncan Dull, MD. Schedule an appointment as soon as possible for a visit in 1 week.   Specialty:  Internal Medicine   Contact information:   [address] [place] [address] [phone]         Total Time spent on discharge equals 45 minutes.  SignedJeoffrey Massed 05/08/2013 8:34 AM

## 2013-05-08 NOTE — Progress Notes (Signed)
Physical Therapy Treatment Patient Details Name: Sharon [name] ZOXWRUEAV MRN: [id number] DOB: [birth year] Today's Date: 05/08/2013 Time: 7829-5621 PT Time Calculation (min): 34 min  PT Assessment / Plan / Recommendation  History of Present Illness Pt admit with CHF and NSTEMI.   PT Comments   Pt admitted with Above. Pt currently with functional limitations due to continued endurance and balance deficits.  Pt will benefit from skilled PT to increase their independence and safety with mobility to allow discharge to the venue listed below.   Follow Up Recommendations  SNF;Supervision/Assistance - 24 hour                 Equipment Recommendations  None recommended by PT        Frequency Min 3X/week   Progress towards PT Goals Progress towards PT goals: Progressing toward goals  Plan Current plan remains appropriate    Precautions / Restrictions Precautions Precautions: Fall Restrictions Weight Bearing Restrictions: No   Pertinent Vitals/Pain Desat to 88% upon sitting down after ambulation on RA but sats to 93% within seconds of sitting.  Replaced 3LO2 as upon arrival.  No pain   Mobility  Bed Mobility Bed Mobility: Not assessed Transfers Transfers: Sit to Stand;Stand to Sit Sit to Stand: 4: Min assist;With upper extremity assist;With armrests;From chair/3-in-1 Stand to Sit: 4: Min assist;With upper extremity assist;To chair/3-in-1;With armrests Details for Transfer Assistance: Pt needed cues for hand placement for sit to stand and stand to sit.  Needed steadying assist upon standing. Pt began to urinate before she could get on 3N1 that was placed right beside her prior to her standing.  Had to clean pt up and change socks and gown.   Ambulation/Gait Ambulation/Gait Assistance: 4: Min assist Ambulation Distance (Feet): 150 Feet Assistive device: Rolling walker Ambulation/Gait Assistance Details: Pt ambulated with RW with min assist and cues.  Instability continues in bil knees  needing steadying assist.   Cues for postural stability and to stay close to RW.  Needed asssit to move the RW as well.  Pt O2 sat at rest on 3L 95%.  Ambulated without O2 with sat >90% and greater.  Desat to 88% once seated but came back to 94% within seconds.  Replaced O2 at 3 LO2.   Gait Pattern: Step-through pattern;Decreased stride length;Wide base of support;Trunk flexed Gait velocity: decreased Stairs: No Wheelchair Mobility Wheelchair Mobility: No    Exercises General Exercises - Lower Extremity Ankle Circles/Pumps: AROM;10 reps;Seated;Both Long Arc Quad: AROM;Both;10 reps;Seated Hip Flexion/Marching: AROM;Both;10 reps;Seated    PT Goals (current goals can now be found in the care plan section)    Visit Information  Last PT Received On: 05/08/13 Assistance Needed: +1 History of Present Illness: Pt admit with CHF and NSTEMI.    Subjective Data  Subjective: "I always have to use the bathroom."   Cognition  Cognition Arousal/Alertness: Awake/alert Behavior During Therapy: WFL for tasks assessed/performed Overall Cognitive Status: History of cognitive impairments - at baseline    Balance  Static Standing Balance Static Standing - Balance Support: Bilateral upper extremity supported;During functional activity Static Standing - Level of Assistance: 4: Min assist Static Standing - Comment/# of Minutes: 3 High Level Balance High Level Balance Activites: Turns;Direction changes High Level Balance Comments: Has difficulty with turns and direction changes needing min steadying assist with RW.    End of Session PT - End of Session Equipment Utilized During Treatment: Gait belt;Oxygen Activity Tolerance: Patient limited by fatigue Patient left: in chair;with call bell/phone within reach Nurse  Communication: Mobility status       INGOLD,Brees Hounshell 05/08/2013, 2:07 PM [facility] [phone] (pager)

## 2013-05-08 NOTE — Progress Notes (Signed)
DC IV, DC Tele, DC Home. Discharge instructions and home medications discussed with patient. Patient denied any questions or concerns at this time. Patient leaving unit via EMS with O2 and appears in no acute distress.

## 2013-05-11 DIAGNOSIS — E1129 Type 2 diabetes mellitus with other diabetic kidney complication: Secondary | ICD-10-CM

## 2013-05-11 DIAGNOSIS — I5022 Chronic systolic (congestive) heart failure: Secondary | ICD-10-CM

## 2013-05-11 DIAGNOSIS — N184 Chronic kidney disease, stage 4 (severe): Secondary | ICD-10-CM

## 2013-05-11 NOTE — Clinical Social Work Placement (Signed)
     Clinical Social Work Department CLINICAL SOCIAL WORK PLACEMENT NOTE 05/11/2013  Patient:  Sharon [name], Sharon [name]  Account Number:  [id number] Admit date:  05/02/2013  Clinical Social Worker:  Cherre Blanc, [place]  Date/time:  05/07/2013 04:25 PM  Clinical Social Work is seeking post-discharge placement for this patient at the following level of care:   SKILLED NURSING   (*CSW will update this form in Epic as items are completed)   05/07/2013  Patient/family provided with [facility] Department of Clinical Social Works list of facilities offering this level of care within the geographic area requested by the patient (or if unable, by the patients family).  05/07/2013  Patient/family informed of their freedom to choose among providers that offer the needed level of care, that participate in Medicare, Medicaid or managed care program needed by the patient, have an available bed and are willing to accept the patient.  05/07/2013  Patient/family informed of [facility] ownership interest in [facility], as well as of the fact that they are under no obligation to receive care at this facility.  PASARR submitted to EDS on  PASARR number received from EDS on 05/07/2013  FL2 transmitted to all facilities in geographic area requested by pt/family on  05/07/2013 FL2 transmitted to all facilities within larger geographic area on   Patient informed that his/her managed care company has contracts with or will negotiate with  certain facilities, including the following:     Patient/family informed of bed offers received:  05/08/2013 Patient chooses bed at [facility] Physician recommends and patient chooses bed at    Patient to be transferred to [facility] on  05/08/2013 Patient to be transferred to facility by Ambulance  Sharin Mons)  The following physician request were entered in Epic:   Additional Comments: 05/08/13.  Increased level of care from independent  to SNF for short term rehab. Ok per pateint and family. SNF notified and has a SNF bed.  Nursing notified and will call report. No further CSW needs identified.CSW signing off. Lorri Frederick. Jayland Null, LCSWA [phone]

## 2013-05-17 ENCOUNTER — Ambulatory Visit: Payer: MEDICARE | Admitting: Internal Medicine

## 2013-05-17 DIAGNOSIS — R05 Cough: Secondary | ICD-10-CM

## 2013-05-17 DIAGNOSIS — I509 Heart failure, unspecified: Secondary | ICD-10-CM

## 2013-05-24 ENCOUNTER — Ambulatory Visit: Payer: Self-pay | Admitting: Podiatry

## 2013-05-29 ENCOUNTER — Encounter: Payer: Self-pay | Admitting: Internal Medicine

## 2013-05-29 DIAGNOSIS — I5020 Unspecified systolic (congestive) heart failure: Secondary | ICD-10-CM | POA: Insufficient documentation

## 2013-06-01 ENCOUNTER — Ambulatory Visit: Payer: MEDICARE | Admitting: Cardiovascular Disease

## 2013-06-01 ENCOUNTER — Encounter: Payer: Self-pay | Admitting: Cardiovascular Disease

## 2013-06-01 ENCOUNTER — Ambulatory Visit (INDEPENDENT_AMBULATORY_CARE_PROVIDER_SITE_OTHER): Payer: MEDICARE | Admitting: Cardiovascular Disease

## 2013-06-01 VITALS — BP 167/66 | HR 80 | Ht 60.0 in | Wt 186.8 lb

## 2013-06-01 DIAGNOSIS — I219 Acute myocardial infarction, unspecified: Secondary | ICD-10-CM

## 2013-06-01 DIAGNOSIS — R609 Edema, unspecified: Secondary | ICD-10-CM

## 2013-06-01 DIAGNOSIS — I214 Non-ST elevation (NSTEMI) myocardial infarction: Secondary | ICD-10-CM

## 2013-06-01 DIAGNOSIS — R600 Localized edema: Secondary | ICD-10-CM

## 2013-06-01 DIAGNOSIS — I5023 Acute on chronic systolic (congestive) heart failure: Secondary | ICD-10-CM | POA: Insufficient documentation

## 2013-06-01 DIAGNOSIS — E785 Hyperlipidemia, unspecified: Secondary | ICD-10-CM

## 2013-06-01 DIAGNOSIS — N184 Chronic kidney disease, stage 4 (severe): Secondary | ICD-10-CM

## 2013-06-01 DIAGNOSIS — I5020 Unspecified systolic (congestive) heart failure: Secondary | ICD-10-CM

## 2013-06-01 DIAGNOSIS — E1329 Other specified diabetes mellitus with other diabetic kidney complication: Secondary | ICD-10-CM

## 2013-06-01 DIAGNOSIS — I509 Heart failure, unspecified: Secondary | ICD-10-CM

## 2013-06-01 DIAGNOSIS — J9600 Acute respiratory failure, unspecified whether with hypoxia or hypercapnia: Secondary | ICD-10-CM

## 2013-06-01 DIAGNOSIS — E1322 Other specified diabetes mellitus with diabetic chronic kidney disease: Secondary | ICD-10-CM

## 2013-06-01 DIAGNOSIS — I2510 Atherosclerotic heart disease of native coronary artery without angina pectoris: Secondary | ICD-10-CM

## 2013-06-01 NOTE — Assessment & Plan Note (Addendum)
She sees Dr. Ronn Melena in one month's time

## 2013-06-01 NOTE — Assessment & Plan Note (Signed)
No significant edema on today's visit 

## 2013-06-01 NOTE — Assessment & Plan Note (Deleted)
Recent acute on chronic systolic CHF. Drop in her ejection fraction on recent echocardiogram. We have suggested she continue her Lasix 60 mg twice a day though have suggested she hold her afternoon Lasix for weight less than 183 pounds. We have suggested she call our office for weight 190 pounds or more  

## 2013-06-01 NOTE — Progress Notes (Signed)
Patient ID: Sharon [name], female    DOB: [birth year], 79 y.o.   MRN: [id number]  HPI Comments: Sharon [name] is a very pleasant 79 yo woman with history of coronary artery disease, previously turned down for bypass given renal dysfunction who presented to [facility] February 02 2012 with dizziness, lightheaded, bradycardia. She was found to be in junctional rhythm with heart rates in the 20s to 30s, low blood pressure with systolics in the 80s, potassium 5.5, worsening renal dysfunction consistent with dehydration.  Notes indicated a history of congestive heart failure, cardiac catheterization in [place] several years earlier found to have three-vessel disease.  She is currently living in assisted living at [facility].  Recently sent to the emergency room at [facility] for respiratory distress. She was transferred to [facility] as no beds were available. She had a non-STEMI with peak troponin greater than 20. Cardiology was consult and decision was made for medical management. Her son was part of these discussions. She was initially placed on antibiotics for several days for respiratory distress. These were held as it was fel she did not have a pneumonia. Her diuretic was doubled at the time of discharge. She presents today and reports that overall she is doing well. Some reports that she is still trying to get her strength back. They do not think that she needs oxygen at nighttime as lungs are clear. They do not think that she needs nebulizer treatments anymore. Recently seen by Dr. Ronn Melena. There was some discussion of the dangers of overdiuresis and dehydration.    she is walking well with a walker. Weight at [facility] is 185-186 pounds. Blood pressure 110/50  2D Echocardiogram 10.7.2014 - Left ventricle: The cavity size was normal.Systolic function was moderately reduced. The estimated ejection fraction   was in the range of 35% to 40%. Diffuse hypokinesis.   Doppler parameters are consistent  with abnormal left   ventricular relaxation (grade 1 diastolic dysfunction). - Left atrium: The atrium was mildly dilated. - Pulmonary arteries: PA peak pressure: 39mm Hg (S).  Notes from cardiologist in 2012 detailing cardiac cath in February 2012 with 40% left main disease, 70% proximal LAD disease, nonobstructed RCA disease Stent was not performed. Followup stress test in 2012  EKG shows normal sinus rhythm with rate 80 beats per minute,LBBB    Outpatient Encounter Prescriptions as of 06/01/2013  Medication Sig  . albuterol (PROVENTIL) (2.5 MG/3ML) 0.083% nebulizer solution Take 3 mLs (2.5 mg total) by nebulization 4 (four) times daily.  Marland Kitchen albuterol (PROVENTIL) (5 MG/ML) 0.5% nebulizer solution Take 0.5 mLs (2.5 mg total) by nebulization every 2 (two) hours as needed for wheezing.  Marland Kitchen alum & mag hydroxide-simeth (MAALOX/MYLANTA) 200-200-20 MG/5ML suspension Take 15 mLs by mouth every 6 (six) hours as needed for indigestion.  Marland Kitchen aspirin EC 81 MG tablet Take 81 mg by mouth every evening.  Marland Kitchen atorvastatin (LIPITOR) 40 MG tablet Take 1 tablet (40 mg total) by mouth daily at 6 PM.  . budesonide (PULMICORT) 0.25 MG/2ML nebulizer solution Take 2 mLs (0.25 mg total) by nebulization 2 (two) times daily.  . famotidine (PEPCID) 20 MG tablet Take 1 tablet (20 mg total) by mouth 2 (two) times daily as needed for heartburn.  . furosemide (LASIX) 40 MG tablet Take 1.5 tablets (60 mg total) by mouth 2 (two) times daily.  Marland Kitchen guaiFENesin-dextromethorphan (ROBITUSSIN DM) 100-10 MG/5ML syrup Take 5 mLs by mouth 3 (three) times daily as needed for cough.  . insulin  aspart (NOVOLOG) 100 UNIT/ML injection 0-15 Units, Subcutaneous, 3 times daily with meals CBG < 70: implement hypoglycemia protocol CBG 70 - 120: 0 units CBG 121 - 150: 2 units CBG 151 - 200: 3 units CBG 201 - 250: 5 units CBG 251 - 300: 8 units CBG 301 - 350: 11 units CBG 351 - 400: 15 units CBG > 400: call MD  . insulin glargine (LANTUS) 100  UNIT/ML injection Inject 0.25 mLs (25 Units total) into the skin at bedtime.  Marland Kitchen ipratropium (ATROVENT) 0.02 % nebulizer solution Take 2.5 mLs (0.5 mg total) by nebulization 4 (four) times daily.  . isosorbide mononitrate (IMDUR) 30 MG 24 hr tablet Take 1 tablet (30 mg total) by mouth daily.  . metoprolol tartrate (LOPRESSOR) 25 MG tablet Take 12.5 mg by mouth 2 (two) times daily.  . nitroGLYCERIN (NITROSTAT) 0.4 MG SL tablet Place 1 tablet (0.4 mg total) under the tongue every 5 (five) minutes as needed for chest pain.  Marland Kitchen omeprazole (PRILOSEC) 40 MG capsule Take 40 mg by mouth every morning.  . ondansetron (ZOFRAN) 4 MG tablet Take 4 mg by mouth every 8 (eight) hours as needed for nausea (take 30 min before tramadol).  Bertram Gala Glycol-Propyl Glycol (SYSTANE OP) Place 1 drop into the left eye 4 (four) times daily as needed (for 2 days only after monthly eye injection).  . pramipexole (MIRAPEX) 0.25 MG tablet Take 0.5-1 mg by mouth at bedtime as needed (may take additional tablet at bedtime as needed for restless legs).  . predniSONE (DELTASONE) 10 MG tablet Take 4 tablets daily for 2 days, then,  Take 3 tablets daily for 2 days, then,  Take 2 tablets daily for 2 days, then,  Take 1 tablet daily for 1 day and then stop  . ranolazine (RANEXA) 500 MG 12 hr tablet Take 1 tablet (500 mg total) by mouth 2 (two) times daily. Take and additional dose twice daily as needed for chest pain  . traMADol (ULTRAM) 50 MG tablet Take 1 tablet (50 mg total) by mouth every 8 (eight) hours as needed.  . Vitamin D, Ergocalciferol, (DRISDOL) 50000 UNITS CAPS capsule Take 50,000 Units by mouth every 30 (thirty) days. 18th of each month     Review of Systems  Constitutional: Negative.   HENT: Negative.   Eyes: Negative.   Respiratory: Negative.   Cardiovascular: Negative.   Gastrointestinal: Negative.   Endocrine: Negative.   Musculoskeletal: Negative.   Skin: Negative.   Allergic/Immunologic: Negative.    Neurological: Negative.        Foot tingling  Hematological: Negative.   Psychiatric/Behavioral: Negative.   All other systems reviewed and are negative.   BP 167/66  Ht 5' (1.524 m)  Wt 186 lb 12 oz (84.709 kg)  BMI 36.47 kg/m2  Physical Exam  Nursing note and vitals reviewed. Constitutional: She is oriented to person, place, and time. She appears well-developed and well-nourished.  Elderly woman that walks relatively quickly with a 3 wheeled walker  HENT:  Head: Normocephalic.  Nose: Nose normal.  Mouth/Throat: Oropharynx is clear and moist.  Eyes: Conjunctivae are normal. Pupils are equal, round, and reactive to light.  Neck: Normal range of motion. Neck supple. No JVD present.  Cardiovascular: Normal rate, regular rhythm, S1 normal, S2 normal, normal heart sounds and intact distal pulses.  Exam reveals no gallop and no friction rub.   No murmur heard. Pulmonary/Chest: Effort normal and breath sounds normal. No respiratory distress. She has no wheezes. She  has no rales. She exhibits no tenderness.  Abdominal: Soft. Bowel sounds are normal. She exhibits no distension. There is no tenderness.  Musculoskeletal: Normal range of motion. She exhibits no edema and no tenderness.  Lymphadenopathy:    She has no cervical adenopathy.  Neurological: She is alert and oriented to person, place, and time. Coordination normal.  Skin: Skin is warm and dry. No rash noted. No erythema.  Psychiatric: She has a normal mood and affect. Her behavior is normal. Judgment and thought content normal.    Assessment and Plan

## 2013-06-01 NOTE — Assessment & Plan Note (Signed)
We have suggested that she stay on her statin given her severe coronary artery disease

## 2013-06-01 NOTE — Patient Instructions (Signed)
You are doing well  Please hold afternoon lasix for weight 183 pounds or less Otherwise continue lasix 60 mg  Twice a day Please call the office for weight 190 pounds or more  Please hold the neb treatments Ok to stop oxygen at night  Please call us if you have new issues that need to be addressed before your next appt.  Your physician wants you to follow-up in: 3 months.  You will receive a reminder letter in the mail two months in advance. If you don't receive a letter, please call our office to schedule the follow-up appointment.

## 2013-06-01 NOTE — Assessment & Plan Note (Signed)
Medical management was recommended. Not a good candidate for cardiac catheterization and stenting given her renal dysfunction. On recent admission patient and son did not want intervention

## 2013-06-01 NOTE — Assessment & Plan Note (Signed)
Recent acute on chronic systolic CHF. Drop in her ejection fraction on recent echocardiogram. We have suggested she continue her Lasix 60 mg twice a day though have suggested she hold her afternoon Lasix for weight less than 183 pounds. We have suggested she call our office for weight 190 pounds or more

## 2013-06-01 NOTE — Assessment & Plan Note (Signed)
Etiology of her recent acute respiratory failure was likely secondary to non-STEMI. Unable to exclude arrhythmia. EKG on arrival to [facility] did show wide-complex tachycardia with rate 142 beats per minute( baseline left bundle-branch block)

## 2013-06-01 NOTE — Assessment & Plan Note (Signed)
We have encouraged continued exercise, careful diet management in an effort to lose weight. 

## 2013-06-07 ENCOUNTER — Ambulatory Visit: Payer: Self-pay | Admitting: Podiatry

## 2013-06-07 ENCOUNTER — Telehealth: Payer: Self-pay

## 2013-06-07 NOTE — Telephone Encounter (Signed)
Either our office or Dr. Alphonsus Sias could check basic metabolic panel. If renal function is high suggesting dehydration, could hold Lasix. If electrolytes abnormal, we can correct this based on blood work.  Sounds like she might have delirium, unable to exclude infection. May want Dr. Lowella Bandy to evaluate her.  Dr Alphonsus Sias may need to also  look at medications she is receiving, sleep medications or anxiety medications

## 2013-06-07 NOTE — Telephone Encounter (Signed)
Spoke w/ Angelica Chessman, nurse at [facility], who states that pt has increasing confusion and acting "loopy", which has been increasing over the last 3-4 days. Pt has been eating food off other people's plates, coming to breakfast at 3 in the afternoon , refusing to get dressed, etc. Reports that pt has "been like this before" due to an electrolyte imbalance.  Due to this, pt's son would like to hold her lasix but nurse questions this request. Reports pt's wt is stable, mild wheezing in the upper airways, denies chest pain, BP 110/36. Nurse would appreciate Dr. Windell Hummingbird recommendation. Please advise.  Thank you!!!

## 2013-06-07 NOTE — Telephone Encounter (Signed)
Left detailed message on Mandy's voicemail with instructions and asked her to call back with any questions.

## 2013-06-07 NOTE — Telephone Encounter (Signed)
Nurse from [facility] calling, states pt is having increased lethargy and confusion, also BP is 110/36, having increased SOB, also wheezing.Please call. Pt son is requesting the facility hold her Lasix . Please call and advise

## 2013-06-07 NOTE — Telephone Encounter (Signed)
I discussed the situation with Angelica Chessman RN I will see her tomorrow at her apartment at about 11:45AM and decide on how to proceed

## 2013-06-08 ENCOUNTER — Encounter: Payer: Self-pay | Admitting: Internal Medicine

## 2013-06-08 ENCOUNTER — Non-Acute Institutional Stay: Payer: MEDICARE | Admitting: Internal Medicine

## 2013-06-08 VITALS — BP 130/60 | HR 74 | Temp 98.6°F | Resp 20 | Wt 186.0 lb

## 2013-06-08 DIAGNOSIS — N184 Chronic kidney disease, stage 4 (severe): Secondary | ICD-10-CM

## 2013-06-08 DIAGNOSIS — R404 Transient alteration of awareness: Secondary | ICD-10-CM

## 2013-06-08 DIAGNOSIS — R410 Disorientation, unspecified: Secondary | ICD-10-CM

## 2013-06-08 DIAGNOSIS — I5020 Unspecified systolic (congestive) heart failure: Secondary | ICD-10-CM

## 2013-06-08 NOTE — Assessment & Plan Note (Signed)
CXR shows mild pulmonary vascular congestion but she is clear and normal respiratory rate Will continue bid furosemide unless weight goes down-- or gets confused again Repeat met panel is still pending

## 2013-06-08 NOTE — Assessment & Plan Note (Signed)
Saw Dr Wynelle Link last week and he was happy and didn't make any changes

## 2013-06-08 NOTE — Assessment & Plan Note (Signed)
Confusion and alteration of conciousness Now improved Did have dose of furosemide held yesterday afternoon, and seems to have had a better night last night---not sure why this is better Doesn't appear to have been overdiuresed  Will just observe for now
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
## 2013-06-19 ENCOUNTER — Telehealth: Payer: Self-pay | Admitting: *Deleted

## 2013-06-19 NOTE — Telephone Encounter (Signed)
ONE TOUCH ULTRA TEST STRIPS  #100   FINGERSTICKS TWICE DAILY FOR DIABETES

## 2013-06-20 MED ORDER — GLUCOSE BLOOD VI STRP: ORAL_STRIP | Status: AC

## 2013-06-23 ENCOUNTER — Encounter: Payer: Self-pay | Admitting: Family Medicine

## 2013-06-23 ENCOUNTER — Ambulatory Visit (INDEPENDENT_AMBULATORY_CARE_PROVIDER_SITE_OTHER): Payer: MEDICARE | Admitting: Family Medicine

## 2013-06-23 VITALS — BP 138/64 | HR 96 | Temp 98.9°F | Wt 186.0 lb

## 2013-06-23 DIAGNOSIS — R05 Cough: Secondary | ICD-10-CM

## 2013-06-23 MED ORDER — AMOXICILLIN-POT CLAVULANATE 500-125 MG PO TABS: 1.0000 | ORAL_TABLET | Freq: Two times a day (BID) | ORAL | Status: DC

## 2013-06-23 NOTE — Patient Instructions (Signed)
Start the augmentin today. Take it every 12 hours.   Continue robitussin up to 3 times a day as needed for cough.  Take care.

## 2013-06-23 NOTE — Progress Notes (Signed)
Pre-visit discussion using our clinic review tool. No additional management support is needed unless otherwise documented below in the visit note.  About 5 weeks ago she likely had a URI, then worsened, had an NSTEMI and was admitted to [facility].  She was discharged to SNF, was improving generally until the day before yesterday.  In the meantime, inc in cough. No known fevers.  Tmax known 99.4.  Cough is worse than yesterday.  "I am sick and I don't feel good at all."  Swallowing well.  She hasn't been able to produce much sputum yet.  No ear pain.  Neck is sore but not stiff, "like a crick in my neck."  Rhinorrhea; ST from cough noted.  No vomiting.  No diarrhea.  No rash.  Weight is stable, between 185 and 190.    Meds, vitals, and allergies reviewed.   ROS: See HPI.  Otherwise, noncontributory.  nad ncat Tm wnl Nasal and OP exam w/o erythema Neck supple, no LA rrr ctab except for coarse BS in the RLL, no inc in wob abd soft Ext with trace edema

## 2013-06-24 DIAGNOSIS — R059 Cough, unspecified: Secondary | ICD-10-CM | POA: Insufficient documentation

## 2013-06-24 DIAGNOSIS — R05 Cough: Secondary | ICD-10-CM | POA: Insufficient documentation

## 2013-06-24 NOTE — Assessment & Plan Note (Addendum)
Given her hx, I would treat for presumed early PNA.  We didn't get the CXR as it would likely not change mgmt.  Son agrees. Continue robitussin up to 3 times a day as needed for cough.  Okay for outpatient f/u.  If she worsens, then to ER.  Pt and son agree.  No inc in WOB during exam, no wheeze noted. We used augmentin given the quinolone intolerance. To PCP as FYI.

## 2013-06-26 ENCOUNTER — Encounter: Payer: Self-pay | Admitting: *Deleted

## 2013-06-26 ENCOUNTER — Telehealth: Payer: Self-pay | Admitting: Internal Medicine

## 2013-06-26 NOTE — Telephone Encounter (Signed)
Pt son called to cancel appointment he wanted to let dr Darrick Huntsman know that she is at [facility] and is going to see dr Alphonsus Sias there. He stated it was easier for his mom

## 2013-06-26 NOTE — Telephone Encounter (Signed)
FYI

## 2013-06-27 ENCOUNTER — Other Ambulatory Visit: Payer: MEDICARE

## 2013-07-01 ENCOUNTER — Inpatient Hospital Stay: Payer: Self-pay | Admitting: Specialist

## 2013-07-01 LAB — CK TOTAL AND CKMB (NOT AT ARMC)
CK, Total: 38 U/L (ref 21–215)
CK-MB: 1.2 ng/mL (ref 0.5–3.6)
CK-MB: 1.2 ng/mL (ref 0.5–3.6)
CK-MB: 1 ng/mL (ref 0.5–3.6)

## 2013-07-01 LAB — PRO B NATRIURETIC PEPTIDE: B-Type Natriuretic Peptide: 9744 pg/mL — ABNORMAL HIGH (ref 0–450)

## 2013-07-01 LAB — COMPREHENSIVE METABOLIC PANEL
BUN: 26 mg/dL — ABNORMAL HIGH (ref 7–18)
Calcium, Total: 8.8 mg/dL (ref 8.5–10.1)
Chloride: 101 mmol/L (ref 98–107)
Creatinine: 2.24 mg/dL — ABNORMAL HIGH (ref 0.60–1.30)
EGFR (African American): 21 — ABNORMAL LOW
Glucose: 188 mg/dL — ABNORMAL HIGH (ref 65–99)
SGOT(AST): 25 U/L (ref 15–37)
SGPT (ALT): 20 U/L (ref 12–78)
Total Protein: 6.2 g/dL — ABNORMAL LOW (ref 6.4–8.2)

## 2013-07-01 LAB — URINALYSIS, COMPLETE
Bilirubin,UR: NEGATIVE
Glucose,UR: NEGATIVE mg/dL (ref 0–75)
Hyaline Cast: 3
Ketone: NEGATIVE
Nitrite: POSITIVE
RBC,UR: 8 /HPF (ref 0–5)
Specific Gravity: 1.011 (ref 1.003–1.030)
WBC UR: 68 /HPF (ref 0–5)

## 2013-07-01 LAB — CBC
HCT: 30.5 % — ABNORMAL LOW (ref 35.0–47.0)
HGB: 10.2 g/dL — ABNORMAL LOW (ref 12.0–16.0)
MCH: 29.3 pg (ref 26.0–34.0)
MCV: 88 fL (ref 80–100)
Platelet: 263 10*3/uL (ref 150–440)
RBC: 3.47 10*6/uL — ABNORMAL LOW (ref 3.80–5.20)
WBC: 10.2 10*3/uL (ref 3.6–11.0)

## 2013-07-01 LAB — TROPONIN I
Troponin-I: 0.28 ng/mL — ABNORMAL HIGH
Troponin-I: 0.24 ng/mL — ABNORMAL HIGH
Troponin-I: 0.24 ng/mL — ABNORMAL HIGH

## 2013-07-01 LAB — APTT: Activated PTT: >160 secs (ref 23.6–35.9)

## 2013-07-01 LAB — LIPID PANEL
Ldl Cholesterol, Calc: 38 mg/dL (ref 0–100)
Triglycerides: 174 mg/dL (ref 0–200)

## 2013-07-02 DIAGNOSIS — R079 Chest pain, unspecified: Secondary | ICD-10-CM

## 2013-07-02 DIAGNOSIS — R7989 Other specified abnormal findings of blood chemistry: Secondary | ICD-10-CM

## 2013-07-02 DIAGNOSIS — I5022 Chronic systolic (congestive) heart failure: Secondary | ICD-10-CM

## 2013-07-02 DIAGNOSIS — I059 Rheumatic mitral valve disease, unspecified: Secondary | ICD-10-CM

## 2013-07-02 LAB — CBC WITH DIFFERENTIAL/PLATELET
Basophil %: 1 %
Eosinophil %: 6.7 %
HCT: 30.2 % — ABNORMAL LOW (ref 35.0–47.0)
HGB: 9.9 g/dL — ABNORMAL LOW (ref 12.0–16.0)
Lymphocyte #: 3 10*3/uL (ref 1.0–3.6)
Lymphocyte %: 29.9 %
MCH: 28.5 pg (ref 26.0–34.0)
MCHC: 32.6 g/dL (ref 32.0–36.0)
MCV: 87 fL (ref 80–100)
Monocyte %: 5.8 %
Neutrophil #: 5.7 10*3/uL (ref 1.4–6.5)
Neutrophil %: 56.6 %
Platelet: 241 10*3/uL (ref 150–440)
RBC: 3.46 10*6/uL — ABNORMAL LOW (ref 3.80–5.20)
RDW: 15.1 % — ABNORMAL HIGH (ref 11.5–14.5)

## 2013-07-02 LAB — LIPID PANEL
Cholesterol: 106 mg/dL (ref 0–200)
Triglycerides: <2 mg/dL — ABNORMAL LOW (ref 0–200)

## 2013-07-02 LAB — BASIC METABOLIC PANEL
Anion Gap: 5 — ABNORMAL LOW (ref 7–16)
Co2: 34 mmol/L — ABNORMAL HIGH (ref 21–32)
Creatinine: 2.2 mg/dL — ABNORMAL HIGH (ref 0.60–1.30)
EGFR (African American): 21 — ABNORMAL LOW
Glucose: 160 mg/dL — ABNORMAL HIGH (ref 65–99)
Potassium: 3.3 mmol/L — ABNORMAL LOW (ref 3.5–5.1)
Sodium: 140 mmol/L (ref 136–145)

## 2013-07-02 LAB — APTT
Activated PTT: 159.5 secs — ABNORMAL HIGH (ref 23.6–35.9)
Activated PTT: 94.7 secs — ABNORMAL HIGH (ref 23.6–35.9)

## 2013-07-04 ENCOUNTER — Ambulatory Visit: Payer: MEDICARE | Admitting: Internal Medicine

## 2013-07-07 ENCOUNTER — Non-Acute Institutional Stay: Payer: MEDICARE | Admitting: Internal Medicine

## 2013-07-07 ENCOUNTER — Encounter: Payer: Self-pay | Admitting: Internal Medicine

## 2013-07-07 VITALS — BP 120/72 | HR 70 | Resp 18

## 2013-07-07 DIAGNOSIS — I959 Hypotension, unspecified: Secondary | ICD-10-CM

## 2013-07-07 NOTE — Patient Instructions (Signed)
Hypotension As your heart beats, it forces blood through your arteries. This force is your blood pressure. If your blood pressure is too low for you to go about your normal activities or support the organs of your body, you have hypotension, or low blood pressure. When your blood pressure becomes too low, you may not get enough blood to your brain, and you may feel weak, lightheaded, or develop a more rapid heart rate. In a more severe case, you may faint: this is a sudden, brief loss of consciousness where you pass out and recover completely. CAUSES  Loss of blood or fluids from the body. This occurs during rapid blood loss. It can also come from dehydration when the body is not taking in enough fluids or is losing fluids faster than they can be replaced. Examples of this would be severe vomiting and diarrhea.  Not taking in enough fluids and salts. This is common in the elderly where thirst mechanisms are not working as well. This means you do not feel thirsty and you do not take in enough water.  Use of blood pressure pills and other medications that may lower the blood pressure below normal.  Over medication (always take your medications as directed).  Irregular heart beat or heart failure when the heart is no longer working well enough to support blood pressure. Hospitalization is sometimes required for low blood pressure if fluid or blood replacement is needed, if time is needed for medications to wear off, or if further evaluation is needed. Less common causes of low blood pressure might include peripheral or autonomic neuropathy (nerve problems), Parkinson's disease, or other illnesses. Treatment might include a change in diet, change in medications (including medicines aimed at raising your blood pressure), and use of support stockings. HOME CARE INSTRUCTIONS   Maintain good fluid intake and use a little more salt on your food (if you are not on a restricted diet or having problems with your  heart such as heart failure). This is especially important for the elderly when you may not feel thirsty in the winter.  Take your medications as directed.  Get up slowly from reclining or sitting positions. This gives your blood pressure a chance to adjust. As we grow older our ability to regulate our blood pressure may not be as good as when we were younger.  Wear support stockings if prescribed.  Use walkers, canes, etc., if advised.  Talk with your physician or nurse about a Home Safety Evaluation (usually done by visiting nurses). SEEK IMMEDIATE MEDICAL CARE IF:   You have a fainting episode. Do not drive yourself. Call 911 if no other help is available.  You have chest pain, nausea (feeling sick to your stomach) or vomiting.  You have a loss of feeling in some part of your body, or lose movement in your arms or legs.  You have difficulty with speech.  You become sweaty and/or feel light headed. Make sure you are re-checked as instructed. MAKE SURE YOU:   Understand these instructions.  Will watch your condition.  Will get help right away if you are not doing well or get worse. Document Released: 07/13/2005 Document Revised: 10/05/2011 Document Reviewed: 01/13/2013 [employer] Patient Information 2014 [employer].  

## 2013-07-07 NOTE — Progress Notes (Signed)
Subjective:    Patient ID: Sharon [name], female    DOB: [birth year], 79 y.o.   MRN: [id number]  HPI  Pt was seen at [facility] [place] room 201 f//or hospital follow up She was taken to [facility] ER on 07/03/13 for hypotension. She was admitted for monitoring. She denies dizziness, chest pain, chest tightness or shortness of breath. She has had some fatigue but this has been a chronic issue for her. She reports that while in the hospital, her blood pressures were stable. She reports that they did not change her blood pressure medication that she is aware of. She also reports that her blood pressure has been stable since returning back to [place]. She is on Lopresser daily. She does have a history of CAD s/p NSTEMI.  Review of Systems      Past Medical History  Diagnosis Date  . Diabetes mellitus, type 2   . Diabetic nephropathy   . GERD (gastroesophageal reflux disease)   . Essential hypertension, benign   . Retroperitoneal bleed     a. in setting of plavix Rx in past.  . Osteoarthritis     with chronic back pain  . Osteopenia   . Urge incontinence     mild  . History of CVA (cerebrovascular accident) 1  . Vitamin D deficiency   . Hyperlipidemia   . Vertigo   . Restless leg syndrome   . Anemia   . Hiatal hernia   . Venous stasis   . Systolic CHF 10/14  . Coronary atherosclerosis of native coronary artery     a. 2012 Cath: LM 40, LAD 70p, nonobs RCA dzs-->med managed;  b. 06/2011 Myoview: mod-large zone mod-sev rev defect of ant/antlat wall, EF 70%;  c. 01/2012 Echo: EF >55%, mod dil LA, mild TR.  . Non-STEMI (non-ST elevated myocardial infarction)   . Stroke     Current Outpatient Prescriptions  Medication Sig Dispense Refill  . alum & mag hydroxide-simeth (MAALOX/MYLANTA) 200-200-20 MG/5ML suspension Take 15 mLs by mouth every 6 (six) hours as needed for indigestion.      Marland Kitchen amoxicillin-clavulanate (AUGMENTIN) 500-125 MG per tablet Take 1 tablet (500 mg total) by mouth 2 (two) times  daily.  14 tablet  0  . aspirin EC 81 MG tablet Take 81 mg by mouth every evening.      Marland Kitchen atorvastatin (LIPITOR) 40 MG tablet Take 1 tablet (40 mg total) by mouth daily at 6 PM.      . famotidine (PEPCID) 20 MG tablet Take 1 tablet (20 mg total) by mouth 2 (two) times daily as needed for heartburn.      . furosemide (LASIX) 40 MG tablet Take 1.5 tablets (60 mg total) by mouth 2 (two) times daily.  30 tablet    . glucose blood test strip Use bid. Pt has One Touch Ultra  100 each  5  . guaiFENesin-dextromethorphan (ROBITUSSIN DM) 100-10 MG/5ML syrup Take 5 mLs by mouth 3 (three) times daily as needed for cough.  118 mL  0  . insulin glargine (LANTUS) 100 UNIT/ML injection Inject 0.25 mLs (25 Units total) into the skin at bedtime.  10 mL    . isosorbide mononitrate (IMDUR) 30 MG 24 hr tablet Take 1 tablet (30 mg total) by mouth daily.      . metoprolol tartrate (LOPRESSOR) 25 MG tablet Take 12.5 mg by mouth 2 (two) times daily.      . nitroGLYCERIN (NITROSTAT) 0.4 MG SL tablet Place 1 tablet (  0.4 mg total) under the tongue every 5 (five) minutes as needed for chest pain.  25 tablet  6  . omeprazole (PRILOSEC) 40 MG capsule Take 40 mg by mouth every morning.      . ondansetron (ZOFRAN) 4 MG tablet Take 4 mg by mouth every 8 (eight) hours as needed for nausea (take 30 min before tramadol).      Bertram Gala Glycol-Propyl Glycol (SYSTANE OP) Place 1 drop into the left eye 4 (four) times daily as needed (for 2 days only after monthly eye injection).      . pramipexole (MIRAPEX) 0.25 MG tablet Take 0.5-1 mg by mouth at bedtime as needed (may take additional tablet at bedtime as needed for restless legs).      . ranolazine (RANEXA) 500 MG 12 hr tablet Take 1 tablet (500 mg total) by mouth 2 (two) times daily. Take and additional dose twice daily as needed for chest pain  90 tablet  3  . traMADol (ULTRAM) 50 MG tablet Take 1 tablet (50 mg total) by mouth every 8 (eight) hours as needed.  30 tablet  5  .  Vitamin D, Ergocalciferol, (DRISDOL) 50000 UNITS CAPS capsule Take 50,000 Units by mouth every 30 (thirty) days. 18th of each month       No current facility-administered medications for this visit.    Allergies  Allergen Reactions  . Codeine     Nausea & vomiting  . Ezetimibe   . Hydrocodone Bitartrate   . Latex   . Levaquin [Levofloxacin] Nausea And Vomiting  . Naproxen     Family History  Problem Relation Age of Onset  . Diabetes Mother     History   Social History  . Marital Status: Widowed    Spouse Name: N/A    Number of Children: N/A  . Years of Education: N/A   Occupational History  . Not on file.   Social History Main Topics  . Smoking status: Never Smoker   . Smokeless tobacco: Never Used  . Alcohol Use: No  . Drug Use: No  . Sexual Activity: Not on file   Other Topics Concern  . Not on file   Social History Narrative   Has living will   Son [name] is health care POA.   Has DNR           Constitutional: Denies fever, malaise, fatigue, headache or abrupt weight changes.  Respiratory: Denies difficulty breathing, shortness of breath, cough or sputum production.   Cardiovascular: Denies chest pain, chest tightness, palpitations or swelling in the hands or feet.  Neurological: Denies dizziness, difficulty with memory, difficulty with speech or problems with balance and coordination.   No other specific complaints in a complete review of systems (except as listed in HPI above).  Objective:   Physical Exam  BP 120/72  Pulse 70  Resp 18 Wt Readings from Last 3 Encounters:  06/23/13 186 lb (84.369 kg)  06/08/13 186 lb (84.369 kg)  06/01/13 186 lb 12 oz (84.709 kg)    General: Appears her stated age, well developed, well nourished in NAD. Cardiovascular: Normal rate and rhythm. S1,S2 noted.  No murmur, rubs or gallops noted. No JVD or BLE edema. No carotid bruits noted. Pulmonary/Chest: Normal effort and positive vesicular breath sounds. No  respiratory distress. No wheezes, rales or ronchi noted.  Neurological: Alert and oriented. Cranial nerves II-XII intact. Coordination normal. +DTRs bilaterally.   BMET    Component Value Date/Time   NA 140  05/08/2013 0440   K 4.2 05/08/2013 0440   CL 104 05/08/2013 0440   CO2 28 05/08/2013 0440   GLUCOSE 62* 05/08/2013 0440   BUN 83* 05/08/2013 0440   CREATININE 2.60* 05/08/2013 0440   CREATININE 2.24* 10/26/2011 0925   CALCIUM 8.7 05/08/2013 0440   CALCIUM 9.1 09/21/2012 1331   GFRNONAA 15* 05/08/2013 0440   GFRAA 17* 05/08/2013 0440    Lipid Panel  No results found for this basename: chol, trig, hdl, cholhdl, vldl, ldlcalc    CBC    Component Value Date/Time   WBC 12.4* 05/04/2013 0915   RBC 3.31* 05/04/2013 0915   HGB 10.0* 05/04/2013 0915   HCT 29.4* 05/04/2013 0915   PLT 218 05/04/2013 0915   MCV 88.8 05/04/2013 0915   MCH 30.2 05/04/2013 0915   MCHC 34.0 05/04/2013 0915   RDW 14.6 05/04/2013 0915   LYMPHSABS 1.4 05/02/2013 0538   MONOABS 0.8 05/02/2013 0538   EOSABS 0.1 05/02/2013 0538   BASOSABS 0.0 05/02/2013 0538    Hgb A1C Lab Results  Component Value Date   HGBA1C 8.7* 03/23/2013         Assessment & Plan:   Hospital followup for hypertensive episode:  I was unable to find any notes from her hospital admission She is normotensive and asymptomatic since discharge We will continue to monitor her blood pressure daily and continue her current regimen at this time If has another hypotensive episode, can consider decreasing lasix versus reducing metoprolol (as long as patient appears to be euvolemic)  Will followup with patient on as needed basis or every 6 months

## 2013-07-10 ENCOUNTER — Encounter: Payer: Self-pay | Admitting: Podiatry

## 2013-07-10 ENCOUNTER — Ambulatory Visit (INDEPENDENT_AMBULATORY_CARE_PROVIDER_SITE_OTHER): Payer: MEDICARE | Admitting: Podiatry

## 2013-07-10 VITALS — BP 122/60 | HR 72 | Resp 18

## 2013-07-10 DIAGNOSIS — M79609 Pain in unspecified limb: Secondary | ICD-10-CM

## 2013-07-10 DIAGNOSIS — B351 Tinea unguium: Secondary | ICD-10-CM

## 2013-07-10 NOTE — Progress Notes (Signed)
   Subjective:    Patient ID: Sharon [name], female    DOB: October 28, 1915, 79 y.o.   MRN: [id number]  HPI Comments: Ive been in and out of the hospital , my toenails are real long and need to be cut badly     Review of Systems     Objective:   Physical Exam: Pulses are palpable bilateral. Nails are thick yellow dystrophic onychomycotic and painful on palpation.        Assessment & Plan:  Assessment: Pain in limb secondary to onychomycosis 1 through 5 bilateral.  Plan: Debridement nails 1 through 5 bilateral covered service secondary to pain.

## 2013-07-20 ENCOUNTER — Inpatient Hospital Stay: Payer: Self-pay | Admitting: Internal Medicine

## 2013-07-20 DIAGNOSIS — I2510 Atherosclerotic heart disease of native coronary artery without angina pectoris: Secondary | ICD-10-CM

## 2013-07-20 DIAGNOSIS — I5023 Acute on chronic systolic (congestive) heart failure: Secondary | ICD-10-CM

## 2013-07-20 DIAGNOSIS — N189 Chronic kidney disease, unspecified: Secondary | ICD-10-CM

## 2013-07-20 LAB — CBC
HCT: 32.9 % — ABNORMAL LOW (ref 35.0–47.0)
HGB: 11.1 g/dL — ABNORMAL LOW (ref 12.0–16.0)
MCH: 30.4 pg (ref 26.0–34.0)
MCHC: 33.8 g/dL (ref 32.0–36.0)
MCV: 90 fL (ref 80–100)
Platelet: 186 10*3/uL (ref 150–440)
RBC: 3.65 10*6/uL — ABNORMAL LOW (ref 3.80–5.20)
RDW: 15.7 % — ABNORMAL HIGH (ref 11.5–14.5)
WBC: 8.4 10*3/uL (ref 3.6–11.0)

## 2013-07-20 LAB — BASIC METABOLIC PANEL
BUN: 28 mg/dL — ABNORMAL HIGH (ref 7–18)
Calcium, Total: 8.3 mg/dL — ABNORMAL LOW (ref 8.5–10.1)
Chloride: 98 mmol/L (ref 98–107)
Co2: 34 mmol/L — ABNORMAL HIGH (ref 21–32)
Creatinine: 2.41 mg/dL — ABNORMAL HIGH (ref 0.60–1.30)
EGFR (African American): 19 — ABNORMAL LOW
EGFR (Non-African Amer.): 17 — ABNORMAL LOW
Potassium: 3.7 mmol/L (ref 3.5–5.1)
Sodium: 137 mmol/L (ref 136–145)

## 2013-07-20 LAB — HEPATIC FUNCTION PANEL A (ARMC)
Albumin: 3.2 g/dL — ABNORMAL LOW (ref 3.4–5.0)
SGPT (ALT): 17 U/L (ref 12–78)
Total Protein: 6.3 g/dL — ABNORMAL LOW (ref 6.4–8.2)

## 2013-07-20 LAB — APTT
Activated PTT: 25 secs (ref 23.6–35.9)
Activated PTT: >160 s
Activated PTT: 121 s — ABNORMAL HIGH

## 2013-07-20 LAB — CK-MB
CK-MB: 5.5 ng/mL — ABNORMAL HIGH (ref 0.5–3.6)
CK-MB: 6.1 ng/mL — ABNORMAL HIGH (ref 0.5–3.6)

## 2013-07-20 LAB — TROPONIN I: Troponin-I: 2.4 ng/mL — ABNORMAL HIGH

## 2013-07-21 LAB — BASIC METABOLIC PANEL
BUN: 30 mg/dL — ABNORMAL HIGH (ref 7–18)
Calcium, Total: 8.9 mg/dL (ref 8.5–10.1)
Creatinine: 1.93 mg/dL — ABNORMAL HIGH (ref 0.60–1.30)
EGFR (African American): 25 — ABNORMAL LOW
EGFR (Non-African Amer.): 22 — ABNORMAL LOW
Potassium: 4.3 mmol/L (ref 3.5–5.1)
Sodium: 137 mmol/L (ref 136–145)

## 2013-07-21 LAB — LIPID PANEL
Cholesterol: 128 mg/dL (ref 0–200)
HDL Cholesterol: 64 mg/dL — ABNORMAL HIGH (ref 40–60)
Triglycerides: 91 mg/dL (ref 0–200)

## 2013-07-21 LAB — APTT: Activated PTT: 108.7 secs — ABNORMAL HIGH (ref 23.6–35.9)

## 2013-07-21 LAB — TSH: Thyroid Stimulating Horm: 3.21 u[IU]/mL

## 2013-07-24 ENCOUNTER — Telehealth: Payer: Self-pay

## 2013-07-24 NOTE — Telephone Encounter (Signed)
Patient contacted regarding discharge from [facility] on 07/21/13.  Patient understands to follow up with provider Dr. Mariah Milling on 08/07/13 at 11:15 at [facility]. Patient understands discharge instructions? yes Patient understands medications and regiment? yes Patient understands to bring all medications to this visit? yes  Pt's husband would like to discuss a possible protocol for the nurses at [facility] "not to rush her to the hospital every time she has one of these little heart attacks w/o being accused of negligence." Reports that her symptoms usually resolve after taking nitro and he is concerned that she is taken to the ED each time she c/p chest pain. He states that he would like to discuss this w/ Dr. Mariah Milling at next ov on 1/12.

## 2013-07-25 ENCOUNTER — Encounter: Payer: Self-pay | Admitting: Internal Medicine

## 2013-07-27 ENCOUNTER — Ambulatory Visit: Payer: Self-pay | Admitting: Internal Medicine

## 2013-07-28 ENCOUNTER — Telehealth: Payer: Self-pay | Admitting: *Deleted

## 2013-07-28 NOTE — Telephone Encounter (Signed)
Refill Request  Unistik 3 comfort 1.8 mL  #100  Fingerstick blood sugar test twice daily

## 2013-07-28 NOTE — Telephone Encounter (Signed)
Rx phoned to pharmacy.  

## 2013-08-07 ENCOUNTER — Ambulatory Visit (INDEPENDENT_AMBULATORY_CARE_PROVIDER_SITE_OTHER): Payer: MEDICARE | Admitting: Cardiovascular Disease

## 2013-08-07 ENCOUNTER — Encounter: Payer: Self-pay | Admitting: Cardiovascular Disease

## 2013-08-07 VITALS — BP 136/52 | HR 75 | Ht 60.0 in | Wt 191.8 lb

## 2013-08-07 DIAGNOSIS — E785 Hyperlipidemia, unspecified: Secondary | ICD-10-CM

## 2013-08-07 DIAGNOSIS — E1365 Other specified diabetes mellitus with hyperglycemia: Secondary | ICD-10-CM

## 2013-08-07 DIAGNOSIS — R079 Chest pain, unspecified: Secondary | ICD-10-CM

## 2013-08-07 DIAGNOSIS — I10 Essential (primary) hypertension: Secondary | ICD-10-CM

## 2013-08-07 DIAGNOSIS — I200 Unstable angina: Secondary | ICD-10-CM

## 2013-08-07 DIAGNOSIS — N184 Chronic kidney disease, stage 4 (severe): Secondary | ICD-10-CM

## 2013-08-07 DIAGNOSIS — I509 Heart failure, unspecified: Secondary | ICD-10-CM

## 2013-08-07 DIAGNOSIS — I5022 Chronic systolic (congestive) heart failure: Secondary | ICD-10-CM

## 2013-08-07 DIAGNOSIS — E1329 Other specified diabetes mellitus with other diabetic kidney complication: Secondary | ICD-10-CM

## 2013-08-07 DIAGNOSIS — E1322 Other specified diabetes mellitus with diabetic chronic kidney disease: Secondary | ICD-10-CM

## 2013-08-07 DIAGNOSIS — IMO0002 Reserved for concepts with insufficient information to code with codable children: Secondary | ICD-10-CM

## 2013-08-07 NOTE — Progress Notes (Signed)
Patient ID: Sharon [name], female    DOB: 01/01/1916, 80 y.o.   MRN: [id number]  HPI Comments: Ms. Sharon [name] is a very pleasant 80 yo woman with history of coronary artery disease, previously turned down for bypass given renal dysfunction who presented to [facility] February 02 2012 with dizziness, lightheaded, bradycardia. She was found to be in junctional rhythm with heart rates in the 20s to 30s, low blood pressure with systolics in the 80s, potassium 5.5, worsening renal dysfunction consistent with dehydration.  Notes indicated a history of congestive heart failure, cardiac catheterization in [place] several years earlier found to have three-vessel disease.  She is currently living in assisted living at [facility]. She has periodic episodes of angina that improves with nitroglycerin. Several trips to the emergency room, sometimes with hospitalization overnight for her chest pain symptoms often found with elevated cardiac enzymes.   Hospital admission in October 2014 to [place] for respiratory failure, ejection fraction 35-40%. Medical management recommended .  She had a non-STEMI with peak troponin greater than 20.  Her son was part of these discussions.   Evaluation in the hospital July 01 2013 for chest pain. Chest pain triggered by coughing and deep breaths. Medical management recommended  Evaluation in the hospital 07/20/2013 for chest pain which responded to nitroglycerin x1. She threw up after nitroglycerin and was sent to the emergency room. Troponin up to 2.4. Medical management recommended again as she has advanced chronic kidney disease, also due to her age  Echocardiogram dated 07/02/2013 showing ejection fraction 35-40%, moderate LVH  Recent in the hospital on 07/01/2013 for chest pain. BNP 9700, Total cholesterol 120, LDL 38, HDL 47  2D Echocardiogram 10.7.2014 - Left ventricle: The cavity size was normal.Systolic function was moderately reduced. The estimated  ejection fraction   was in the range of 35% to 40%. Diffuse hypokinesis.   Doppler parameters are consistent with abnormal left   ventricular relaxation (grade 1 diastolic dysfunction). - Left atrium: The atrium was mildly dilated. - Pulmonary arteries: PA peak pressure: 39mm Hg (S).  Notes from cardiologist in 2012 detailing cardiac cath in February 2012 with 40% left main disease, 70% proximal LAD disease, nonobstructed RCA disease Stent was not performed. Followup stress test in 2012  EKG shows normal sinus rhythm with rate 75  beats per minute,LBBB    Outpatient Encounter Prescriptions as of 08/07/2013  Medication Sig  . alum & mag hydroxide-simeth (MAALOX/MYLANTA) 200-200-20 MG/5ML suspension Take 15 mLs by mouth every 6 (six) hours as needed for indigestion.  Marland Kitchen. aspirin EC 81 MG tablet Take 81 mg by mouth every evening.  Marland Kitchen. atorvastatin (LIPITOR) 40 MG tablet Take 1 tablet (40 mg total) by mouth daily at 6 PM.  . azithromycin (ZITHROMAX) 500 MG tablet Take by mouth daily. 1 pill daily x 3 days  . famotidine (PEPCID) 20 MG tablet Take 1 tablet (20 mg total) by mouth 2 (two) times daily as needed for heartburn.  . furosemide (LASIX) 40 MG tablet Take 1.5 tablets (60 mg total) by mouth 2 (two) times daily.  Marland Kitchen. glucose blood test strip Use bid. Pt has One Touch Ultra  . guaiFENesin-dextromethorphan (ROBITUSSIN DM) 100-10 MG/5ML syrup Take 5 mLs by mouth 3 (three) times daily as needed for cough.  . insulin glargine (LANTUS) 100 UNIT/ML injection Inject 0.25 mLs (25 Units total) into the skin at bedtime.  . isosorbide mononitrate (IMDUR) 30 MG 24 hr tablet Take 1 tablet (30 mg total) by mouth  daily.  . metoprolol tartrate (LOPRESSOR) 25 MG tablet Take 12.5 mg by mouth 2 (two) times daily.  . nitroGLYCERIN (NITROSTAT) 0.4 MG SL tablet Place 1 tablet (0.4 mg total) under the tongue every 5 (five) minutes as needed for chest pain.  Marland Kitchen omeprazole (PRILOSEC) 40 MG capsule Take 40 mg by mouth every  morning.  . ondansetron (ZOFRAN) 4 MG tablet Take 4 mg by mouth every 8 (eight) hours as needed for nausea (take 30 min before tramadol).  Bertram Gala Glycol-Propyl Glycol (SYSTANE OP) Place 1 drop into the left eye 4 (four) times daily as needed (for 2 days only after monthly eye injection).  . pramipexole (MIRAPEX) 0.25 MG tablet Take 0.5-1 mg by mouth at bedtime as needed (may take additional tablet at bedtime as needed for restless legs).  . ranolazine (RANEXA) 500 MG 12 hr tablet Take 1 tablet (500 mg total) by mouth 2 (two) times daily. Take and additional dose twice daily as needed for chest pain  . traMADol (ULTRAM) 50 MG tablet Take 1 tablet (50 mg total) by mouth every 8 (eight) hours as needed.  . Vitamin D, Ergocalciferol, (DRISDOL) 50000 UNITS CAPS capsule Take 50,000 Units by mouth every 30 (thirty) days. 18th of each month  . [DISCONTINUED] amoxicillin-clavulanate (AUGMENTIN) 500-125 MG per tablet      Review of Systems  Constitutional: Negative.   HENT: Negative.   Eyes: Negative.   Respiratory: Negative.   Cardiovascular: Negative.   Gastrointestinal: Negative.   Endocrine: Negative.   Musculoskeletal: Negative.   Skin: Negative.   Allergic/Immunologic: Negative.   Neurological: Negative.        Foot tingling  Hematological: Negative.   Psychiatric/Behavioral: Negative.   All other systems reviewed and are negative.   BP 136/52  Pulse 75  Ht 5' (1.524 m)  Wt 191 lb 12 oz (86.977 kg)  BMI 37.45 kg/m2  Physical Exam  Nursing note and vitals reviewed. Constitutional: She is oriented to person, place, and time. She appears well-developed and well-nourished.  Elderly woman that walks relatively quickly with a 3 wheeled walker  HENT:  Head: Normocephalic.  Nose: Nose normal.  Mouth/Throat: Oropharynx is clear and moist.  Eyes: Conjunctivae are normal. Pupils are equal, round, and reactive to light.  Neck: Normal range of motion. Neck supple. No JVD present.   Cardiovascular: Normal rate, regular rhythm, S1 normal, S2 normal, normal heart sounds and intact distal pulses.  Exam reveals no gallop and no friction rub.   No murmur heard. Pulmonary/Chest: Effort normal and breath sounds normal. No respiratory distress. She has no wheezes. She has no rales. She exhibits no tenderness.  Abdominal: Soft. Bowel sounds are normal. She exhibits no distension. There is no tenderness.  Musculoskeletal: Normal range of motion. She exhibits no edema and no tenderness.  Lymphadenopathy:    She has no cervical adenopathy.  Neurological: She is alert and oriented to person, place, and time. Coordination normal.  Skin: Skin is warm and dry. No rash noted. No erythema.  Psychiatric: She has a normal mood and affect. Her behavior is normal. Judgment and thought content normal.    Assessment and Plan

## 2013-08-07 NOTE — Patient Instructions (Signed)
You are doing well. Please take NTG SL up to three pills q5 minutes for chest pain/angina If no significant improvement chest pain, ok to send to hospital   Please call us if you have new issues that need to be addressed before your next appt.  Your physician wants you to follow-up in: 3 months.  You will receive a reminder letter in the mail two months in advance. If you don't receive a letter, please call our office to schedule the follow-up appointment.

## 2013-08-07 NOTE — Assessment & Plan Note (Signed)
Previously evaluated by interventional cardiology on multiple admissions to the hospital. Medical management recommended. We will recommend the use of nitroglycerin up to 3 every 5 minutes for symptoms of angina. If symptoms persist after nitroglycerin x3, the son has agreed that evaluation in the emergency room might be needed

## 2013-08-07 NOTE — Assessment & Plan Note (Signed)
Cholesterol is at goal on the current lipid regimen. No changes to the medications were made.  

## 2013-08-07 NOTE — Assessment & Plan Note (Signed)
We have encouraged continued careful diet management in an effort to lose weight.  

## 2013-08-07 NOTE — Assessment & Plan Note (Signed)
Blood pressure is well controlled on today's visit. No changes made to the medications. 

## 2013-08-07 NOTE — Assessment & Plan Note (Signed)
Appears to be relatively euvolemic on today's visit

## 2013-08-12 LAB — URINALYSIS, COMPLETE
Bilirubin,UR: NEGATIVE
Glucose,UR: NEGATIVE mg/dL (ref 0–75)
KETONE: NEGATIVE
NITRITE: POSITIVE
Ph: 5 (ref 4.5–8.0)
RBC,UR: 3 /HPF (ref 0–5)
SPECIFIC GRAVITY: 1.012 (ref 1.003–1.030)
SQUAMOUS EPITHELIAL: NONE SEEN
WBC UR: 26 /HPF (ref 0–5)

## 2013-08-12 LAB — CBC WITH DIFFERENTIAL/PLATELET
Basophil #: 0 10*3/uL (ref 0.0–0.1)
Basophil %: 0.3 %
EOS PCT: 0.9 %
Eosinophil #: 0.1 10*3/uL (ref 0.0–0.7)
HCT: 35.2 % (ref 35.0–47.0)
HGB: 11.6 g/dL — ABNORMAL LOW (ref 12.0–16.0)
Lymphocyte #: 0.4 10*3/uL — ABNORMAL LOW (ref 1.0–3.6)
Lymphocyte %: 4.4 %
MCH: 29.3 pg (ref 26.0–34.0)
MCHC: 33.1 g/dL (ref 32.0–36.0)
MCV: 89 fL (ref 80–100)
Monocyte #: 0.2 x10 3/mm (ref 0.2–0.9)
Monocyte %: 1.9 %
NEUTROS PCT: 92.5 %
Neutrophil #: 9.3 10*3/uL — ABNORMAL HIGH (ref 1.4–6.5)
Platelet: 188 10*3/uL (ref 150–440)
RBC: 3.97 10*6/uL (ref 3.80–5.20)
RDW: 15.3 % — AB (ref 11.5–14.5)
WBC: 10.1 10*3/uL (ref 3.6–11.0)

## 2013-08-12 LAB — CK-MB: CK-MB: 3.9 ng/mL — ABNORMAL HIGH (ref 0.5–3.6)

## 2013-08-12 LAB — COMPREHENSIVE METABOLIC PANEL
ALBUMIN: 3 g/dL — AB (ref 3.4–5.0)
ALK PHOS: 120 U/L — AB
ALT: 17 U/L (ref 12–78)
AST: 20 U/L (ref 15–37)
Anion Gap: 4 — ABNORMAL LOW (ref 7–16)
BILIRUBIN TOTAL: 0.4 mg/dL (ref 0.2–1.0)
BUN: 33 mg/dL — AB (ref 7–18)
Calcium, Total: 8 mg/dL — ABNORMAL LOW (ref 8.5–10.1)
Chloride: 103 mmol/L (ref 98–107)
Co2: 31 mmol/L (ref 21–32)
Creatinine: 2.2 mg/dL — ABNORMAL HIGH (ref 0.60–1.30)
GFR CALC AF AMER: 21 — AB
GFR CALC NON AF AMER: 18 — AB
Glucose: 227 mg/dL — ABNORMAL HIGH (ref 65–99)
Osmolality: 290 (ref 275–301)
Potassium: 3.7 mmol/L (ref 3.5–5.1)
Sodium: 138 mmol/L (ref 136–145)
Total Protein: 6.3 g/dL — ABNORMAL LOW (ref 6.4–8.2)

## 2013-08-12 LAB — TROPONIN I: TROPONIN-I: 0.47 ng/mL — AB

## 2013-08-13 ENCOUNTER — Inpatient Hospital Stay: Payer: Self-pay | Admitting: Internal Medicine

## 2013-08-13 DIAGNOSIS — I214 Non-ST elevation (NSTEMI) myocardial infarction: Secondary | ICD-10-CM

## 2013-08-13 LAB — CBC WITH DIFFERENTIAL/PLATELET
BASOS PCT: 0.4 %
Basophil #: 0 10*3/uL (ref 0.0–0.1)
EOS PCT: 0.9 %
Eosinophil #: 0.1 10*3/uL (ref 0.0–0.7)
HCT: 31.7 % — ABNORMAL LOW (ref 35.0–47.0)
HGB: 10.6 g/dL — AB (ref 12.0–16.0)
LYMPHS ABS: 0.6 10*3/uL — AB (ref 1.0–3.6)
LYMPHS PCT: 6.6 %
MCH: 29.8 pg (ref 26.0–34.0)
MCHC: 33.6 g/dL (ref 32.0–36.0)
MCV: 89 fL (ref 80–100)
Monocyte #: 0.3 x10 3/mm (ref 0.2–0.9)
Monocyte %: 3.1 %
Neutrophil #: 7.5 10*3/uL — ABNORMAL HIGH (ref 1.4–6.5)
Neutrophil %: 89 %
Platelet: 174 10*3/uL (ref 150–440)
RBC: 3.57 10*6/uL — ABNORMAL LOW (ref 3.80–5.20)
RDW: 15 % — ABNORMAL HIGH (ref 11.5–14.5)
WBC: 8.4 10*3/uL (ref 3.6–11.0)

## 2013-08-13 LAB — CK-MB
CK-MB: 13 ng/mL — ABNORMAL HIGH (ref 0.5–3.6)
CK-MB: 19.7 ng/mL — AB (ref 0.5–3.6)

## 2013-08-13 LAB — BASIC METABOLIC PANEL
Anion Gap: 4 — ABNORMAL LOW (ref 7–16)
BUN: 32 mg/dL — AB (ref 7–18)
CALCIUM: 7.7 mg/dL — AB (ref 8.5–10.1)
Chloride: 105 mmol/L (ref 98–107)
Co2: 30 mmol/L (ref 21–32)
Creatinine: 2.05 mg/dL — ABNORMAL HIGH (ref 0.60–1.30)
EGFR (African American): 23 — ABNORMAL LOW
EGFR (Non-African Amer.): 20 — ABNORMAL LOW
Glucose: 195 mg/dL — ABNORMAL HIGH (ref 65–99)
Osmolality: 290 (ref 275–301)
POTASSIUM: 3.6 mmol/L (ref 3.5–5.1)
SODIUM: 139 mmol/L (ref 136–145)

## 2013-08-13 LAB — TROPONIN I
Troponin-I: 2.2 ng/mL — ABNORMAL HIGH
Troponin-I: 5.2 ng/mL — ABNORMAL HIGH

## 2013-08-13 LAB — MAGNESIUM: MAGNESIUM: 1.4 mg/dL — AB

## 2013-08-13 LAB — APTT
Activated PTT: 26.7 secs (ref 23.6–35.9)
Activated PTT: >160 secs (ref 23.6–35.9)
Activated PTT: >160 secs (ref 23.6–35.9)

## 2013-08-14 ENCOUNTER — Telehealth: Payer: Self-pay | Admitting: Internal Medicine

## 2013-08-14 LAB — BASIC METABOLIC PANEL
Anion Gap: 4 — ABNORMAL LOW (ref 7–16)
BUN: 28 mg/dL — AB (ref 7–18)
Calcium, Total: 8.4 mg/dL — ABNORMAL LOW (ref 8.5–10.1)
Chloride: 105 mmol/L (ref 98–107)
Co2: 29 mmol/L (ref 21–32)
Creatinine: 2.08 mg/dL — ABNORMAL HIGH (ref 0.60–1.30)
EGFR (African American): 23 — ABNORMAL LOW
GFR CALC NON AF AMER: 20 — AB
Glucose: 154 mg/dL — ABNORMAL HIGH (ref 65–99)
Osmolality: 284 (ref 275–301)
Potassium: 4.1 mmol/L (ref 3.5–5.1)
Sodium: 138 mmol/L (ref 136–145)

## 2013-08-14 LAB — LIPID PANEL
CHOLESTEROL: 99 mg/dL (ref 0–200)
HDL Cholesterol: 53 mg/dL (ref 40–60)
Ldl Cholesterol, Calc: 27 mg/dL (ref 0–100)
Triglycerides: 95 mg/dL (ref 0–200)
VLDL Cholesterol, Calc: 19 mg/dL (ref 5–40)

## 2013-08-14 LAB — CBC WITH DIFFERENTIAL/PLATELET
Basophil #: 0.1 10*3/uL (ref 0.0–0.1)
Basophil %: 0.8 %
Eosinophil #: 0.4 10*3/uL (ref 0.0–0.7)
Eosinophil %: 5.2 %
HCT: 30.1 % — AB (ref 35.0–47.0)
HGB: 9.9 g/dL — ABNORMAL LOW (ref 12.0–16.0)
LYMPHS ABS: 1.5 10*3/uL (ref 1.0–3.6)
Lymphocyte %: 22.1 %
MCH: 29.8 pg (ref 26.0–34.0)
MCHC: 32.9 g/dL (ref 32.0–36.0)
MCV: 91 fL (ref 80–100)
MONO ABS: 0.5 x10 3/mm (ref 0.2–0.9)
Monocyte %: 6.7 %
Neutrophil #: 4.5 10*3/uL (ref 1.4–6.5)
Neutrophil %: 65.2 %
Platelet: 143 10*3/uL — ABNORMAL LOW (ref 150–440)
RBC: 3.32 10*6/uL — ABNORMAL LOW (ref 3.80–5.20)
RDW: 15.5 % — AB (ref 11.5–14.5)
WBC: 6.9 10*3/uL (ref 3.6–11.0)

## 2013-08-14 LAB — TROPONIN I: Troponin-I: 9.9 ng/mL — ABNORMAL HIGH

## 2013-08-14 LAB — APTT
Activated PTT: 130.8 secs — ABNORMAL HIGH (ref 23.6–35.9)
Activated PTT: 86.3 secs — ABNORMAL HIGH (ref 23.6–35.9)

## 2013-08-14 NOTE — Telephone Encounter (Signed)
PC from [facility] Had more nausea, dry heaves Went to hospital--- having another MI  Son is there Accepts her time is limited Really wants to get her back to [place]  She will need hospice to get back to [place] May need skilled anyway

## 2013-08-15 LAB — URINE CULTURE

## 2013-08-15 LAB — BASIC METABOLIC PANEL
Anion Gap: 3 — ABNORMAL LOW (ref 7–16)
BUN: 32 mg/dL — AB (ref 7–18)
CALCIUM: 8.6 mg/dL (ref 8.5–10.1)
CO2: 29 mmol/L (ref 21–32)
CREATININE: 2.45 mg/dL — AB (ref 0.60–1.30)
Chloride: 102 mmol/L (ref 98–107)
EGFR (African American): 19 — ABNORMAL LOW
GFR CALC NON AF AMER: 16 — AB
Glucose: 146 mg/dL — ABNORMAL HIGH (ref 65–99)
Osmolality: 278 (ref 275–301)
Potassium: 4 mmol/L (ref 3.5–5.1)
Sodium: 134 mmol/L — ABNORMAL LOW (ref 136–145)

## 2013-08-15 LAB — MAGNESIUM: Magnesium: 1.7 mg/dL — ABNORMAL LOW

## 2013-08-15 LAB — APTT: ACTIVATED PTT: 71 s — AB (ref 23.6–35.9)

## 2013-08-16 LAB — BASIC METABOLIC PANEL
ANION GAP: 2 — AB (ref 7–16)
BUN: 42 mg/dL — AB (ref 7–18)
CALCIUM: 8.4 mg/dL — AB (ref 8.5–10.1)
CREATININE: 2.49 mg/dL — AB (ref 0.60–1.30)
Chloride: 105 mmol/L (ref 98–107)
Co2: 31 mmol/L (ref 21–32)
EGFR (African American): 18 — ABNORMAL LOW
GFR CALC NON AF AMER: 16 — AB
GLUCOSE: 93 mg/dL (ref 65–99)
Osmolality: 286 (ref 275–301)
Potassium: 4.5 mmol/L (ref 3.5–5.1)
SODIUM: 138 mmol/L (ref 136–145)

## 2013-08-16 LAB — APTT: Activated PTT: 32.2 secs (ref 23.6–35.9)

## 2013-08-17 ENCOUNTER — Telehealth: Payer: Self-pay | Admitting: *Deleted

## 2013-08-17 NOTE — Telephone Encounter (Signed)
Yes Glad to hear that her son has accepted that I will plan to see her next Thursday when I go to [place]

## 2013-08-17 NOTE — Telephone Encounter (Signed)
Left message on VM with results, advised to call if any problems

## 2013-08-17 NOTE — Telephone Encounter (Signed)
Raynelle Fanning from [facility] is calling stating pt is being released today back to assisted living and wanted to know if you where ok with hospice services? Please advise

## 2013-08-21 DIAGNOSIS — N184 Chronic kidney disease, stage 4 (severe): Secondary | ICD-10-CM

## 2013-08-21 DIAGNOSIS — I5022 Chronic systolic (congestive) heart failure: Secondary | ICD-10-CM

## 2013-08-21 DIAGNOSIS — I219 Acute myocardial infarction, unspecified: Secondary | ICD-10-CM

## 2013-08-21 DIAGNOSIS — E1129 Type 2 diabetes mellitus with other diabetic kidney complication: Secondary | ICD-10-CM

## 2013-08-27 ENCOUNTER — Ambulatory Visit: Payer: Self-pay | Admitting: Internal Medicine

## 2013-09-01 ENCOUNTER — Telehealth: Payer: Self-pay | Admitting: *Deleted

## 2013-09-01 NOTE — Telephone Encounter (Signed)
Spoke w/ Angelica Chessman, RN at [facility] who reports that she had a meeting w/ pt's family today and she will now be under the care of Hospice due to her declining health status.   Advised her of pt's most recent EF so that she can document this in her records.

## 2013-09-01 NOTE — Telephone Encounter (Signed)
Please call Nurse at [facility].  Family is wanting to know her cardiac funtion/ cardiac output.

## 2013-09-04 ENCOUNTER — Ambulatory Visit: Payer: MEDICARE | Admitting: Cardiovascular Disease

## 2013-09-04 DIAGNOSIS — L259 Unspecified contact dermatitis, unspecified cause: Secondary | ICD-10-CM

## 2013-09-04 DIAGNOSIS — I872 Venous insufficiency (chronic) (peripheral): Secondary | ICD-10-CM

## 2013-09-05 DIAGNOSIS — I219 Acute myocardial infarction, unspecified: Secondary | ICD-10-CM

## 2013-09-06 DIAGNOSIS — I509 Heart failure, unspecified: Secondary | ICD-10-CM

## 2013-09-06 DIAGNOSIS — I219 Acute myocardial infarction, unspecified: Secondary | ICD-10-CM

## 2013-09-11 ENCOUNTER — Ambulatory Visit: Payer: MEDICARE | Admitting: Podiatry

## 2013-09-24 DEATH — deceased

## 2013-11-08 IMAGING — CR DG CHEST 1V PORT
1 series · 1 of 1 positions shown · non-contrast
Comparison: none

REASON FOR EXAM: dyspnea
COMMENTS:

PROCEDURE:     DXR - DXR PORTABLE CHEST SINGLE VIEW  - May 01, 2013  [DATE]
RESULT:     Comparison: None

[ap]
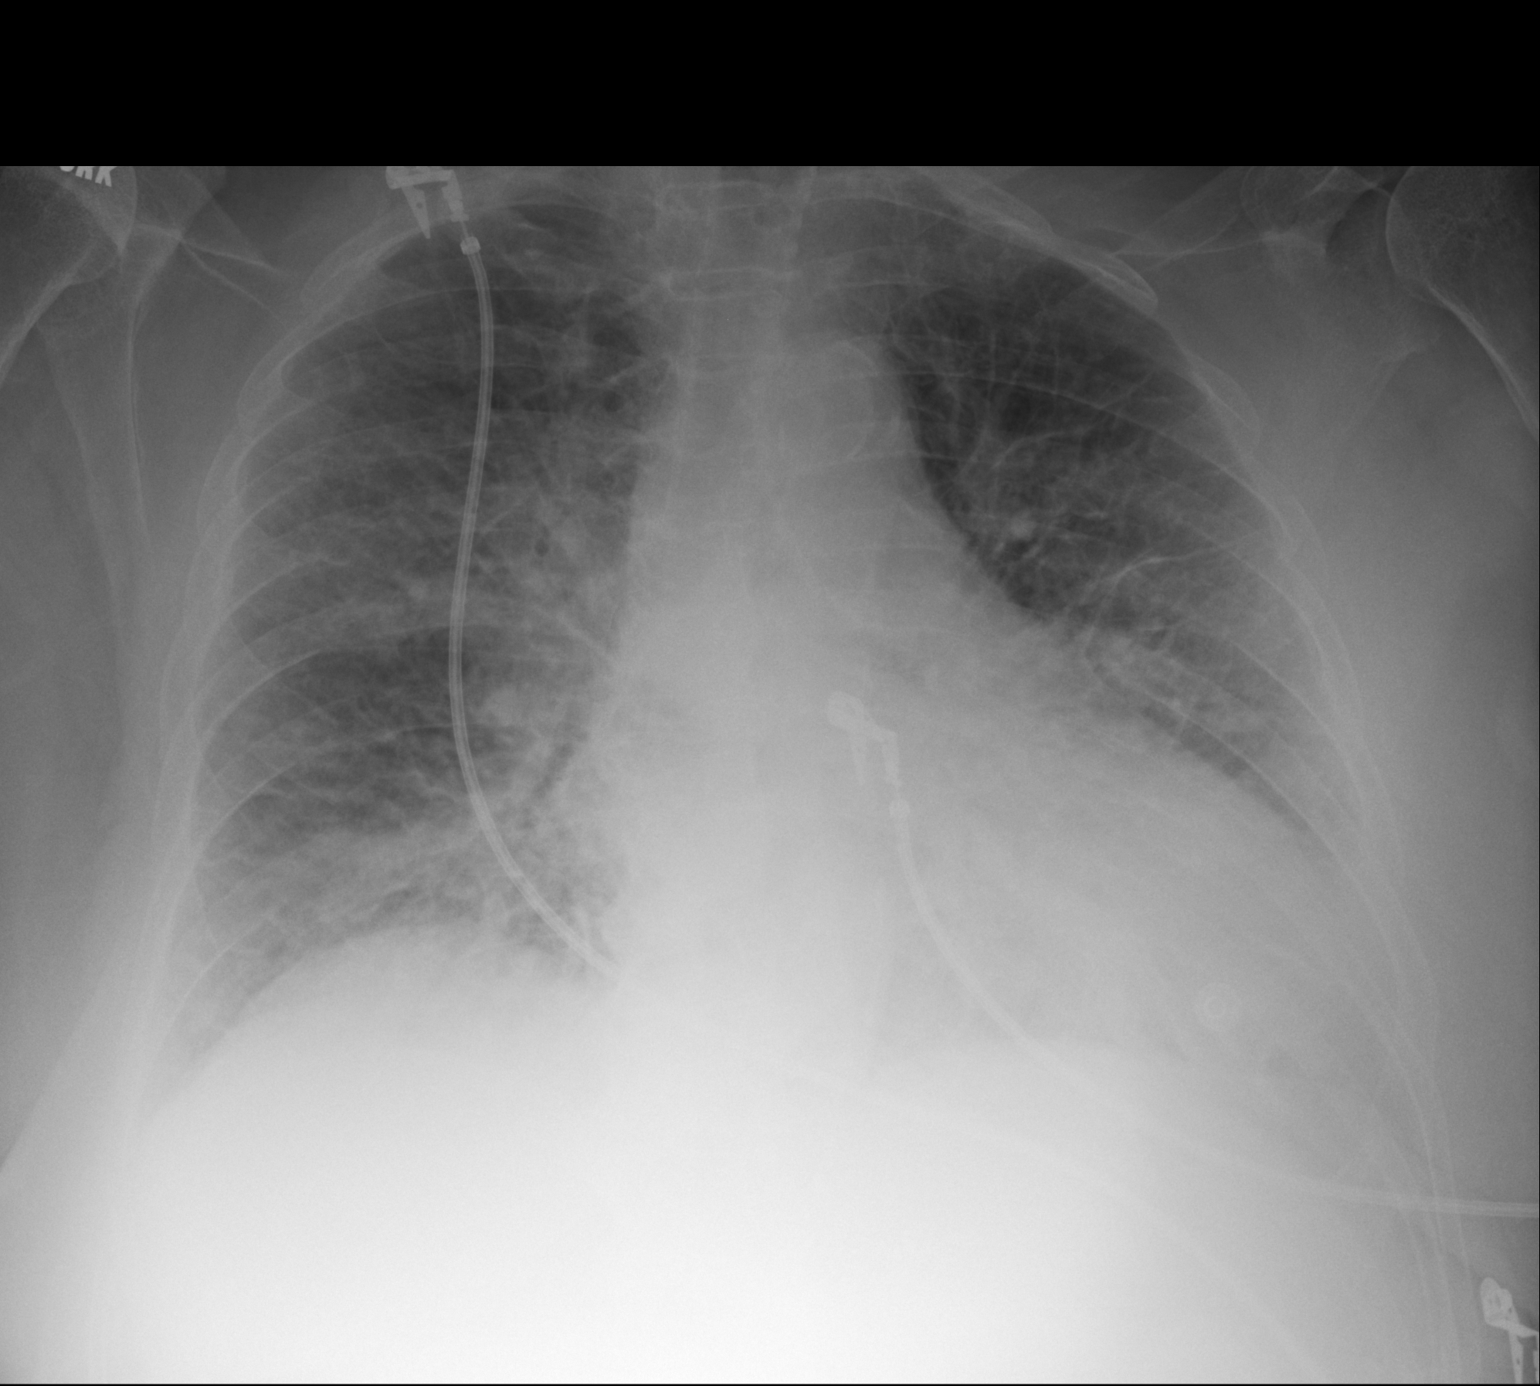

[1 of 1 positions shown; findings below may reference images not displayed]

FINDINGS: Single portable AP chest radiograph is provided. There is bilateral diffuse
interstitial thickening likely representing interstitial edema versus
interstitial pneumonitis secondary to an infectious or inflammatory
etiology. There is no focal consolidation, pleural effusion, or
pneumothorax. Normal cardiomediastinal silhouette. The osseous structures
are unremarkable.
IMPRESSION: There is bilateral diffuse interstitial thickening likely representing
interstitial edema versus interstitial pneumonitis secondary to an
infectious or inflammatory etiology.

[REDACTED]

## 2013-11-09 IMAGING — CR DG CHEST 1V PORT
1 series · 1 of 1 positions shown · non-contrast
Comparison: None.

CLINICAL DATA: Short of breath

EXAM:
PORTABLE CHEST - 1 VIEW

[AP]
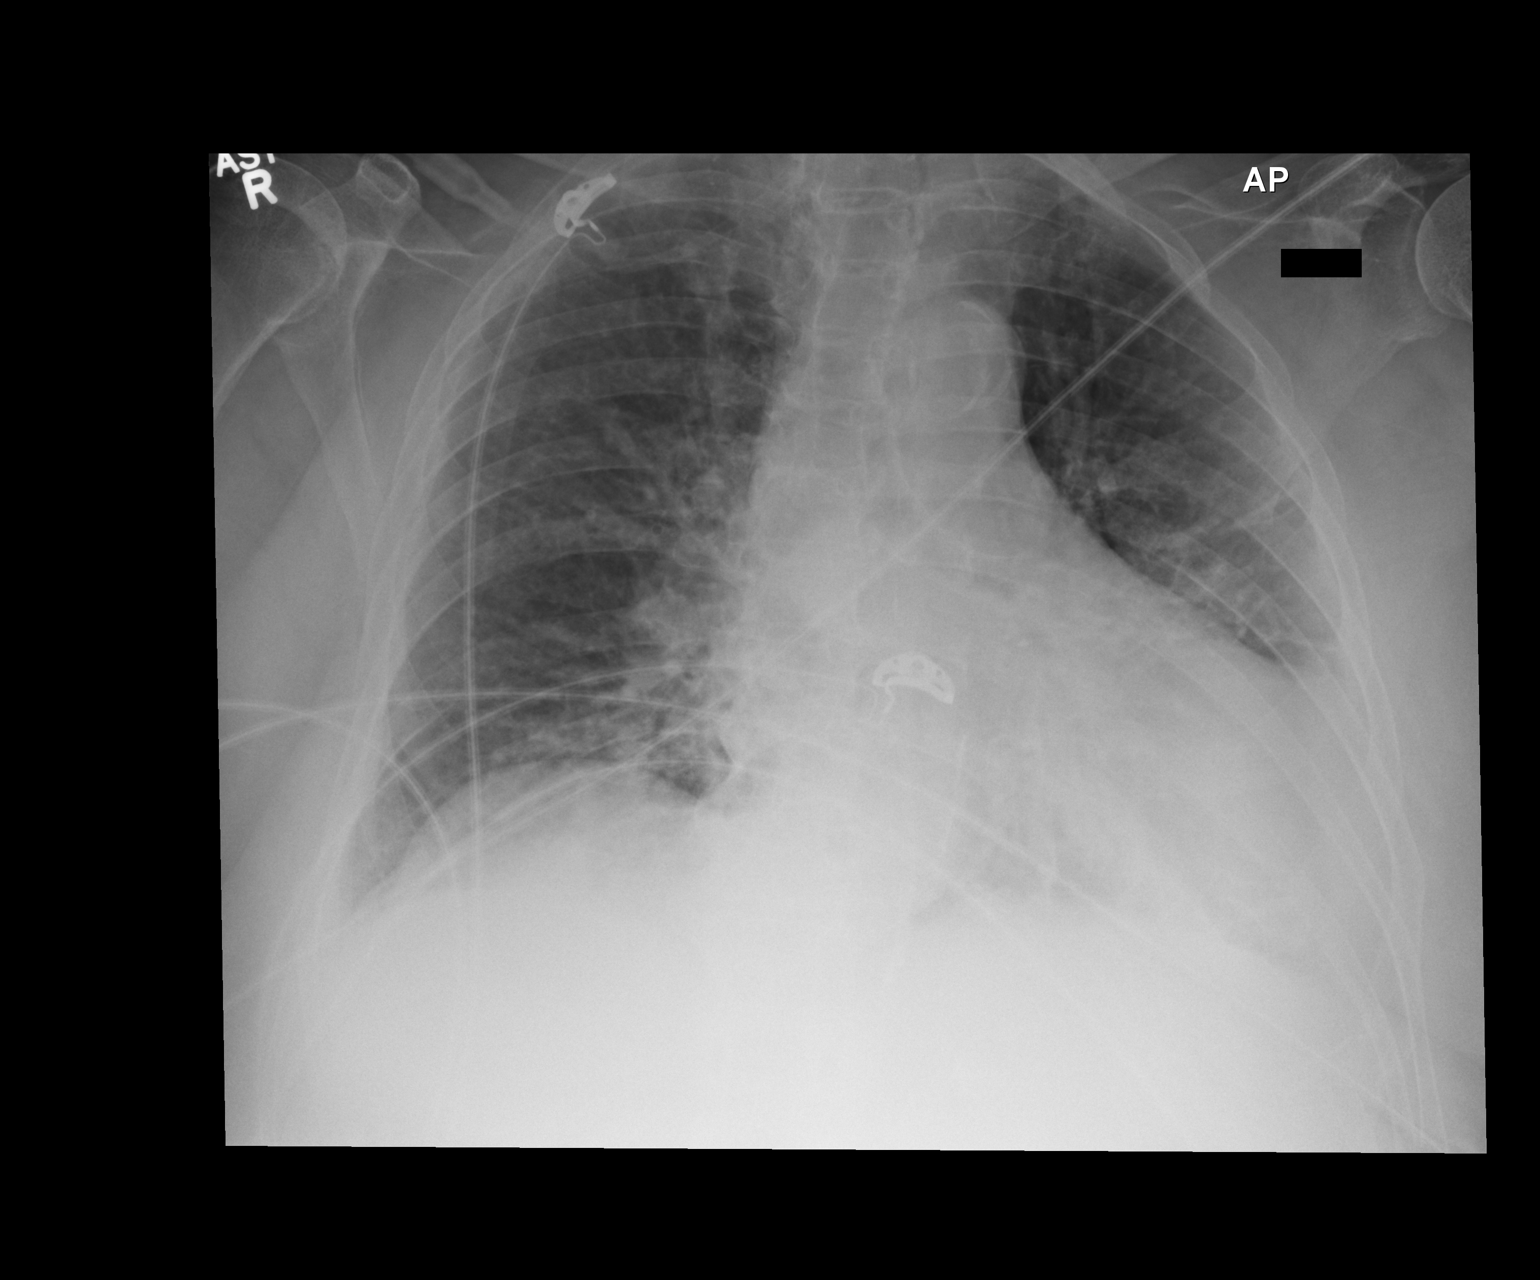

[1 of 1 positions shown; findings below may reference images not displayed]

FINDINGS: Cardiac silhouette is normal in size. Mediastinum is normal in
contour.

Medial lung base opacity is noted bilaterally, likely atelectasis.
There is central interstitial thickening. Mild reticular opacities
are noted in the left mid lung peripherally. No overt pulmonary
edema or convincing infiltrate. No pleural effusion or pneumothorax
is seen.
IMPRESSION: Central interstitial thickening with medial lung base atelectasis.
No overt edema or convincing infiltrate.

## 2014-01-27 IMAGING — CR DG CHEST 2V
1 series · 2 of 2 positions shown · non-contrast
Comparison: None.

CLINICAL DATA: Chest pain.

EXAM:
CHEST  2 VIEW

[Series 1: x chest ap · 0.14mm/px · 2 of 2 slices shown]
[im 1/2]
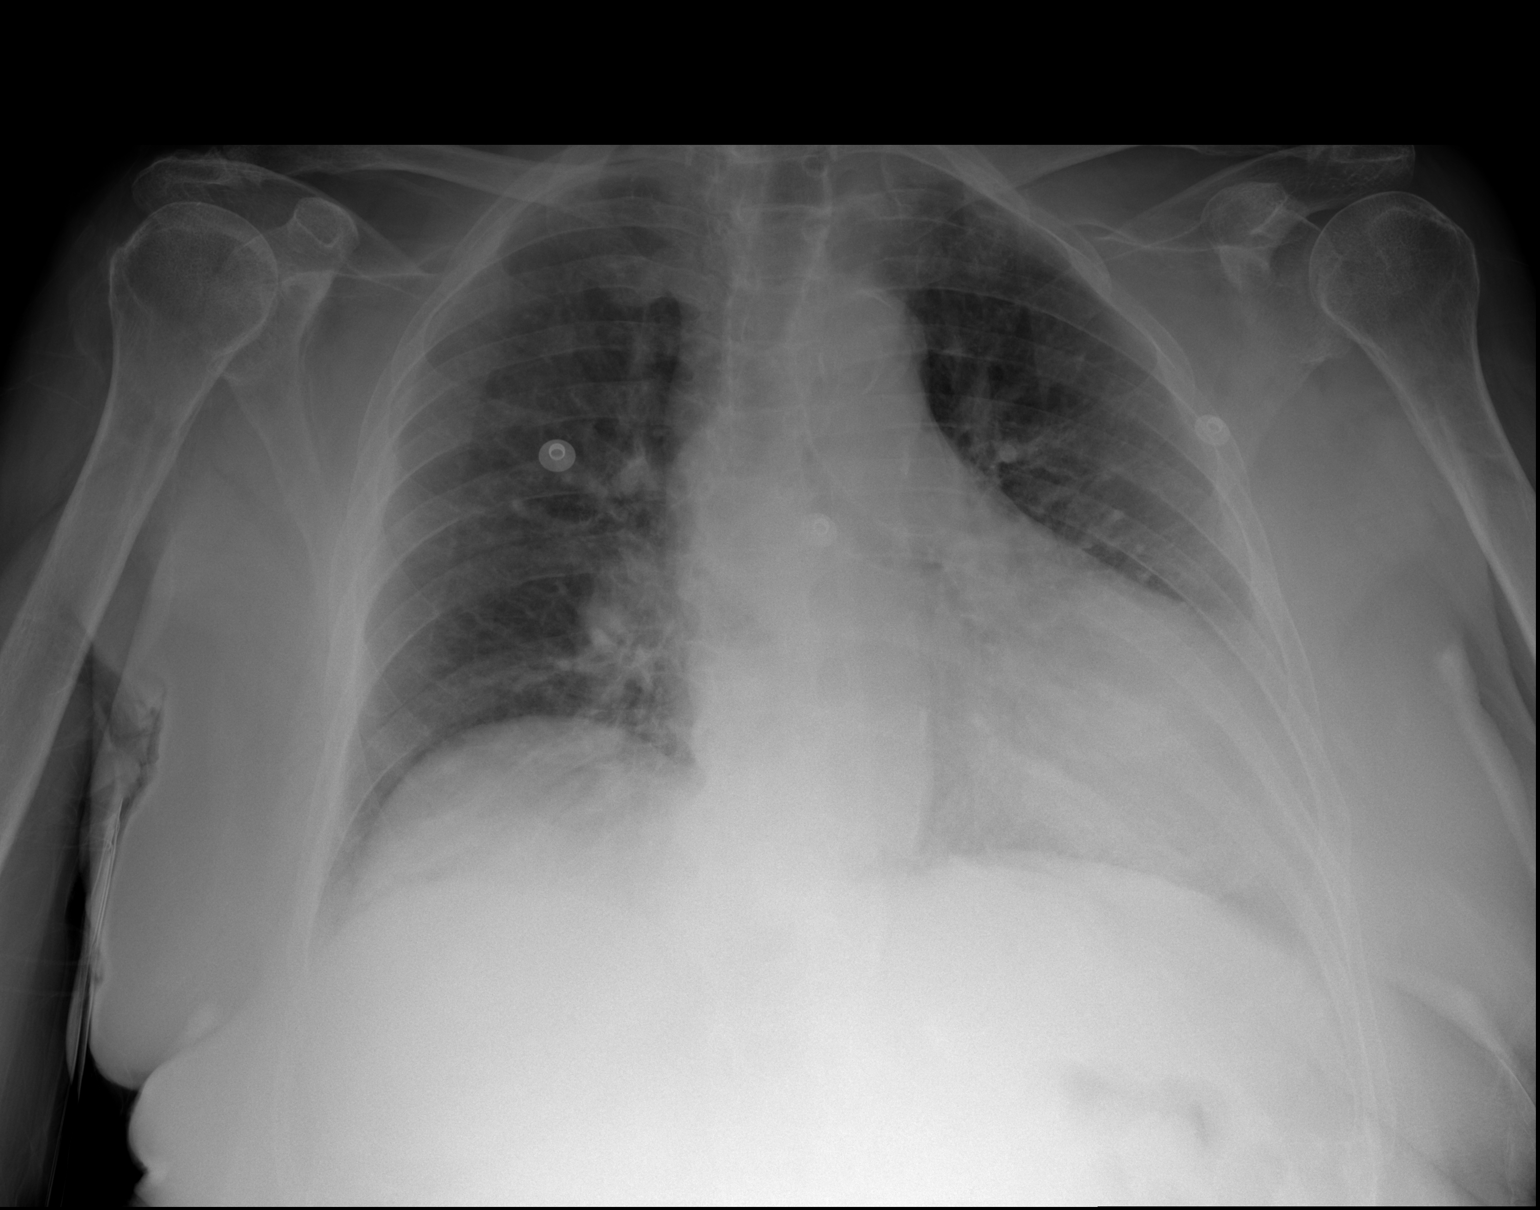
[im 2/2]
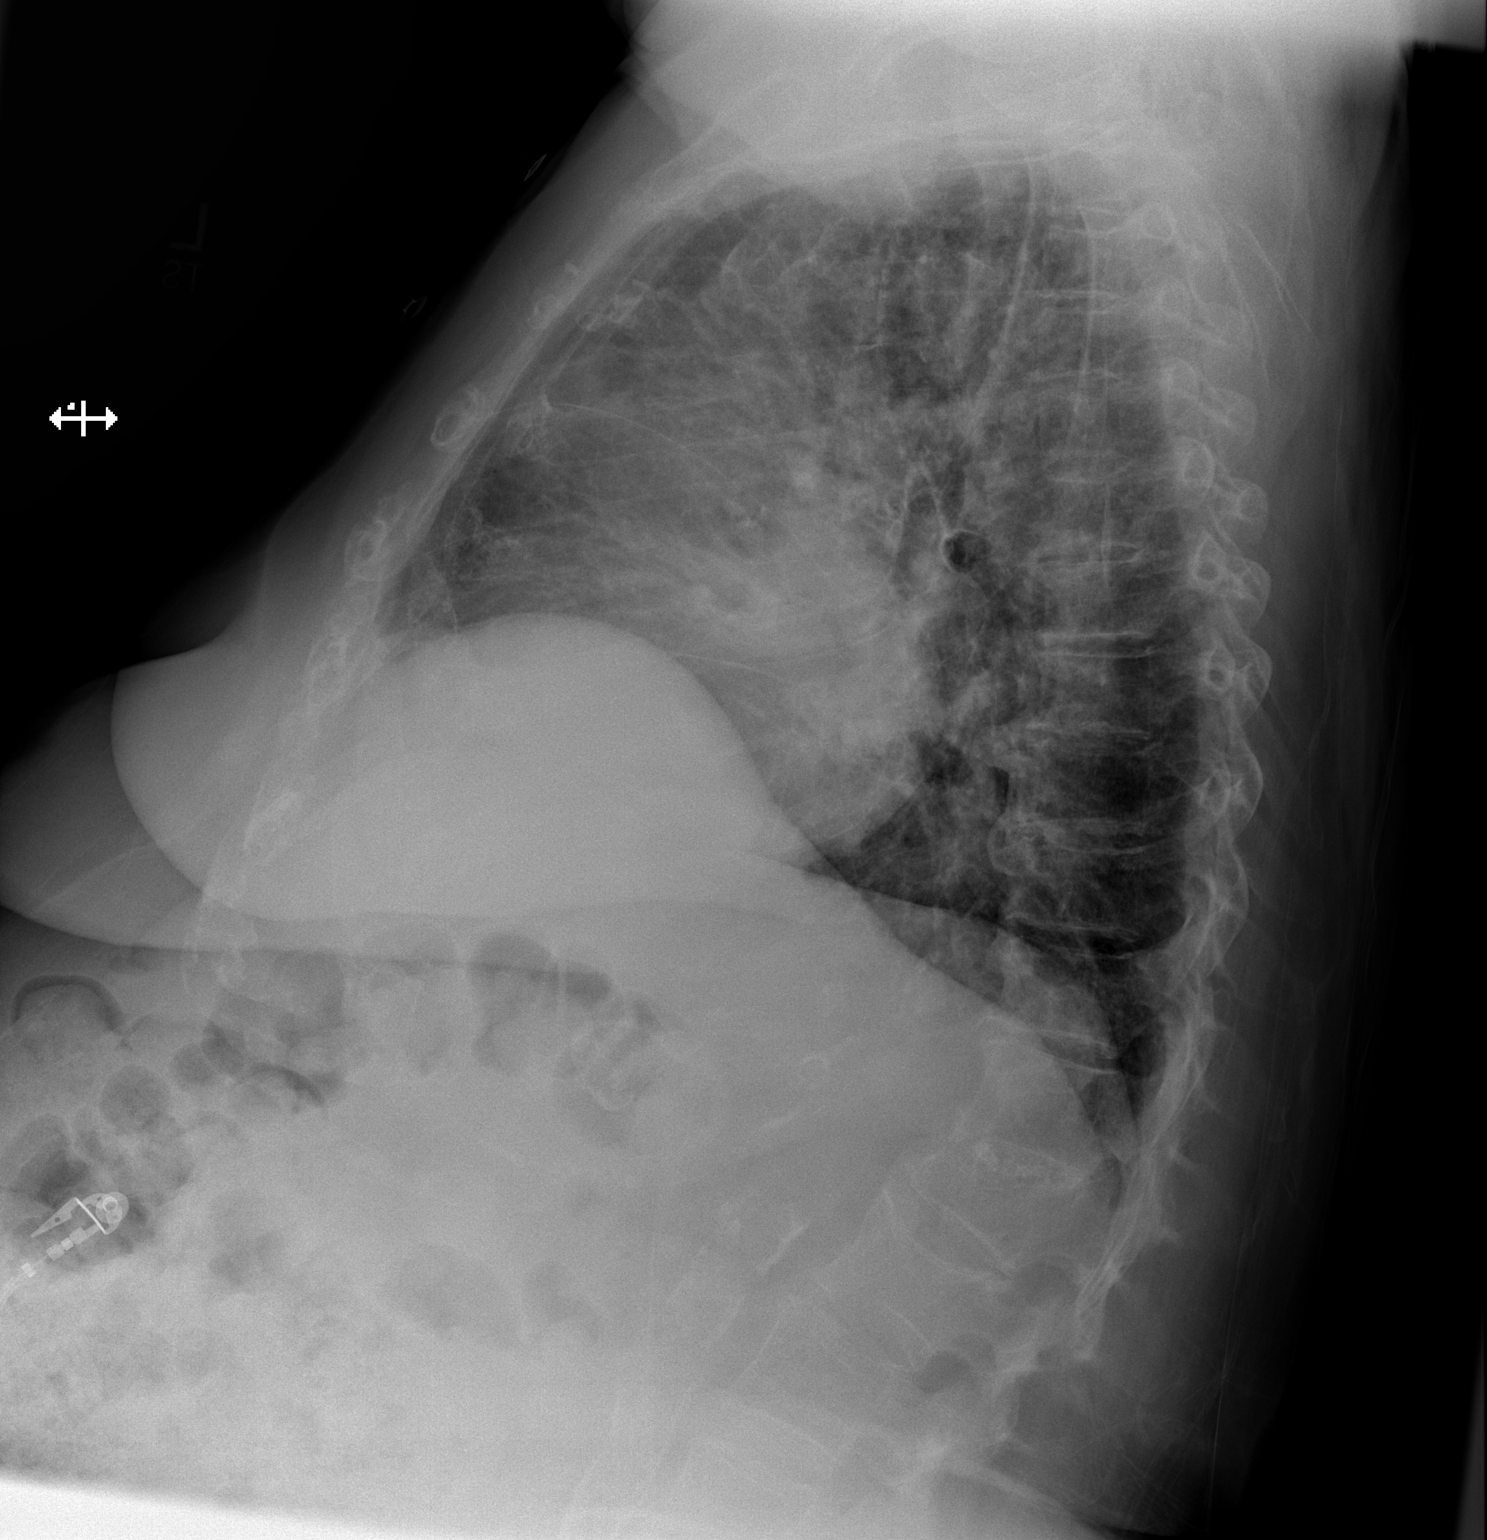

[2 of 2 positions shown; findings below may reference images not displayed]

FINDINGS: Cardiomegaly. Pulmonary vascular congestion is present. There is no
focal airspace disease. Retrocardiac atelectasis. Eventration of the
right hemidiaphragm. Lower thoracic compression fracture is chronic.
IMPRESSION: Cardiomegaly and pulmonary vascular congestion suggesting mild CHF.

## 2014-02-23 IMAGING — CR DG CHEST 1V PORT
1 series · 1 of 1 positions shown · non-contrast
Comparison: 07/20/2013.

CLINICAL DATA: Shortness of breath.

EXAM:
PORTABLE CHEST - 1 VIEW

[ap]
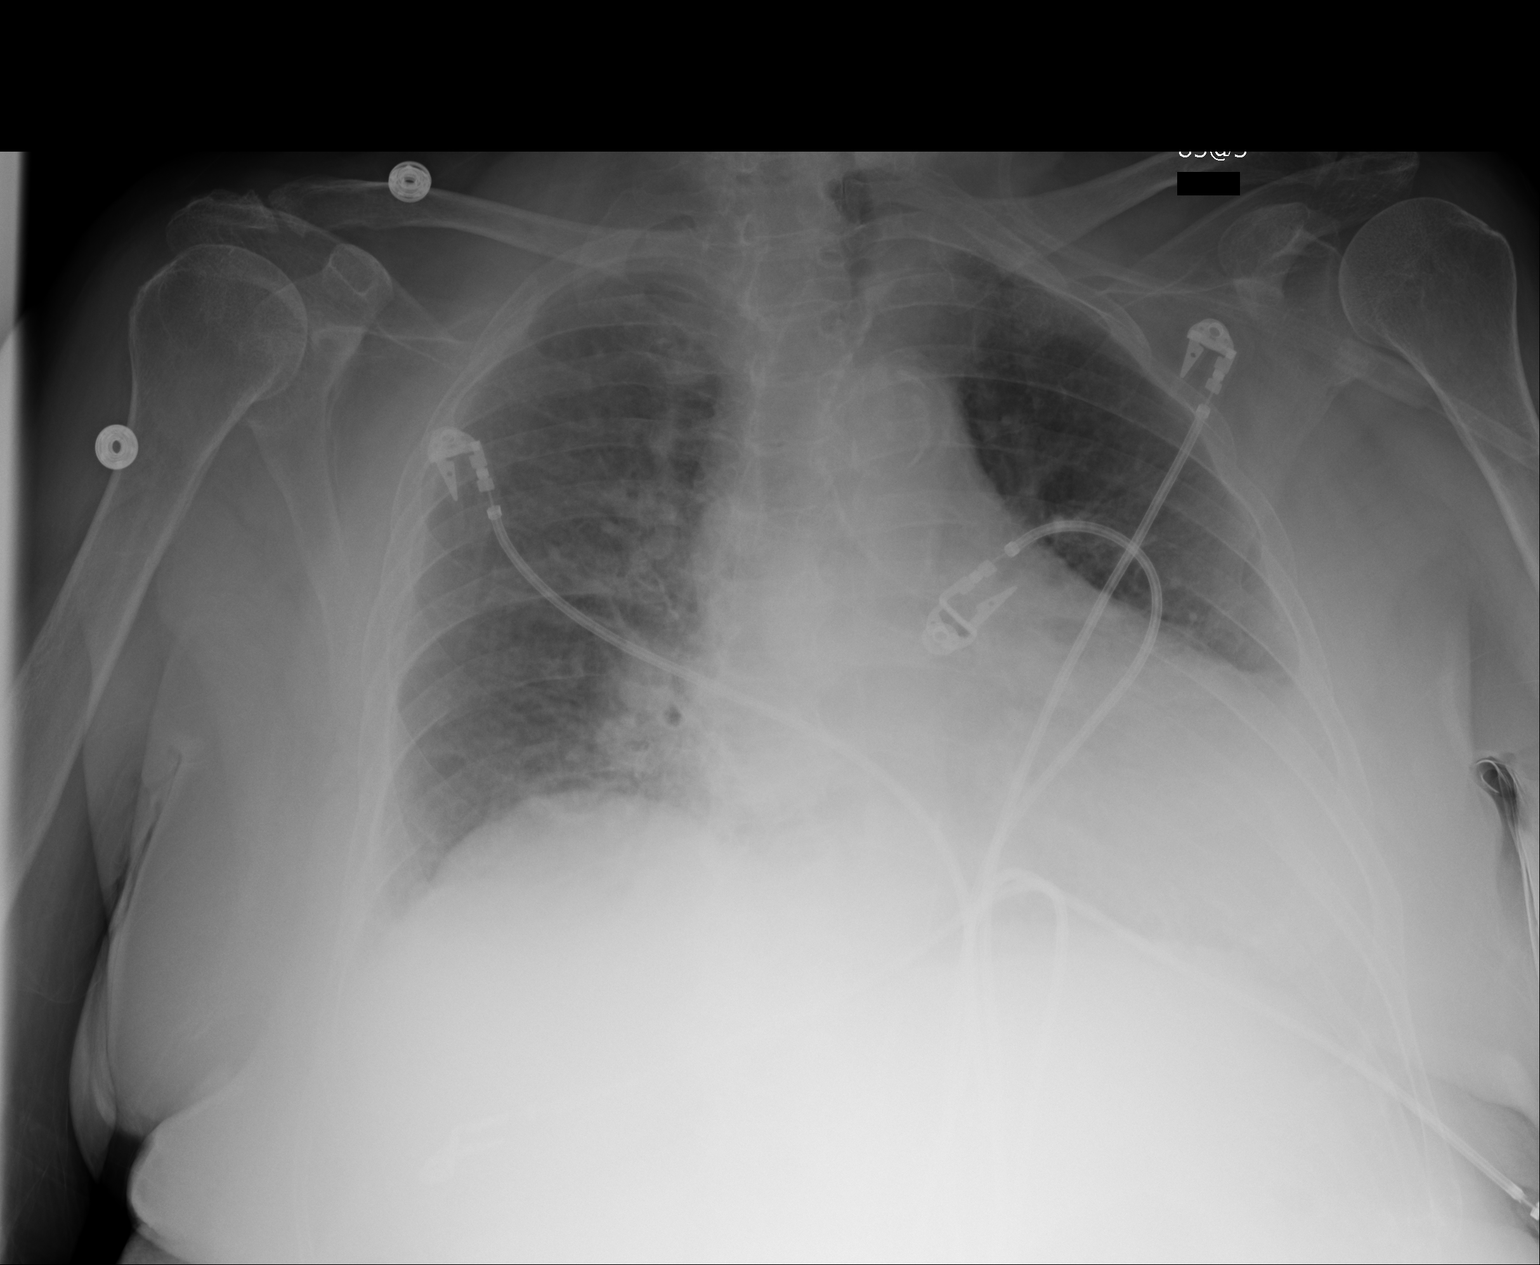

[1 of 1 positions shown; findings below may reference images not displayed]

FINDINGS: Cardiomegaly with pulmonary vascular prominence and interstitial
prominence noted. These findings have increased from prior exam and
are consistent with congestive heart failure and interstitial edema.
No pneumothorax. No acute osseous abnormality.
IMPRESSION: Congestive heart failure with pulmonary interstitial edema.
Pulmonary interstitial edema has increased from prior study of
07/20/2013.

## 2014-11-16 NOTE — Consult Note (Signed)
Brief Consult Note: Diagnosis: NSTEMI.   Patient was seen by consultant.   Consult note dictated.   Comments: Continue medical therapy. No plans for cath.  Electronic Signatures: Lorine Bears (MD)  (Signed 25-Dec-14 10:36)  Authored: Brief Consult Note   Last Updated: 25-Dec-14 10:36 by Lorine Bears (MD)

## 2014-11-16 NOTE — Consult Note (Signed)
PATIENT NAME:  Sharon [name], Sharon [name] MR#:  [id number] DATE OF BIRTH:  10-11-1915  DATE OF CONSULTATION:  07/02/2013  REFERRING PHYSICIAN:  Hope Pigeon. Elisabeth Pigeon, MD CONSULTING PHYSICIAN:  Muhammad A. Kirke Corin, MD  PRIMARY CARE PHYSICIAN:  Karie Schwalbe, MD   PRIMARY CARDIOLOGIST:  Antonieta Iba, MD  REASON FOR CONSULTATION: Chest pain.   HISTORY OF PRESENT ILLNESS: This is a 81 year old Caucasian female with known history of coronary artery disease. She had cardiac catheterization done in 2012 which showed 40% left main disease and 70% proximal LAD disease. Subsequent stress test showed anterior wall ischemia. She was treated medically due to her age and advanced chronic kidney disease. She was hospitalized in October of this year at [facility] due to respiratory failure thought to be due to congestive heart failure and non-ST elevation myocardial infarction. Peak troponin was 18. She had an echocardiogram done which showed an ejection fraction of 35%0 to 40%. She was again treated medically with no invasive procedures. She lives at [facility]. She was brought to the Emergency Room due to recurrent episodes of chest pain which she describes as sharp discomfort that is triggered by coughing and deep breaths. She denies any substernal tightness. Troponin was mildly elevated. EKG with underlying left bundle branch block.   PAST MEDICAL HISTORY: 1.  Coronary artery disease as outlined above.  2.  Congestive heart failure with an EF of 35% to 40%.  3.  Type 2 diabetes.  4.  Hypertension.  5.  Hyperlipidemia.  6.  Previous CVA.    7.  Previous retroperitoneal bleed, on antiplatelet therapy.  8.  Chronic kidney disease.   SOCIAL HISTORY: Negative for tobacco, alcohol or recreational drug use. She currently lives at [facility].   HOME MEDICATIONS:  Include: 1.  Vitamin D.  2.  Tramadol as needed.  3.  Ranexa 500 mg twice daily.  4.  Zofran 4 mg every 8 hours as needed.  5.   Omeprazole 40 mg once daily.  6.  Nitroglycerin 0.4 mg sublingual as needed.  7.  Metoprolol 25 mg 1/2 tablet 2 times daily.  8.  Insulin Lantus 25 units once daily.  9.  Isosorbide once daily.  10.  Furosemide twice daily.  11.  Famotidine twice daily.  12.  Colace.  13.  Atorvastatin.  14.  Aspirin 81 mg once daily.   REVIEW OF SYSTEMS: A 10-point  review of systems was performed. It is negative other than what is mentioned in the HPI.    PHYSICAL EXAMINATION: GENERAL: The patient appears to be younger than her stated age, in no acute distress.  VITAL SIGNS: Temperature 97.1, pulse 70, respiratory rate 20, blood pressure is 130/63 and oxygen saturation is 96% on room air.  HEENT: Normocephalic, atraumatic.  NECK: No JVD or carotid bruits.  RESPIRATORY: Normal respiratory effort with no use of accessory muscles. Auscultation reveals normal breath sounds.  CARDIOVASCULAR: Normal PMI. Normal S1 and S2 with no gallops or murmurs.  ABDOMEN: Benign, nontender and nondistended.  EXTREMITIES: No clubbing, cyanosis or edema.  SKIN: Warm and dry with no rash.  PSYCHIATRIC: She is alert and oriented  LABORATORY, DIAGNOSTIC AND RADIOLOGIC DATA: ECG shows sinus rhythm with left bundle branch block. Labs showed a creatinine of 2.20 which is at baseline. BNP was 9000. Troponin was 0.28.   IMPRESSION: 1.  Atypical chest pain, likely pleuritic in nature.  2.  Known history of coronary artery disease with mildly elevated troponin, likely supply/demand ischemia.  3.  Chronic systolic heart failure with an ejection fraction of 35% to 40%.  4.  Advanced chronic kidney disease.   RECOMMENDATIONS: The patient's chest pain is not suggestive of angina. It appears to be pleuritic. She has a chronically elevated troponin, likely due to chronic systolic heart failure and underlying chronic kidney disease. I agree with stopping heparin. Continue medical treatment for coronary artery disease. Cardiac  catheterization is not recommended due to her age and comorbidities. The patient can likely be discharged home tomorrow if no other issues.   ____________________________ [name], MD maa:cs D: 07/02/2013 10:11:00 ET T: 07/02/2013 14:49:21 ET JOB#: [id number]  cc: Muhammad A. Kirke Corin, MD, <Dictator> Iran Ouch MD ELECTRONICALLY SIGNED 07/03/2013 11:15

## 2014-11-16 NOTE — Discharge Summary (Signed)
PATIENT NAME:  Sharon [name], Sharon [name] MR#:  [id number] DATE OF BIRTH:  Apr 10, 1916  DATE OF ADMISSION:  07/01/2013 DATE OF DISCHARGE:  07/03/2013  For a detailed note, please take a look at the history and physical done on admission by Dr. Elisabeth Pigeon.      DIAGNOSES AT DISCHARGE: As follows, chest pain, likely musculoskeletal in nature related to a cough, history of coronary artery disease status post stent placement, history of chronic kidney disease stage III, hypertension, hyperlipidemia, diabetes, GERD, restless leg syndrome.   This patient is being discharged on a low-sodium, low-fat, American Diabetic Association diet.   ACTIVITY: As tolerated.   Follow up with Dr. Darrick Huntsman in the next 1 to 2 weeks.   DISCHARGE MEDICATIONS:  Vitamin D2, 50,000 International Units monthly; tramadol 50 mg q.8 hours as needed, Lantus 25 units at bedtime, metoprolol tartrate 12.5 mg b.i.d., Tussin DM 5 mL t.i.d. as needed, Colace 100 mg daily, Lasix 40 mg 1-1/2 tabs b.i.d., MiraLAX daily as needed, aspirin 81 mg daily, Imdur 30 mg daily, omeprazole 40 mg daily, Systane 1 drop to left eye q.i.d. x 2 days, pramipexole 0.5 mg at bedtime as needed, famotidine 20 mg b.i.d., nitroglycerin sublingual 1 tab q.5 minutes as needed, Zofran 4 mg q.8 hours as needed. Ranexa 5 mg b.i.d. and Ranexa additional tab at bedtime as needed.   CONSULTANTS DURING HOSPITAL COURSE:  Dr. Kirke Corin from cardiology.   PERTINENT STUDIES DONE DURING THE HOSPITAL COURSE: Are as follows: A chest x-ray done on admission showing cardiac enlargement, but no acute cardiopulmonary disease. An echocardiogram done on December 7th showing EF of 35% to 40%,, moderately decreased global LV function, mid to apical anterior septum and apex are abnormal. Moderate LVH, mildly dilated left atrium, mild mitral valve regurgitation, mild thickening and calcification of the posterior mitral valve.   HOSPITAL COURSE: This is a 81 year old female with medical problems as  mentioned above, presented to the hospital with chest pain and noted to have an elevated troponin.  1.  Chest pain. Most likely cause of the patient's chest pain is musculoskeletal in nature, as it got worse with her cough. She was admitted as she has a high risk for coronary artery disease and had an elevated troponin, although did not go as high as 0.2. She was seen by cardiology, who did not think that the patient had an acute coronary event, but likely her troponin was related to poor renal clearance from her chronic kidney disease. Given her advanced age, she is likely not a candidate for any aggressive intervention. Therefore, she is being managed medically with  aspirin, Ranexa, metoprolol and sublingual nitroglycerin as needed. The patient, on the day of discharge, was chest pain-free and hemodynamically stable and cleared from cardiologic standpoint to be discharged back to a skilled nursing facility.  2.  History of coronary artery disease with stent placement. The patient was maintained on aspirin, Imdur and Ranexa and beta blocker, and she will resume that.  3.  Chronic kidney disease stage III. The patient's creatinine was at baseline. No acute issue related to this. If she develops further renal dysfunction, she likely will not be a candidate for dialysis, at which point only hospice care and comfort care should be opted.  4.  Hypertension. The patient, when she presented to the hospital, was on the hypotensive side; therefore, her antihypertensives were held, although her blood pressures have rebounded and increased quite a bit. She will resume her home meds including metoprolol, Imdur, Lasix as  mentioned.  5.  Diabetes. The patient was maintained on her Lantus, and she will resume that.  6.  Restless leg syndrome: The patient was maintained on pramipexole, and she will also resume that upon discharge.   The patient is being discharged back to her assisted living facility.   The patient is a  DO NOT INTUBATE/DO NOT RESUSCITATE.   TIME SPENT WITH DISCHARGE: 40 minutes.   ____________________________ Rolly Pancake. Cherlynn Kaiser, MD vjs:dmm D: 07/03/2013 15:26:56 ET T: 07/03/2013 20:39:32 ET JOB#: [id number]  cc: Rolly Pancake. Cherlynn Kaiser, MD, <Dictator> Duncan Dull, MD Houston Siren MD ELECTRONICALLY SIGNED 07/06/2013 14:32

## 2014-11-16 NOTE — H&P (Signed)
PATIENT NAME:  Sharon [name], BARNARD MR#:  [id number] DATE OF BIRTH:  16-Feb-1916  DATE OF ADMISSION:  07/01/2013  PRIMARY CARE PHYSICIAN: Dr. Alphonsus Sias.  REFERRING EMERGENCY ROOM PHYSICIAN: Dr. Mindi Junker.   PRIMARY CARDIOLOGIST: Dr. Mariah Milling.  PRIMARY NEPHROLOGIST: Dr. Wynelle Link.  CHIEF COMPLAINT: Chest pain.   HISTORY OF PRESENTING ILLNESS: A 81 year old female who is very sharp for her age, lives in [place], sent in today for complaint of some chest pain and low blood pressure as per the nursing home. On questioning to patient, she said that for the last few days she has some cough, but it looks like that the sputum is stuck in her chest and is not able to bring it out, and that is causing her some discomfort and pain in her mid chest retrosternal area, and today when the nurse in [facility] checked her blood pressure it was low, so they decided to send her to the Emergency Room. The patient denies any palpitation or fever. She had some chest pain in October 2014 and she was in the Emergency Room with elevated troponin, transferred to [facility], and as per the patient, they did some angiogram and found she has 3-vessel disease and they do not want to do any further management surgically, or did not put any stent, and advised her for medication and sent her back. Her pain today is not similar to what she had in October, but because of her strong cardiac history and she was found having elevated troponin, she is being admitted on the medical service for further management of cardiac issues.   REVIEW OF SYSTEMS:    CONSTITUTIONAL: Negative for fever, fatigue, weakness, generalized pain.  EYES: No blurring, double vision, discharge or redness.  EARS, NOSE, THROAT: No tinnitus, ear pain or hearing loss.  RESPIRATORY: No wheezing or shortness of breath but has some cough, not able to bring out the sputum.  CARDIOVASCULAR: Has some chest pain retrosternal. No orthopnea, edema, palpitations.  GASTROINTESTINAL:  No nausea, vomiting, diarrhea, or abdominal pain.  GENITOURINARY: Dysuria, hematuria, or increased frequency.  ENDOCRINE: No nocturia. No heat or cold intolerance.  SKIN: No acne, no rashes or lesions on the skin.  MUSCULOSKELETAL: No pain or swelling in the joints.  NEUROLOGICAL: No numbness, weakness, tremor, or vertigo.  PSYCHIATRIC: No anxiety, insomnia, bipolar disorder.  PAST MEDICAL HISTORY:  1.  Diabetes, insulin-dependent.  2.  Hypertension.  3.  Hyperlipidemia.  4.  History of cerebrovascular accident and left-sided weakness in the past, but recovered totally and is currently able to walk with a walker.  5.  Hard of hearing.  6.  Macular degeneration.  7.  Congestive heart failure, unknown baseline.  8.  Chronic kidney disease, possibly stage IV.  9.  Coronary artery disease, being medically managed.   PAST SURGICAL HISTORY: Multiple, however, remembers bilateral knee surgery only.   FAMILY HISTORY: Mom with hypertension, father with chronic obstructive pulmonary disease, both with kidney problems.   SOCIAL HISTORY: Walks with a walker. No tobacco, alcohol, or drug abuse. Currently lives in [place].   HOME MEDICATIONS:  1.  Vitamin D2, 50,000 international units once a month.  2.  Tramadol 50 mg 1 tablet every 8 hours as needed for pain.  3.  Ranexa 500 mg oral tablet 1 tablet 2 times a day as needed for pain.  4.  Pramipexole 0.25 mg oral 2 tablets once a day.  5.  Ondansetron 4 mg every 8 hours.  6.  Omeprazole 40  mg once a day.  7.  Nitroglycerin 0.4 mg every 5 minutes as needed.  8.  Metoprolol 25 mg, take 1/2 tablet 2 times a day.  9.  Lantus 25 units once a day.  10.  Isosorbide mononitrate once a day.  11.  Furosemide 40 mg, take 1-1/2 tablets 2 times a day.  12.  Famotidine 20 mg oral 2 times a day.  13.  Docusate sodium 100 mg once a day.  14.  Atorvastatin 40 mg once a day.  15.  Aspirin enteric-coated 81 mg once a day.   PHYSICAL EXAMINATION:  VITAL  SIGNS: In ER, temperature 97.6, pulse 69, respirations 20, blood pressure 141/61, and pulse oximetry 94 on room air.  GENERAL: The patient is fully alert and oriented to time, place, and person and does not appear in any acute distress.  HEENT: Head and neck atraumatic. Conjunctivae pink. Oral mucosa moist.  NECK: Supple. No JVD.  RESPIRATORY: Bilateral clear and equal air entry.  CARDIOVASCULAR: S1, S2 present, regular. No murmur.  ABDOMEN: Soft, nontender. Bowel sounds present. No organomegaly.  SKIN: No rashes.  EXTREMITIES: Legs: Left ankle slightly edematous, as per patient is chronic.  NEUROLOGICAL: Power 4 out of 5. Follows commands. Moves all 4 limbs. Generalized weakness.  PSYCHIATRIC: Does not appear in any acute psychiatric illness at this time.   IMPORTANT LABORATORY AND RADIOLOGICAL DATA: Glucose 188. BNP 9744. BUN 26, creatinine 2.24, sodium 139, potassium 3.2, chloride 101, CO2 of 32. Troponin 0.28. WBC 10.2, hemoglobin 10.2. Chest x-ray portable showed cardiac enlargement, atherosclerosis, chronic interstitial coarsening. Echocardiogram in July 2013 showed ejection fraction more than 55%, impaired left ventricular relaxation.   ASSESSMENT AND PLAN: This is a 81 year old female with coronary artery disease on medical management. Came with cough and some chest pain, elevated troponin and elevated BNP level.  1.  Non-ST-elevation myocardial infarction: Her troponin appears to be chronically elevated, but due to her strong cardiac history, we will give her IV heparin drip at this time considering non-ST-elevation myocardial infarction and will call cardiology consult for further management of this issue, as she had blockages and suggested to have medical management. Will also continue her aspirin.  2.  Acute on chronic diastolic heart failure: The patient has ejection fraction of 55% as per echocardiogram done 1-1/2 years ago. There might be some component of systolic now after this long  time, but we would like to do echocardiogram and find it out. Meanwhile, we will give IV Lasix 20 mg b.i.d. and let the patient be followed by cardiologist in hospital.  3.  Coronary artery disease: We will continue aspirin, atorvastatin, nitroglycerin tablets as-needed basis and ranolazine, but will hold beta blockers due to blood pressure issues.  4.  Hypertension: As the patient presented with low blood pressure as per the nursing home, here blood pressure is under control and normal. Will hold beta blockers at this time. She will be receiving IV Lasix for congestive heart failure anyways.  5.  Diabetes: Will put her on sliding scale coverage insulin and if needed, she came be started back on her long-acting insulin what she was taking before.  6.  Code status: Is DO NOT RESUSCITATE as per the records nursing home.   TOTAL TIME SPENT IN THIS ADMISSION: 50 minutes.    ____________________________ Hope Pigeon Elisabeth Pigeon, MD vgv:jcm D: 07/01/2013 14:20:08 ET T: 07/01/2013 14:42:22 ET JOB#: [id number]  cc: Hope Pigeon. Elisabeth Pigeon, MD, <Dictator> Antonieta Iba, MD Karie Schwalbe, MD Heath Gold  Keelan Tripodi MD ELECTRONICALLY SIGNED 07/04/2013 9:22

## 2014-11-17 NOTE — Discharge Summary (Signed)
PATIENT NAME:  Sharon [name], Sharon [name] MR#:  [id number] DATE OF BIRTH:  1916-05-12  DATE OF ADMISSION:  07/20/2013 DATE OF DISCHARGE:  07/21/2013  ADMITTING DIAGNOSIS: Chest pain.   DISCHARGE DIAGNOSES: 1.  Chest pain due to possible non-ST myocardial infarction. Medical management recommended due to the patient's chronic kidney disease and advanced age. She is not a good candidate for any type of intervention. She had a cardiac catheterization done in 2012 which showed a 40% left main stenosis and 70% proximal left anterior descending stenosis.  2.  Chronic angina.  3.  Coronary artery disease.  4.  Chronic systolic heart failure with an ejection fraction of 35% to 40%.  5.  Type 2 diabetes.  6.  Hypertension.  7.  Hyperlipidemia.  8.  Previous cerebrovascular accident.  9.  Previous history of retroperitoneal bleed.  10.  Chronic kidney disease, which is stage IV with renal function close to baseline.  11.  Anemia of chronic disease.   CONSULTANTS: Dr. Juleen China, Dr. Holley Raring, Dr. Fletcher Anon.   PERTINENT LABS AND EVALUATIONS: Admitting LFTs showed an alk phos of 229, AST 17, ALT 14, and lipase 245, INR 0.9. BNP was 4820. Glucose was 355, BUN 28, creatinine 2.41, sodium 137, potassium 3.7, and CO2 is 34. WBC 8.4, hemoglobin 11.1, and platelet count 186. Troponin 0.29. PA and lateral chest x-ray showed cardiomegaly with pulmonary vascular congestion. Subsequent troponin went up to as high as 2.40. Cholesterol total 128, triglycerides 91, HDL 64, and LDL 46.   HOSPITAL COURSE: Please refer to H and P done by the admitting physician. The patient is a pleasant 81 year old Caucasian female who currently resides in assisted living. She appears much younger than her stated age, who has had recurrent admissions to the hospital for chest pain. She had a cardiac cath in 2012 which did show obstructive heart disease. However, she also has chronic kidney disease, stage IV. Therefore, whenever she is admitted with  elevated troponin she is medically managed and she seems to have done well, especially at her age of 71. She again was admitted with the same complaint of having chest pain and sent to the ED. Her troponin again was elevated. The patient was placed on a heparin drip. A cardiology consult was obtained. Dr. Fletcher Anon came and evaluated the patient and recommended medical management. He discussed with the family regarding her renal function and a cardiac cath, which would lead to the patient's renal function deteriorating. At this point, it is decided with the son that we will continue to medically manage her and also instruct the skilled nursing facility to do not have such a low threshold to send her to the ED. We recommend that she at least be treated with 3 sublingual nitroglycerins to see if her pain resolves. At this time, she is asymptomatic and is stable for discharge.   DISCHARGE MEDICATIONS:  1.  Vitamin D2 50,000 international units once a month. 2.  Ibuprofen 200 q. 12 p.r.n. for joint pains. 3.  Atorvastatin 20 at bedtime. 4.  Tramadol 50 one tab p.o. q. 8 p.r.n. 5.  Lantus 25 units at bedtime. 6.  Ranexa 500 one tab p.o. b.i.d. 7.  Colace 100 daily. 8.  Lasix 60 mg 2 times a day. 9.  MiraLax 17 grams daily. 10.  Isosorbide mononitrate 30 daily. 11.  Omeprazole 40 daily. 12.  Systane ophthalmic solution 1 drop to left eye 4 times a day. 13.  Pramipexole 0.25 two tabs once a day at bedtime, 1  tab if needed for total of 3 tablets a day. 14.  Zofran 4 mg q. 8 p.r.n. for nausea. 15.  Nitroglycerin 0.4 sublingual every 5 minutes as needed. Please give 3 tablets five minutes apart. If the symptoms do not resolve, then consider bringing the patient to the Emergency Room. 16.  Aspirin 325 daily. 17.  Metoprolol tartrate 25 mg 1 tab p.o. b.i.d.  18.  Famotidine 20 one tab p.o. b.i.d.   DIET: Low sodium, low fat, low cholesterol.   ACTIVITY: As tolerated.   DISCHARGE INSTRUCTIONS: Follow up  with Dr. Rockey Situ in 2 to 4 weeks.  TIME SPENT ON DISCHARGE: 32 minutes. ____________________________ Lafonda Mosses Posey Pronto, MD shp:sb D: 07/21/2013 15:20:48 ET T: 07/21/2013 15:48:19 ET JOB#: [id number]  cc: Delberta Folts H. Posey Pronto, MD, <Dictator> Alric Seton MD ELECTRONICALLY SIGNED 07/30/2013 8:31

## 2014-11-17 NOTE — H&P (Signed)
PATIENT NAME:  Sharon [name], Sharon [name] MR#:  [id number] DATE OF BIRTH:  1916-01-30  DATE OF ADMISSION:  07/20/2013  PRIMARY CARE PHYSICIAN: Dr. Tillman Abide.  PRIMARY CARDIOLOGIST: Dr. Julien Nordmann.   REFERRING PHYSICIAN:   .  CHIEF COMPLAINT: Chest pain.   HISTORY OF PRESENT ILLNESS: Sharon [name] is a 81 year old pleasant, white female, has known history of coronary artery disease with a recent heart catheter done in 2012 revealed 40% of left main disease and 70% proximal LAD. Recent echocardiograms from October 2014 showed ejection fraction of 35% to 40%. The patient was admitted on 07/11/2013 with chest pain and mild elevation of the troponin of 0.2, and was seen by Dr.  , who felt considering the patient's age, the patient will need medical therapy and continued with Ranexa, metoprolol, Imdur, and  aspirin. The patient is usually independent of activities of daily living, lives at [facility] assisted living facility. The patient had been at her son's house and was coming back to her assisted living facility. After car was stopped, was walking into the facility,  started to experience pain in the midsternal area 10/10 intensity. This was associated with nausea. Initially, thought dyspepsia and was given different sodas without much improvement. The patient was given one nitroglycerin with good improvement of the pain. The patient had one episode of vomiting. Does not remember if the patient had any shortness of breath. Concerning this, the patient is brought to the Emergency Department. Initial set of cardiac enzymes were troponin 0.29; however, the second set of cardiac enzymes came back as 1.29. Considering the patient's known history of coronary artery disease, the decision is made to admit the patient. The patient is initiated on heparin drip in the Emergency Department. The patient currently denies having any chest pain, nausea, vomiting.   PAST MEDICAL HISTORY: 1. Hypertension.  2.  Hyperlipidemia.  3. Diabetes mellitus, insulin-dependent.  4. Coronary artery disease   of 40% left main, and 70% proximal LAD per left heart cath from 2012. Had  a stress test, showed anterior wall Ischemia. Recent echocardiogram with ejection fraction of 35% to 40%.  5. Congestive heart failure, systolic.  6. Previous history of CVA.  7. Previous history of retroperitoneal bleed on antiplatelet therapy.  8. Chronic kidney disease with a baseline creatinine of 2.07.  9. Gastroesophageal reflux disease.  ALLERGIES: CODEINE AND LEVAQUIN.   HOME MEDICATIONS:  1. Vitamin D2 50,000 units once a month.  2.    3. Tramadol 50 mg every eight hours as needed.  4.   ophthalmic drops 4 times a day.  5. Ranexa 500 mg 1 tablet 2 times a day.  6. Pramipexole 0.25 mg 2 tablets once a day.  7. Zofran 4 mg every 8 hours. 8. Omeprazole 40 mg once a day.  9. Nitroglycerin 0.4 mg every five minutes as needed.  10. MiraLAX as needed.  11. Metoprolol 12.5 mg 2 times a day.  12. Lantus 25 units once a day.  13. Imdur 30 mg once a day.  14. Lasix 60 mg 2 times a day.  15. Famotidine 20 mg 2 times a day.  16. Docusate sodium 1 tablet once a day.  17. Aspirin 81 mg once a day.   SOCIAL HISTORY: No history of smoking, drinking alcohol or using illicit drugs. Currently lives in assisted living facility, has a son, [place] the city.  Walks with the help of a walker.   FAMILY HISTORY: Mother with hypertension. Father with COPD and  chronic kidney disease.   REVIEW OF SYSTEMS: CONSTITUTIONAL: Denies any fatigue.  EYES: No change in vision.  EARS, NOSE, THROAT: No change in hearing. No sore throat.  RESPIRATORY: Experiences shortness of breath with exertion. Denies any cough.  CARDIOVASCULAR: Came in with chest pain.   GASTROINTESTINAL: Had nausea or vomiting.  GENITOURINARY: No dysuria or hematuria.  ENDOCRINE: Has known history of diabetes mellitus.  HEMATOLOGIC: No easy bruising or bleeding.   SKIN: No rash or lesions.  MUSCULOSKELETAL: Has osteoarthritis.  NEUROLOGICAL: No weakness or numbness in any part of the body.   PHYSICAL EXAMINATION: GENERAL: This is a well-built, well-nourished, age-appropriate female lying down in the bed, not in distress.  VITAL SIGNS: Temperature 98, pulse 66, blood pressure 153/60, respiratory rate of 11, oxygen saturation is 97% on room air.  HEENT:  Normocephalic atraumatic,   conjunctivae normal. Pupils equal and react to light. Extraocular movements intact. Mucous membranes moist.   NECK: Supple. No lymphadenopathy. No JVD. No carotid bruit. No thyromegaly.  CHEST: Has no focal tenderness. Somewhat decreased breath sounds in the lower lobes.  HEART: S1 and S2, no murmurs are heard. No pedal edema. Pulses 2+.  ABDOMEN: Obese. Bowel sounds present. Soft, nontender, nondistended. No hepatosplenomegaly.  SKIN: No rash or lesions.  MUSCULOSKELETAL: Good range of motion in all the extremities.  NEUROLOGICAL: The patient is alert, oriented to place, person and time. Cranial nerves II through XII intact. Motor 5/5 in upper and lower extremities.   LABORATORIES:  CMP: Glucose . BNP 4800, BUN 28, creatinine of 2.41.   CBC: WBC of 7.1, hemoglobin 9.9, platelet count of 241.   Troponin 1.40, CK-MB 5.5.   EKG 12-lead: Left bundle branch block.   ASSESSMENT AND PLAN: Sharon [name] is a 81 year old female with known history of coronary artery disease by heart catheterization from 2012, ischemic cardiomyopathy with ejection fraction of 35% to 40% who comes to the Emergency Department with complaints of chest pain with elevated troponins of 1.40.  1. Non-ST-elevation myocardial infarction. As mentioned above, the patient has a known history of coronary artery disease. The patient has chronic kidney disease. We will keep the patient on heparin drip. Consulted cardiology. The patient follows with Dr. Mariah Milling. Keep the patient on aspirin. Continue the Ranexa,  Imdur.  2. Diabetes mellitus. The patient states ate a large meal, as well as a cake, which could be contributing to her high blood sugars. We will continue with the home dose and follow up on sliding scale insulin.  3. Hypertension, currently moderately controlled. Continue with the current medication.  4. The patient will be on heparin drip which should cover for the deep vein thrombosis prophylaxis as well.   TIME SPENT: 50 minutes.    ____________________________ Susa Griffins, MD pv:sg D: 07/20/2013 07:10:00 ET T: 07/20/2013 07:38:53 ET JOB#: [id number]  cc: Susa Griffins, MD, <Dictator> Karie Schwalbe, MD  Susa Griffins MD ELECTRONICALLY SIGNED 08/04/2013 21:14

## 2014-11-17 NOTE — Discharge Summary (Signed)
PATIENT NAME:  Sharon [name], Sharon [name] MR#:  [id number] DATE OF BIRTH:  24-Sep-1915  DATE OF ADMISSION:  08/13/2013 DATE OF DISCHARGE:  08/17/2013  ADMITTING DIAGNOSIS: Diarrhea, nausea and dry heaving.  DISCHARGE DIAGNOSES: 1.  Acute non-ST myocardial infarction with known coronary artery disease. Due to the patient's chronic renal failure and age, medical management is recommended. The patient has had multiple admissions for same, now hospice is involved.  2.  Urinary tract infection due to extended spectrum beta-lactamase urinary tract infection, treated with Invanz. She will get a total of 5 days.  3.  Nausea and vomiting could be related to the myocardial infarction as well as urinary tract infection, now resolved.  4.  Acute on chronic respiratory failure, possibly due to acute systolic congestive heart failure.  5.  Diabetes.  6.  Chronic kidney disease.  7.  Electrolyte imbalances.  8.  Chronic anemia.  9.  Hyperlipidemia.  10. Previous history of cerebrovascular accident.  11. Previous history of rectal perineal bleed.  12. Gastroesophageal reflux disease.   CONSULTANTS: Cardiology, Dr. Kirke Corin.   PERTINENT LABS AND EVALUATIONS: Glucose 227, BUN 33, creatinine 2.20, sodium 138, potassium 3.7, chloride 103, CO2 is 31, calcium 8.0. LFTs: Total protein 6.3, albumin 3, bilirubin total 0.4, alkaline phosphatase 120, AST 20, ALT was 17. Troponin 0.47, then 2.20, then 5.20. WBC 10.1, hemoglobin 11.6, platelet count 188. Urine culture showed greater than 100,000 Escherichia coli, ESBL producing. Urinalysis: Nitrites trace, WBCs 26.   HOSPITAL COURSE: Please refer to H and P done by the admitting physician. The patient is a 81 year old well known to our service with history of coronary artery disease, who also has chronic renal failure and has been admitted multiple times with chest pain, who presented with diarrhea, nausea and retching episode. The patient, again was noted to have elevated cardiac  enzymes consistent with a non-Q MI. She was placed on heparin and seen by cardiology as previous times it was recommended to treat her medically. She also was noted to have a urinary tract infection, which was treated with Invanz due to ESBL producing bacteria. The patient will be continued on this IM for 2 more days. The patient also was noted to have respiratory difficulties. A chest x-ray did reveal that she has possible congestive heart failure, which was treated with IV diuresis. Also, due to her recurrent admissions, palliative care consult was obtained. The patient was DNR from before, but at this point discussions were held with the son and she will have hospice follow her at assisted living facility. At this time, she is stable for discharge.   DISCHARGE MEDICATIONS: Vitamin D2 50,000 international units once a month, Ranexa 500 mg 1 tab p.o. b.i.d., Systane ophthalmic solution 1 drop to left eye 4 times a day, Lantus 25 at bedtime, Mirapex 0.5 at bedtime, omeprazole 40 daily, Imdur 80 mg once a day, aspirin 81 mg 1 tab p.o. daily, Lipitor 40 daily, MiraLax 17 grams p.o. daily p.r.n. for constipation, Lasix 60 mg daily, Colace 100 daily, senna 1 tab p.o. b.i.d., alprazolam 0.25 q.8 hours p.r.n., metoprolol tartrate 25 mg 1 tab p.o. b.i.d., Mucinex 600, 1 tab p.o. b.i.d., Tylenol 650 q.4 hours p.r.n. for pain and Invanz 1 gram IM x 2 days.   DIET: Low fat, low cholesterol.   ACTIVITY: As tolerated.   FOLLOWUP: With primary M.D. in 1 to 2 weeks. Referrals to home hospice have been made.   TIME SPENT: 35 minutes spent on the discharge. ____________________________ Lacie Scotts.  Allena Katz, MD shp:aw D: 08/18/2013 08:39:20 ET T: 08/18/2013 08:49:03 ET JOB#: [id number]  cc: Loretta Doutt H. Allena Katz, MD, <Dictator> Charise Carwin MD ELECTRONICALLY SIGNED 08/18/2013 17:09

## 2014-11-17 NOTE — Consult Note (Signed)
   Comments   I spoke with pt's son, [name] Caddell. Dr Blanca Friend is well versed in hospice and end of life care. He wants to stop recurrent hospitalizations for pt and wants hospice involvement for her at [facility]. He is very appreciative of the care given his mother at this hospital but feels that hospitalization should be avoided if possible.  son was on the original board of [facility] [address] and requests this hospice agency. CM aware and will facilitate.   Electronic Signatures: Abed Schar, Harriett Sine (MD)  (Signed 21-Jan-15 19:40)  Authored: Palliative Care   Last Updated: 21-Jan-15 19:40 by Tanya Marvin, Harriett Sine (MD)

## 2014-11-17 NOTE — Consult Note (Signed)
PATIENT NAME:  Sharon [name], Sharon [name] MR#:  [id number] DATE OF BIRTH:  1915-09-21  DATE OF CONSULTATION:  07/20/2013  REFERRING PHYSICIAN:  Dr. Elisabeth Pigeon.  CONSULTING PHYSICIAN:   Selena Swaminathan A. Kirke Corin, MD  PRIMARY CARE PHYSICIAN:  Dr. Alphonsus Sias.  PRIMARY CARDIOLOGIST:  Dr. Mariah Milling.   REASON FOR CONSULTATION:  Myocardial infarction.   HISTORY OF PRESENT ILLNESS:  This is a 81 year old female with known history of coronary artery disease, which is being treated medically. She had a cardiac catheterization done in 2012, which showed 40% left main stenosis and 70% proximal LAD stenosis. Subsequent stress test showed anterior wall ischemia but she was treated medically due to her age and advanced chronic kidney disease. She was hospitalized in October of this year at [facility] due to respiratory failure thought to be due to congestive heart failure and non-ST elevation myocardial infarction. Echocardiogram showed an ejection fraction of 35% to 40%. She was also hospitalized early this month here at [facility] with atypical chest pain and slightly elevated cardiac enzymes. She has been treated medically. The patient was at her son's house yesterday for [place] dinner and on the way back to [facility], she had to walk to get to her unit. She was very is short of breath with walking. When she was back, she started having substernal chest pain, which responded to one sublingual nitroglycerin. She then threw up after that, and due to that she was brought to the Emergency Room where she was found to have elevated troponin. The troponin subsequently went up to 2.4. She is currently chest pain-free. She has chronic left bundle branch block on her EKG.   PAST MEDICAL HISTORY:  1.  Coronary artery disease as outlined above.  2.  Chronic systolic heart failure with an ejection fraction of 35% to 40%.  3.  Type 2 diabetes.  4.  Hypertension.  5.  Hyperlipidemia.  6.  A previous CVA.  7.  A previous  retroperitoneal bleed.  8.  Chronic kidney disease.   SOCIAL HISTORY:  Negative for smoking, alcohol or recreational drug use.   FAMILY HISTORY:  Negative for premature coronary artery disease.   REVIEW OF SYSTEMS:  A 10-point review of systems was performed. It is negative other than what is mentioned in the HPI.   HOME MEDICATIONS:  Reviewed.   PHYSICAL EXAMINATION:  VITAL SIGNS:  Temperature is 98.1, pulse is 78, respiratory rate is 18, blood pressure is 172/76, and oxygen saturation is 93% on room air.  HEENT:  Normocephalic, atraumatic.  NECK:  No JVD or carotid bruits.  RESPIRATORY:  Normal respiratory effort with no use of accessory muscles. Auscultation reveals a few crackles at the base.  CARDIOVASCULAR:  Normal PMI. Normal S1 and S2 with no gallops. There is a 2/6 systolic ejection murmur at the base of the heart.  ABDOMEN:  Benign, nontender, and nondistended.  EXTREMITIES:  No clubbing, cyanosis. There is trace edema bilaterally.   LABORATORY, DIAGNOSTIC, AND RADIOLOGICAL DATA:  EKG showed normal sinus rhythm with left bundle branch block. The labs showed a BNP of 4800. Creatinine was 2.4. Troponin was 0.29 and subsequently went up to 2.4.   IMPRESSION:  1.  Non-ST elevation myocardial infarction with known history of coronary artery disease.  2.  Acute on chronic systolic heart failure with an ejection fraction of 35% to 40%.  3.  Advanced chronic kidney disease.   RECOMMENDATIONS:  1.  The patient had multiple recent admissions for cardiac events and ischemic  heart disease. We have determined that the risks of proceeding with invasive therapy outweigh the benefit due to her age and multiple comorbidities, especially her advanced chronic kidney disease. Due to that, medical therapy is recommended. I also discussed with the patient and her family about the utility of frequent hospital admissions, especially that we know that no further treatment beyond medical therapy will be  offered. Thus, there should be a high threshold to bring her into the hospital for evaluation. I suggested that the patient does not come to the hospital unless the chest pain does not respond to three sublingual nitroglycerins. 2.  Regarding her medications, I will increase the dose of metoprolol to 25 mg twice daily and also the dose of Imdur to 60 mg once daily.  3.  Continue heparin for another 24 hours and this can be discontinued tomorrow.  4.  Continue treatment with aspirin but no Plavix given her previous history of bleeding in the past. She is also on Ranexa for chronic angina and this should be continued. 5.  For her heart failure, an ACE inhibitor is not recommended due to advanced chronic kidney disease. She does appear to be mildly fluid overloaded and we should continue with oral diuretics. The patient can likely be discharged back tomorrow.   ____________________________ [name], MD maa:jm D: 07/20/2013 10:35:52 ET T: 07/20/2013 11:03:28 ET JOB#: [id number]  cc: Maxi Carreras A. Kirke Corin, MD, <Dictator> Iran Ouch MD ELECTRONICALLY SIGNED 08/14/2013 9:59

## 2014-11-17 NOTE — Consult Note (Signed)
General Aspect This is a 81 year old female with known history of coronary artery disease, which is being treated medically. She had a cardiac catheterization done in 2012, which showed 40% left main stenosis and 70% proximal LAD stenosis. Subsequent stress test showed anterior wall ischemia but she was treated medically due to her age and advanced chronic kidney disease. She was hospitalized in October of this year at [facility] due to respiratory failure thought to be due to congestive heart failure and non-ST elevation myocardial infarction. Echocardiogram showed an ejection fraction of 35% to 40%. She was also hospitalized twice in December here at [facility] with atypical chest pain and slightly elevated cardiac enzymes. She has been treated medically.  She patient lives at [facility]. She was brought due to nausea, vomitting and diarrhea. She denies chest pain or dyspnea. TnI was found to be elevated.   Present Illness PAST MEDICAL HISTORY:  1.  Coronary artery disease as outlined above.  2.  Chronic systolic heart failure with an ejection fraction of 35% to 40%.  3.  Type 2 diabetes.  4.  Hypertension.  5.  Hyperlipidemia.  6.  A previous CVA.  7.  A previous retroperitoneal bleed.  8.  Chronic kidney disease.   Physical Exam:  GEN no acute distress   RESP normal resp effort   CARD Regular rate and rhythm  No murmur   ABD denies tenderness   EXTR negative cyanosis/clubbing, negative edema   PSYCH alert, A+O to time, place, person   Review of Systems:  Subjective/Chief Complaint nausea   General: No Complaints   Skin: No Complaints   ENT: No Complaints   Eyes: No Complaints   Neck: No Complaints   Respiratory: No Complaints   Cardiovascular: No Complaints   Gastrointestinal: Nausea  Diarrhea   Genitourinary: No Complaints   Vascular: No Complaints   Musculoskeletal: No Complaints   Neurologic: No Complaints   Hematologic: No Complaints    Endocrine: No Complaints   Review of Systems: All other systems were reviewed and found to be negative   Home Medications: Medication Instructions Status  Ranexa 500 mg oral tablet, extended release 1 tab(s) orally 2 times a day (7am, 7pm) Active  Systane - ophthalmic solution 1 drop(s) to left eye 4 times a day for 2 days after monthly eye injection (7am, 12n, 4pm, 7pm). Active  Lantus 100 units/mL subcutaneous solution 25 unit(s) subcutaneous once a day (at bedtime) Active  Mirapex 0.5 mg oral tablet  orally once a day (at bedtime) Active  omeprazole 40 mg oral delayed release capsule 1 cap(s) orally once a day Active  metoprolol 12.5 milligram(s) orally 2 times a day Active  Imdur 80 milligram(s) orally once a day Active  aspirin 81 mg oral tablet 1 tab(s) orally once a day Active  Lipitor 40 mg oral tablet 1 tab(s) orally once a day (at bedtime) Active  MiraLax 17 grams po PRN constipation Active  Lasix 40 mg oral tablet 1.5 tab(s) orally once a day take at 1500 every day Active  Lasix 60 milligram(s) orally once a day take at 0700 each day Active  Colace sodium 100 mg oral capsule  orally once a day Active  Vitamin D2 50,000 intl units (1.25 mg) oral capsule 1 cap(s) orally once a month on the 18th (7am) Active   Lab Results: Routine Chem:  18-Jan-15 02:53   Result Comment TROPONIN - RESULTS VERIFIED BY REPEAT TESTING.  - ELEVATED TROPONIN PREVIOUSLY CALLED AT  -  2316 08/12/13.[facility]  Result(s) reported on 13 Aug 2013 at 04:15AM.  Glucose, Serum  195  BUN  32  Creatinine (comp)  2.05  Sodium, Serum 139  Potassium, Serum 3.6  Chloride, Serum 105  CO2, Serum 30  Calcium (Total), Serum  7.7  Anion Gap  4  Osmolality (calc) 290  eGFR (African American)  23  eGFR (Non-African American)  20 (eGFR values <60m/min/1.73 m2 may be an indication of chronic kidney disease (CKD). Calculated eGFR is useful in patients with stable renal function. The eGFR calculation will not be  reliable in acutely ill patients when serum creatinine is changing rapidly. It is not useful in  patients on dialysis. The eGFR calculation may not be applicable to patients at the low and high extremes of body sizes, pregnant women, and vegetarians.)  Magnesium, Serum  1.4 (1.8-2.4 THERAPEUTIC RANGE: 4-7 mg/dL TOXIC: > 10 mg/dL  -----------------------)    07:07   Result Comment TROPONIN - RESULTS VERIFIED BY REPEAT TESTING.  - PREVIOUSLY CALLED AT 2316 08/12/13 BY [facility]  Result(s) reported on 13 Aug 2013 at 0[facility]  Cardiac:  18-Jan-15 02:53   Troponin I  2.20 (0.00-0.05 0.05 ng/mL or less: NEGATIVE  Repeat testing in 3-6 hrs  if clinically indicated. >0.05 ng/mL: POTENTIAL  MYOCARDIAL INJURY. Repeat  testing in 3-6 hrs if  clinically indicated. NOTE: An increase or decrease  of 30% or more on serial  testing suggests a  clinically important change)  CPK-MB, Serum  13.0 (Result(s) reported on 13 Aug 2013 at 03:38AM.)    07:07   Troponin I  5.20 (0.00-0.05 0.05 ng/mL or less: NEGATIVE  Repeat testing in 3-6 hrs  if clinically indicated. >0.05 ng/mL: POTENTIAL  MYOCARDIAL INJURY. Repeat  testing in 3-6 hrs if  clinically indicated. NOTE: An increase or decrease  of 30% or more on serial  testing suggests a  clinically important change)  CPK-MB, Serum  19.7 (Result(s) reported on 13 Aug 2013 at 07:35AM.)  Routine Coag:  18-Jan-15 04:53   Activated PTT (APTT) 26.7 (A HCT value >55% may artifactually increase the APTT. In one study, the increase was an average of 19%. Reference: "Effect on Routine and Special Coagulation Testing Values of Citrate Anticoagulant Adjustment in Patients with High HCT Values." American Journal of Clinical Pathology 2006;126:400-405.)  Routine Hem:  18-Jan-15 02:53   WBC (CBC) 8.4  RBC (CBC)  3.57  Hemoglobin (CBC)  10.6  Hematocrit (CBC)  31.7  Platelet Count (CBC) 174  MCV 89  MCH 29.8  MCHC 33.6  RDW  15.0  Neutrophil % 89.0   Lymphocyte % 6.6  Monocyte % 3.1  Eosinophil % 0.9  Basophil % 0.4  Neutrophil #  7.5  Lymphocyte #  0.6  Monocyte # 0.3  Eosinophil # 0.1  Basophil # 0.0 (Result(s) reported on 13 Aug 2013 at 03:27AM.)   EKG:  EKG NSR   Abnormal LBBB   EKG Comparision Not changed from    Codeine: Unknown  Levaquin: Unknown  Vital Signs/Nurse's Notes: **Vital Signs.:   18-Jan-15 08:12  Vital Signs Type Routine  Temperature Temperature (F) 98.7  Celsius 37  Temperature Source oral  Pulse Pulse 100  Respirations Respirations 31  Systolic BP Systolic BP 1161 Diastolic BP (mmHg) Diastolic BP (mmHg) 55  Mean BP 77  Pulse Ox % Pulse Ox % 98  Pulse Ox Activity Level  At rest  Oxygen Delivery 2L  Pulse Ox Heart Rate 100  Nurse Fingerstick (mg/dL) FSBS (  fasting range 65-99 mg/dL) 186    Impression 1. NSTEMI: it is difficult to determine if this is due to supply demand ischemia as she was tachycardiac on presentation or due to a true MI manifested with GI symptoms. Regardless, we have recommended medical therapy for her CAD given her age and CKD.  Continue Heparin for another 24 hours. She is already on good medical therapy for CAD.   2. CKD: seems to be stable.   Electronic Signatures: Kathlyn Sacramento (MD)  (Signed 18-Jan-15 10:23)  Authored: General Aspect/Present Illness, History and Physical Exam, Review of System, Home Medications, Labs, EKG , Allergies, Vital Signs/Nurse's Notes, Impression/Plan   Last Updated: 18-Jan-15 10:23 by Kathlyn Sacramento (MD)

## 2014-11-17 NOTE — H&P (Signed)
PATIENT NAME:  Sharon [name], Sharon [name] MR#:  [id number] DATE OF BIRTH:  06/20/1916  DATE OF ADMISSION:  08/13/2013  REFERRING PHYSICIAN:  Dr. Francene Castle.  PRIMARY CARE PHYSICIAN:  Dr. Viviana Simpler.   PRIMARY CARDIOLOGIST:  Dr. Rockey Situ.   CHIEF COMPLAINT:  Diarrhea, nausea, dry heaving.   HISTORY OF PRESENT ILLNESS:  This 81 year old female nursing home resident, recently discharged on December 26 with non-ST-elevated MI, with a recommendation of medical management, the patient presents today with above-mentioned complaints, her complaints started after lunch, started to have abdominal pain, diffuse cramping quality as well diarrhea and nausea.  She reports she did not actually vomit, but she did have dry heaving and she could not eat anything, upon presentation, the patient was afebrile, had no leukocytosis, she had positive urinalysis, as well she was complaining of dysuria and polyuria, as well the patient is known to have history of chronic kidney disease around her baseline with a creatinine of 2, as well she is known to have history of chronic elevated troponin, her troponin was elevated at 0.47 today, last time upon discharge it was around 2.4, but her baseline usually running around 0.2 to 0.4, she currently denies any chest pain, but reports earlier during the day she had chest discomfort, but reports she is known to have history of angina which is usual for her, hospitalist service were requested to admit the patient for hydration.   PAST MEDICAL HISTORY: 1.  Hypertension.  2.  Hyperlipidemia.  3.  Diabetes mellitus, insulin-dependent.  4.  Coronary artery disease.  5.  Congestive heart failure, systolic.  6.  Previous history of CVA.  7.  Previous history of retroperitoneal bleed, on antiplatelet therapy.  8.  Chronic kidney disease with baseline creatinine around 2.  9.  Gastroesophageal reflux disease.   ALLERGIES:  CODEINE AND LEVAQUIN.   SOCIAL HISTORY:  No history of smoking,  drinking alcohol or using illicit drugs.  She lives in assisted living facility.  Her [place] in the city.  The patient ambulates with assistance of a walker.   FAMILY HISTORY:  Significant for hypertension and father having history of COPD and chronic kidney disease.   HOME MEDICATIONS: 1.  Aspirin 81 mg daily.  2.  Imdur 30 mg daily.  3.  Ranexa 500 mg 2 times a day.  4.  Lantus 25 units subQ at bedtime.  5.  Lipitor 40 mg at bedtime.  6.  Mirapex 0.5 mg at bedtime.  7.  Metoprolol 12.5 mg oral 2 times a day.  8.  Lasix 60 mg oral daily. 9.  Colace 100 mg oral daily.  10.  MiraLAX daily as needed.  11.  Systane ophthalmic solution one drop to the left eye 4 times a day as needed.  12.  Omeprazole 40 mg oral daily.  13.  Vitamin D2 2,000 international units every month on the 18th.   REVIEW OF SYSTEMS:  CONSTITUTIONAL:  The patient denies fever, chills.  Complains of fatigue, weakness.  Denies weight gain, weight loss.  EYES:  Denies blurry vision, double vision, inflammation or glaucoma.  EARS, NOSE, THROAT:  Denies tinnitus, ear pain, hearing loss, epistaxis or discharge.  RESPIRATORY:  Denies cough, wheezing, hemoptysis, dyspnea or COPD. CARDIOVASCULAR:  The patient denies chest pain. but Reports chest  discomfort earlier during the day.  No edema.  No arrhythmia.  No palpitation.  No syncope.  GASTROINTESTINAL:  Denies hematemesis, coffee-ground emesis, bright red blood per rectum, jaundice, rectal bleed.  Reports  nausea, dry heaving, vomiting and cramping abdominal pain.  Currently abdominal pain resolved.   GENITOURINARY:  Denies hematuria, renal colic.  Reports dysuria and polyuria.  ENDOCRINE:  Denies polyuria, polydipsia, heat or cold intolerance.  HEMATOLOGY:  Denies anemia, easy bruising, bleeding diathesis.  INTEGUMENTARY:  Denies acne, rash or skin lesion.  MUSCULOSKELETAL:  Denies any gout, cramps.  Reports history of arthritis.  NEUROLOGIC:  History of CVA in the  past.  Denies headache, vertigo, tremor.  PSYCHIATRIC:  Denies anxiety, insomnia, schizophrenia.   PHYSICAL EXAMINATION: VITAL SIGNS:  Temperature 98.2, pulse 113, respiratory rate 16, blood pressure 154/72, saturating 96% on oxygen.  GENERAL:  Elderly chronically appearing female who looks comfortable in bed in no apparent distress.  HEENT:  Head atraumatic, normocephalic.  Pupils equal, reactive to light.  Pink conjunctivae.  Anicteric sclerae.  Dry oral mucosa with cracked lips.  NECK:  Supple.  No thyromegaly.  No JVD.  No carotid bruits.  CHEST:  Good air entry bilaterally.  No wheezing, rales or rhonchi.  CARDIOVASCULAR:  S1, S2 heard.  No rubs, murmur, gallops.  Tachycardic, but regular.  ABDOMEN:  Has diffuse mild tenderness.  No rebound, no guarding.  Bowel sounds present all four quadrants.  No hepatomegaly could be appreciated.  EXTREMITIES:  Mild pedal edema bilaterally.  No clubbing.  No cyanosis.  Pedal pulses felt bilaterally.  PSYCHIATRIC:  Appropriate affect.  Awake, alert x 3.  Intact judgment and insight.  Pleasant.  NEUROLOGIC:  Cranial nerves grossly intact.  MOTOR:  No focal deficits.  LYMPHATIC:  No cervical lymphadenopathy could be appreciated.  MUSCULOSKELETAL:  No joint effusion or erythema.   PERTINENT LABORATORY DATA:  Glucose 227, BUN 33, creatinine 2.2, sodium 138, potassium 3.7, chloride 103, CO2 31, ALT 17, AST 20, alk phos 120.  Albumin 3.  Troponin 0.47, white blood cells 10.1, hemoglobin 11.6, hematocrit 35.2, platelets 188.  Urinalysis showing positive nitrites and trace leukocyte esterase and 26 white blood cells.  EKG showing a left bundle branch block with sinus tachycardia at 121 beats per minute and the left bundle branch block is old.   ASSESSMENT AND PLAN: 1.  Nausea, vomiting and diarrhea, this is most likely related to viral gastroenteritis disease, possibly as well related to urinary tract infection, the patient currently abdominal pain much  improved.  We will check abdominal x-ray, we will have her on IV fluids, as needed nausea and pain medicine.  2.  Urinary tract infection.  We will continue the patient on Rocephin.  3.  Chronically elevated troponin with chest discomfort, the patient is known to have history of angina, the patient will be admitted to telemetry.  We will continue to cycle her cardiac enzymes and follow the trend.  She is already on aspirin, beta blockers, Ranexa and Imdur.  4.  Hypertension.  Blood pressure mildly elevated.  We will continue with home medications includes metoprolol.  5.  Hyperlipidemia.  Continue with statin.  6.  Diabetes mellitus.  We will hold long-acting insulin Lantus.  We will keep her on insulin sliding scale, depends on how she will tolerate by mouth.  If she does good with that, she can resume her back on long-acting insulin.  7.  History of congestive heart failure, systolic.  Currently appears compensated given her clinical dehydration.  We will hold will hold her Lasix.  We will keep her on IV fluids.  8.  History of cerebrovascular accident in the past.  Continue with aspirin.  9.  Chronic kidney disease, appears to be at baseline.  Continue with fluids.  10.  Gastroesophageal reflux disease.  Continue with PPI.  11.  Deep vein thrombosis prophylaxis.  SubQ heparin.  12.  CODE STATUS:  DISCUSSED WITH THE PATIENT, REPORTS SHE HAS A LIVING WILL.  HER SON IS HER HEALTHCARE POWER OF ATTORNEY, REPORTS SHE IS DO NOT RESUSCITATE.  AS WELL, THE PATIENT HAS A DO NOT RESUSCITATE FORM IN HER CHART FROM ASSISTED LIVING FACILITY.   Total time spent on admission and patient care 55 minutes.    ____________________________ Albertine Patricia, MD dse:ea D: 08/13/2013 00:29:49 ET T: 08/13/2013 01:21:23 ET JOB#: [id number]  cc: Albertine Patricia, MD, <Dictator> Brightyn Mozer Graciela Husbands MD ELECTRONICALLY SIGNED 08/13/2013 5:20

## 2014-11-18 NOTE — Discharge Summary (Signed)
PATIENT NAME:  Sharon [name], Sharon [name] MR#:  [id number] DATE OF BIRTH:  04/01/1916  DATE OF ADMISSION:  02/02/2012 DATE OF DISCHARGE:  02/04/2012  ADMITTING DIAGNOSIS: Dizziness.   DISCHARGE DIAGNOSES:  1. Dizziness due to symptomatic bradycardia, now dizziness resolved.  2. Symptomatic bradycardia due to a combination of metoprolol as well as her acute renal failure, mild hyperkalemia. Now her bradycardia is resolved. The patient is asymptomatic.  3. Acute renal failure on chronic renal failure, felt to be due to possibly overdiuresis. Her Zaroxolyn has been stopped, and her Lasix has been decreased. These can be adjusted by her primary physician as needed as an outpatient. Status post Nephrology evaluation.  4. Known coronary artery disease. Medical management has been recommended in the past which will be continued.  5. History of congestive heart failure. The patient is euvolemic, was actually given IV fluids during the hospitalization. Her echocardiogram shows a normal ejection fraction, some diastolic dysfunction.  6. Hypertension. She was on angiotensin II receptor blocker (ARB) and beta blockers which have been discontinued. Her blood pressure is currently stable even without these medications.  7. Hyperlipidemia.   8. History of cerebrovascular accident.  9. Hard of hearing.  10. Macular degeneration.   CONSULTANTS:  1. Dr. Mady Haagensen.  2. Dr. Julien Nordmann.   LABORATORY, DIAGNOSTIC AND RADIOLOGICAL DATA:  Urinalysis showed nitrites negative. Leukocyte was 1+, but WBC was only 13.  Troponin was 0.18. INR was 0.9. WBC 9.5, hemoglobin 11.6, platelet count 206.  Her BMP glucose was 143, BUN 68, creatinine was 2.97, potassium was 5.5.  PA and lateral chest x-ray shows mild congestive heart failure.  Her renal function today shows glucose 67, BUN 43, creatinine 2.07, sodium 142, potassium is 4.3.  Echocardiogram showed a normal ejection fraction with impaired LV relaxation.   HOSPITAL  COURSE: Please refer to History and Physical done by the admitting physician. The patient is a 81 year old Caucasian female who has a history of coronary artery disease, chronic renal failure, who presented with a complaint of dizziness and lightheadedness and felt "funny." The patient in the ED was noted to have a heart rate in the 20s. She also had a bout of hypotension. The patient on presentation was also noted to have hyperkalemia and acute renal failure. Due to these symptoms, we were asked to admit the patient. The patient was on an unusual dose of Toprol-XL 25 t.i.d. Her Toprol-XL was held, her diuresis with Zaroxolyn and Lasix were held. The patient was placed on monitor. She was seen in consultation by Cardiology. Her heart rate started to improve once she was taken off the beta blockers, and her renal function of her potassium resolved. It was felt that a likely combination of these caused her to have the bradycardia. It is possible that she may also have sick sinus syndrome, and if continues to experience these symptoms she may need a pacemaker. At this time, she has been ambulated and is doing well without any dizziness or symptoms. She is stable for discharge. The patient also was noted to have acute on chronic renal failure. She has stage IV chronic kidney disease. She was seen in consultation by Dr. Cherylann Ratel of Nephrology and was given IV fluids. Her diuresis was held. With these treatments, the  patient started to improve. Her renal function improved. At this point, she is doing much better and is stable for discharge.   DISCHARGE INSTRUCTIONS:  1. The patient is to weigh herself every day at the same time,  notify physician if greater than 2 pounds in one day or more than 5 pounds in one week. For increased weight gain, tiredness, swelling of the legs or feet, shortness of breath, urinating less frequently, feeling faint, chest pain or tightness call 911.  2. The patient is told to stop taking  metoprolol 25 mg, 1 tab p.o. t.i.d. until seen by Dr. Mariah Milling.  3. Stop Edarbi 40 mg daily and Zaroxolyn 10 mg daily.   DISCHARGE MEDICATIONS: She is to continue to take: 1. Ranexa 500, 1 tab p.o. b.i.d.  2. Aspirin 81 mg, 1 tab p.o. daily.  3. Omeprazole 20 mg daily.  4. Isosorbide mononitrate 90 mg daily.  5. Mirtazapine 7.5 mg at bedtime.  6. Vitamin D 250,000 international units daily.  7. Lantus 24 units subcutaneous at bedtime.  8.   0.25 mg, 1 tab q.i.d.  9. Atorvastatin 20 mg daily.  10. Lasix 40 once a day.   DISCHARGE DIET: Low sodium ADA diet.   ACTIVITY: As tolerated with a walker.   FOLLOWUP:  1. Follow up with Dr. Duncan Dull in 1 to 2 weeks.  2. Follow up with Dr. Mariah Milling in 2 to 4 weeks.  3. Follow up with Dr. Wynelle Link of Nephrology in 2 to 4 weeks.   TIME SPENT ON DISCHARGE:  35 minutes spent.   ____________________________ Lacie Scotts. Allena Katz, MD shp:cbb D: 02/04/2012 10:34:27 ET T: 02/04/2012 11:47:17 ET JOB#: [id number]  cc: Shakiya Mcneary H. Allena Katz, MD, <Dictator> Duncan Dull, MD Charise Carwin MD ELECTRONICALLY SIGNED 02/05/2012 17:52

## 2014-11-18 NOTE — H&P (Signed)
PATIENT NAME:  Sharon [name], Sharon [name] MR#:  [id number] DATE OF BIRTH:  09/12/1915  DATE OF ADMISSION:  02/02/2012  REFERRING PHYSICIAN: Dana Allan, MD    PRIMARY CARE PHYSICIAN: Duncan Dull, MD  NEPHROLOGIST: Lamont Dowdy, MD   CHIEF COMPLAINT: Dizziness.   HISTORY OF PRESENT ILLNESS: The patient is a pleasant 81 year old functional female who is accompanied here currently by her son, Mr. York Cerise. Hanrahan, III, cell phone [phone], who in conjunction with his sister is her POA. The patient stated that she was at her usual baseline. She was at church and having a videoconference with her friends from [place], which she had recently moved about six months ago. She suddenly found to be dizzy, with lightheadedness, and felt "funny. The patient felt presyncopal. She had no chest pains or palpitations or blurry vision. Here she was found to have significant bradycardia. Heart rate goes as low as 27. The patient did have a bout of hypotension with systolic in the 80s, however, has come back up to 110s. The patient denies having any chest pain. External pacers have been applied. The patient was also noted to have hyperkalemia with potassium of 5.5 and has been getting treatment for that as well as some glucagon. Currently. the heart rate jumps between 30s and 42.   Of note, the patient does have history of congestive heart failure and coronary artery disease. She has had a catheterization at [facility] in [place] 2 to 3 years ago, per son and patient. At that time, she was found to have three-vessel disease and was hospitalized for a coronary artery bypass graft. However, during hospitalization per son, she started to experience renal failure and the CABG was aborted. Medical management was recommended. She is on a metoprolol XL 25 mg in addition to other cardiac medications. She denies having taken extra doses of any of her medications. Hospitalist Services were contacted for further evaluation  and management.   PAST MEDICAL HISTORY:  1. Diabetes insulin-dependent. 2. Hypertension. 3. Hyperlipidemia. 4. History of cerebrovascular accident. 5. Hard of hearing.  6. Macular degeneration.  7. Congestive heart failure, unknown baseline. 8. Chronic kidney disease, possibly stage 4.  9. Coronary artery disease being medically managed.   PAST SURGICAL HISTORY: Multiple, however, the patient remembers bilateral knee surgeries.   ALLERGIES: Codeine and Levaquin.   MEDICATIONS:  1. Aspirin 81 mg daily.  2. Lipitor 20 mg daily.  3. Edarbi 40 mg daily.  4. Furosemide 80 mg in the morning. 5. Isosorbide mononitrate 60 mg, 1.5 tabs once a day. 6. Lantus 24 units daily.  7. Metoprolol succinate 25 mg t.i.d.  8. Mirtazapine 7.5 mg daily.  9. Omeprazole 20 mg daily.  10. Paxil 0.25 mg q.i.d.  11. Ranexa  500 mg b.i.d.    12. Vitamin D2 50,000 international units, one cap once a month.  13. Zaroxolyn 10 mg daily.   FAMILY HISTORY: Mom with hypertension. Father with chronic obstructive pulmonary. Brother with kidney problems.   SOCIAL HISTORY: The patient walks with a walker. No tobacco, alcohol or drug use. Currently lives with her son here.   REVIEW OF SYSTEMS: CONSTITUTIONAL: No fever felt, fatigued this morning. No weight changes. EYES:  No blurry vision. Has history of macular degeneration. ENT: No tinnitus. Has chronic hearing loss. RESPIRATORY: No cough, wheezing, has some shortness of breath today. No wheezing or chronic obstructive pulmonary disease. CARDIOVASCULAR: No chest pain, orthopnea. Has history of congestive heart failure. No dyspnea on exertion. Has varicose veins. GI:  No nausea, vomiting, diarrhea, abdominal pain. Has issues with gas. GENITOURINARY: No dysuria, hematuria, or incontinence. ENDOCRINE: No polyuria or nocturia causing. No thyroid problems. HEME/LYMPH: No anemia or easy bruising. SKIN: No new rashes. MUSCULOSKELETAL: Denies arthritis. NEUROLOGICAL: History of  CVA. PSYCHIATRIC: History of depression.   PHYSICAL EXAMINATION:  VITAL SIGNS: Temperature 96.5, pulse 39, respiratory rate 18, blood pressure 119/40, O2 saturation 94% on room air.   GENERAL: The patient is a pleasant Caucasian female lying in bed in no obvious distress, talking in full sentences.   HEENT: Normocephalic, atraumatic. Pupils are equal and reactive. Anicteric sclerae. Moist mucous membranes. Extraocular muscles intact.   NECK: Supple. No thyroid tenderness. No cervical lymphadenopathy.   CARDIOVASCULAR: S1, S2 bradycardic. No murmurs appreciated.   LUNGS: Clear to auscultation without wheezing.   ABDOMEN: Soft, nondistended, nontender, positive bowel sounds in all quadrants.   EXTREMITIES: No significant lower extremity edema. Some varicose veins.   SKIN: No obvious rashes.   NEUROLOGICAL: Cranial nerves II through XII are intact are grossly intact. Strength is 5 out of 5 all extremities. Sensation is intact to light touch.   PSYCHIATRIC: Awake, alert, oriented x3. Pleasant, conversant.   LABORATORY, DIAGNOSTIC AND RADIOLOGICAL DATA:  BUN 68, creatinine 2.97, sodium is 135, potassium 5.5. GFR of 13. Anion gap of 5, albumin 3.3, otherwise LFTs within normal limits.  Troponin 0.18. CK-MB 1.5. CK total 555. Hemoglobin 11.6. WBC 9.5.  INR is 0.9.  EKG: Wide QRS, I do see a couple of T waves here, rates 42, some PVC. Left bundle branch. No acute ST elevations or depressions.  Chest x-ray, PA and lateral, showing some findings consistent with congestive heart failure and pulmonary interstitial edema.  ASSESSMENT AND PLAN: We have a pleasant 81 year old female with history of congestive heart failure, unknown if it is systolic or diastolic, chronic kidney disease,  possibly stage 4  per description of her son, diabetes, hypertension, hyperlipidemia, who is being managed by Dr. Darrick Huntsman and Dr. Wynelle Link as an outpatient. She does not have a cardiologist. She currently has  symptomatic bradycardia. While I was in the room interviewing the patient, I would see periods of P waves which then would turn into a junctional without any P waves. At this point, she has been getting treatment with glucagon as well as treatment for the hyperkalemia of 5.5. There is no previous EKG in the chart. I saw  the patient is on a beta blocker, but she takes it 3 times a day, an extended release formulation. She has been having dizziness and presyncopal. At this point, we would transfer the patient to the Critical Care Unit. External pacer patches have been applied. I would hold the beta blocker, the ARB and the Imdur as she did have a bout of hypotension. The case has been discussed with Dr. Mariah Milling. At this point,  if the patient has further symptoms of hypertension or otherwise, we can potentially start low dose dopamine. We would, of course, hold the beta blockers. Hyperkalemia can also cause wide QRS and bradycardia. I would repeat a BMP for a potassium check in several hours and recheck in the morning. I would also consult with Cardiology. The patient also has renal failure, but per description of the son, who stated that it is about 20% function, possibly it is at her baseline. She is being managed by Dr. Wynelle Link as an outpatient. We will attempt to get outpatient records and get a consult with Dr. Wynelle Link. .   The patient  does have a history of congestive heart failure but appears to be euvolemic, although there is some fluid in the lungs. The patient denies orthopnea and has no significant crackles and no lower extremity edema. We would resume the outpatient medications of Lasix and Zaroxolyn at this point. We would decrease the dose of Lantus for now to prevent any hypoglycemia, and start the patient on a  sliding scale, and check a hemoglobin A1c.    CODE STATUS: The patient is DO NOT RESUSCITATE.   TOTAL TIME SPENT: 65 minutes.   The case has discussed with the patient, her son who is  the Power of [place], and  Dr. Mariah Milling.  ____________________________ Krystal Eaton, MD sa:cbb D: 02/02/2012 15:16:42 ET T: 02/02/2012 15:42:48 ET JOB#: [id number] cc: Krystal Eaton, MD, <Dictator> Duncan Dull, MD Krystal Eaton MD ELECTRONICALLY SIGNED 02/17/2012 12:23

## 2014-11-18 NOTE — Consult Note (Signed)
General Aspect 81 year old female with CAD, durned down for CABG based on renal dysfunction, presenting with dizziness, lightheadedness, and felt "funny. Cardiology was consulted for bradycardia.  She reports feeling  presyncopal. She had no chest pains or palpitations or blurry vision.  The patient denies having any chest pain.   In the ER, she was found to have significant bradycardia, junctional rhythm with rates in the 20s to 30s. The patient did have a bout of hypotension with systolic in the 56E,   potassium of 5.5, worsening renal failure.  She is on a metoprolol XL 25 mg in addition to other cardiac medications. She denies having taken extra doses of any of her medications.  History of congestive heart failure and coronary artery disease. She has had a catheterization at [facility] in [place] 2 to 3 years ago, per son and patient. At that time, she was found to have three-vessel disease and was hospitalized for a coronary artery bypass graft. However, during hospitalization per son, she started to experience renal failure and the CABG was aborted. Medical management was recommended.    Present Illness . FAMILY HISTORY: Mom with hypertension. Father with chronic obstructive pulmonary. Brother with kidney problems.   SOCIAL HISTORY: The patient walks with a walker. No tobacco, alcohol or drug use. Currently lives with her son here.   ECHO 10/2011 - Left ventricle: The cavity size was normal. Wall thickness   was increased in a pattern of moderate LVH. Systolic   function was normal. The estimated ejection fraction was   in the range of 55% to 65%. Wall motion was normal; there   were no regional wall motion abnormalities. Doppler   parameters are consistent with abnormal left ventricular   relaxation (grade 1 diastolic dysfunction). Elevated E/E'   ratio is suggestive of elevated filling pressures. - Mitral valve: Mild to moderate regurgitation. - Left atrium: The atrium was  mildly dilated. - Pulmonary arteries: Systolic pressure was mildly   increased. PA peak pressure: 59m Hg (S). There is   evidence of mild pulmonary hypertension.   Physical Exam:   GEN well developed, well nourished, no acute distress    HEENT red conjunctivae    NECK supple  No masses    RESP normal resp effort  clear BS    CARD Regular rate and rhythm  No murmur    ABD denies tenderness  soft    LYMPH negative neck    SKIN normal to palpation    NEURO motor/sensory function intact    PSYCH alert, A+O to time, place, person, good insight   Review of Systems:   Subjective/Chief Complaint weak, malaise    General: Weakness    Skin: No Complaints    ENT: No Complaints    Eyes: No Complaints    Neck: No Complaints    Respiratory: No Complaints    Cardiovascular: No Complaints    Gastrointestinal: No Complaints    Genitourinary: No Complaints    Vascular: No Complaints    Musculoskeletal: No Complaints    Neurologic: No Complaints    Hematologic: No Complaints    Endocrine: No Complaints    Psychiatric: No Complaints    Review of Systems: All other systems were reviewed and found to be negative    Medications/Allergies Reviewed Medications/Allergies reviewed   Home Medications: Medication Instructions Status  Ranexa 500 mg oral tablet, extended release 1 tab(s) orally 2 times a day Active  aspirin 81 mg oral tablet 1 tab(s) orally  once a day Active  metoprolol succinate 25 mg oral tablet, extended release 1 tab(s) orally 3 times a day Active  omeprazole 20 mg oral delayed release capsule 1 cap(s) orally once a day Active  Edarbi 40 mg oral tablet 1 tab(s) orally once a day Active  atorvastatin 20 mg oral tablet 1 tab(s) orally once a day Active  furosemide 40 mg oral tablet 2 tab(s) orally once a day Active  isosorbide mononitrate 60 mg oral tablet, extended release 1.5 tab(s) orally once a day (in the morning) Active  mirtazapine 7.5 mg oral  tablet 1 tab(s) orally once a day Active  Vitamin D2 50,000 intl units (1.25 mg) oral capsule 1 cap(s) orally once a month Active  Lantus 100 units/mL subcutaneous solution 24 unit(s) subcutaneous once a day (at bedtime) Active  Zaroxolyn 10 mg oral tablet 1 tab(s) orally once a day Active  pramipexole 0.25 mg oral tablet 1 tab(s) orally 4 times a day Active   Lab Results:  Hepatic:  09-Jul-13 13:01    Bilirubin, Total 0.4   Alkaline Phosphatase 114   SGPT (ALT) 21 (12-78 NOTE: NEW REFERENCE RANGE 06/19/2011)   SGOT (AST) 20   Total Protein, Serum 6.7   Albumin, Serum  3.3  Routine Chem:  09-Jul-13 13:01    Glucose, Serum  143   BUN  68   Creatinine (comp)  2.97   Sodium, Serum  135   Potassium, Serum  5.5   Chloride, Serum 104   CO2, Serum 26   Calcium (Total), Serum 8.8   Osmolality (calc) 292   eGFR (African American)  15   eGFR (Non-African American)  13 (eGFR values <7m/min/1.73 m2 may be an indication of chronic kidney disease (CKD). Calculated eGFR is useful in patients with stable renal function. The eGFR calculation will not be reliable in acutely ill patients when serum creatinine is changing rapidly. It is not useful in  patients on dialysis. The eGFR calculation may not be applicable to patients at the low and high extremes of body sizes, pregnant women, and vegetarians.)   Anion Gap  5   Result Comment TROPONIN - RESULTS VERIFIED BY REPEAT TESTING.  - HP TO DR POWER@1405 ,02/02/12  - READ-BACK PROCESS PERFORMED.  Result(s) reported on 02 Feb 2012 at 02:06PM.  Cardiac:  09-Jul-13 13:01    CK, Total 55   CPK-MB, Serum 1.5 (Result(s) reported on 02 Feb 2012 at 02:01PM.)   Troponin I  0.18 (0.00-0.05 0.05 ng/mL or less: NEGATIVE  Repeat testing in 3-6 hrs  if clinically indicated. >0.05 ng/mL: POTENTIAL  MYOCARDIAL INJURY. Repeat  testing in 3-6 hrs if  clinically indicated. NOTE: An increase or decrease  of 30% or more on serial  testing suggests a   clinically important change)  Routine UA:  09-Jul-13 13:01    Color (UA) Amber   Clarity (UA) Hazy   Glucose (UA) Negative   Bilirubin (UA) Negative   Ketones (UA) Negative   Specific Gravity (UA) 1.011   Blood (UA) Negative   pH (UA) 5.0   Protein (UA) Negative   Nitrite (UA) Negative   Leukocyte Esterase (UA) 1+ (Result(s) reported on 02 Feb 2012 at 04:09PM.)   RBC (UA) 2 /HPF   WBC (UA) 13 /HPF   Bacteria (UA) NONE SEEN   Epithelial Cells (UA) 2 /HPF   Mucous (UA) PRESENT   Hyaline Cast (UA) 9 /LPF (Result(s) reported on 02 Feb 2012 at 04:09PM.)  Routine Coag:  09-Jul-13 13:01  Activated PTT (APTT) 24.0 (A HCT value >55% may artifactually increase the APTT. In one study, the increase was an average of 19%. Reference: "Effect on Routine and Special Coagulation Testing Values of Citrate Anticoagulant Adjustment in Patients with High HCT Values." American Journal of Clinical Pathology 2006;126:400-405.)   Prothrombin 12.8   INR 0.9 (INR reference interval applies to patients on anticoagulant therapy. A single INR therapeutic range for coumarins is not optimal for all indications; however, the suggested range for most indications is 2.0 - 3.0. Exceptions to the INR Reference Range may include: Prosthetic heart valves, acute myocardial infarction, prevention of myocardial infarction, and combinations of aspirin and anticoagulant. The need for a higher or lower target INR must be assessed individually. Reference: The Pharmacology and Management of the Vitamin K  antagonists: the seventh ACCP Conference on Antithrombotic and Thrombolytic Therapy. FOYDX.4128 Sept:126 (3suppl): [id number]. A HCT value >55% may artifactually increase the PT.  In one study,  the increase was an average of 25%. Reference:  "Effect on Routine and Special Coagulation Testing Values of Citrate Anticoagulant Adjustment in Patients with High HCT Values." American Journal of Clinical Pathology  2006;126:400-405.)  Routine Hem:  09-Jul-13 13:01    WBC (CBC) 9.5   RBC (CBC)  3.78   Hemoglobin (CBC)  11.6   Hematocrit (CBC) 35.1   Platelet Count (CBC) 206 (Result(s) reported on 02 Feb 2012 at 01:33PM.)   MCV 93   MCH 30.7   MCHC 33.1   RDW  14.7   EKG:   Interpretation EKG with junctional escapr rhythm, rate 42 bpm, PVCs noted, Periods of sinus bradycardia    Codeine: Unknown  Levaquin: Unknown  Vital Signs/Nurse's Notes: **Vital Signs.:   09-Jul-13 17:00   Pulse Pulse 56   Respirations Respirations 23   Systolic BP Systolic BP 786   Diastolic BP (mmHg) Diastolic BP (mmHg) 56   Mean BP 88   Pulse Ox % Pulse Ox % 98   Pulse Ox Activity Level  At rest   Oxygen Delivery Room Air/ 21 %   Pulse Ox Heart Rate 53     Impression 81 year old female with CAD, Turned down for CABG based on renal dysfunction, presenting with dizziness, lightheadedness, and felt "funny. Bradycardia, junctional rhythm.  Worsening renal failure, hyperkalemia  1) Arrhythmia/junctional rhythm/pauses Susopect secondary to hyperkalemia/renal dyusfunction from overdiuresis. She is on high dose lasix and metolazone Recent echo 10/2011 suggested only mild fluid overload --Will hold lasix/metolazone, start NS at 125 cc/hr -Hold metoprolol -Monitor on tele for now  2) Renal dysfunction: Suspect overdiuresis Baseline creatinine last year 1.9, then 2.0 in 10/2011 Now 2.9, BUN 70 Prior to recent diuetic changes, she was take lasix 40 mg daily. Diuretic was increased after developing edema/weight gain  from amlodipine   3) CAD: no angina Borderline elevated troponin in setting of dehydration Continue asa and statin, Hold metoprolol for now  4)Diabetes: on insulin   Electronic Signatures: Ida Rogue (MD)  (Signed 09-Jul-13 18:24)  Authored: General Aspect/Present Illness, History and Physical Exam, Review of System, Home Medications, Labs, EKG , Allergies, Vital Signs/Nurse's Notes,  Impression/Plan   Last Updated: 09-Jul-13 18:24 by Ida Rogue (MD)
